# Patient Record
Sex: Female | Born: 1935
Health system: Southern US, Community
[De-identification: ages and names within clinical notes are randomized; demographics above are authoritative.]

## PROBLEM LIST (undated history)

## (undated) DIAGNOSIS — I10 Essential (primary) hypertension: Secondary | ICD-10-CM

## (undated) DIAGNOSIS — E119 Type 2 diabetes mellitus without complications: Secondary | ICD-10-CM

## (undated) DIAGNOSIS — I4891 Unspecified atrial fibrillation: Secondary | ICD-10-CM

## (undated) DIAGNOSIS — Z950 Presence of cardiac pacemaker: Secondary | ICD-10-CM

## (undated) DIAGNOSIS — IMO0002 Reserved for concepts with insufficient information to code with codable children: Secondary | ICD-10-CM

## (undated) DIAGNOSIS — E785 Hyperlipidemia, unspecified: Secondary | ICD-10-CM

## (undated) DIAGNOSIS — I2510 Atherosclerotic heart disease of native coronary artery without angina pectoris: Secondary | ICD-10-CM

## (undated) HISTORY — PX: BACK SURGERY: SHX140

## (undated) HISTORY — DX: Hyperlipidemia, unspecified: E78.5

## (undated) HISTORY — DX: Atherosclerotic heart disease of native coronary artery without angina pectoris: I25.10

## (undated) HISTORY — PX: CARDIAC SURGERY: SHX584

---

## 1960-06-27 HISTORY — PX: MITRAL VALVE REPLACEMENT: SHX147

## 1992-06-27 HISTORY — PX: AORTIC AND MITRAL VALVE REPLACEMENT: SHX5056

## 1998-06-27 HISTORY — PX: PACEMAKER INSERTION: SHX728

## 2009-06-27 HISTORY — PX: KYPHOPLASTY: SHX5884

## 2010-06-27 HISTORY — PX: KYPHOPLASTY: SHX5884

## 2011-06-28 HISTORY — PX: PACEMAKER GENERATOR CHANGE: SHX5998

## 2011-06-28 HISTORY — PX: KYPHOPLASTY: SHX5884

## 2014-07-04 DIAGNOSIS — Z7901 Long term (current) use of anticoagulants: Secondary | ICD-10-CM | POA: Diagnosis not present

## 2014-07-17 DIAGNOSIS — Z7901 Long term (current) use of anticoagulants: Secondary | ICD-10-CM | POA: Diagnosis not present

## 2014-08-01 DIAGNOSIS — Z7901 Long term (current) use of anticoagulants: Secondary | ICD-10-CM | POA: Diagnosis not present

## 2014-08-13 DIAGNOSIS — Z1231 Encounter for screening mammogram for malignant neoplasm of breast: Secondary | ICD-10-CM | POA: Diagnosis not present

## 2014-08-18 DIAGNOSIS — Z7901 Long term (current) use of anticoagulants: Secondary | ICD-10-CM | POA: Diagnosis not present

## 2014-08-22 DIAGNOSIS — H6123 Impacted cerumen, bilateral: Secondary | ICD-10-CM | POA: Diagnosis not present

## 2014-08-22 DIAGNOSIS — H903 Sensorineural hearing loss, bilateral: Secondary | ICD-10-CM | POA: Diagnosis not present

## 2014-08-28 DIAGNOSIS — Z7901 Long term (current) use of anticoagulants: Secondary | ICD-10-CM | POA: Diagnosis not present

## 2014-09-05 DIAGNOSIS — N390 Urinary tract infection, site not specified: Secondary | ICD-10-CM | POA: Diagnosis not present

## 2014-09-05 DIAGNOSIS — N813 Complete uterovaginal prolapse: Secondary | ICD-10-CM | POA: Diagnosis not present

## 2014-09-05 DIAGNOSIS — N3000 Acute cystitis without hematuria: Secondary | ICD-10-CM | POA: Diagnosis not present

## 2014-09-11 DIAGNOSIS — Z7901 Long term (current) use of anticoagulants: Secondary | ICD-10-CM | POA: Diagnosis not present

## 2014-09-14 DIAGNOSIS — Z79899 Other long term (current) drug therapy: Secondary | ICD-10-CM | POA: Diagnosis not present

## 2014-09-14 DIAGNOSIS — E119 Type 2 diabetes mellitus without complications: Secondary | ICD-10-CM | POA: Diagnosis not present

## 2014-09-14 DIAGNOSIS — I10 Essential (primary) hypertension: Secondary | ICD-10-CM | POA: Diagnosis not present

## 2014-09-14 DIAGNOSIS — E780 Pure hypercholesterolemia: Secondary | ICD-10-CM | POA: Diagnosis not present

## 2014-09-14 DIAGNOSIS — N390 Urinary tract infection, site not specified: Secondary | ICD-10-CM | POA: Diagnosis not present

## 2014-09-17 DIAGNOSIS — H25811 Combined forms of age-related cataract, right eye: Secondary | ICD-10-CM | POA: Diagnosis not present

## 2014-09-17 DIAGNOSIS — H2512 Age-related nuclear cataract, left eye: Secondary | ICD-10-CM | POA: Diagnosis not present

## 2014-09-24 DIAGNOSIS — Z7901 Long term (current) use of anticoagulants: Secondary | ICD-10-CM | POA: Diagnosis not present

## 2014-10-09 DIAGNOSIS — Z7901 Long term (current) use of anticoagulants: Secondary | ICD-10-CM | POA: Diagnosis not present

## 2014-10-17 DIAGNOSIS — Z7901 Long term (current) use of anticoagulants: Secondary | ICD-10-CM | POA: Diagnosis not present

## 2014-10-29 DIAGNOSIS — H2513 Age-related nuclear cataract, bilateral: Secondary | ICD-10-CM | POA: Diagnosis not present

## 2014-10-29 DIAGNOSIS — H2511 Age-related nuclear cataract, right eye: Secondary | ICD-10-CM | POA: Diagnosis not present

## 2014-10-29 DIAGNOSIS — H2512 Age-related nuclear cataract, left eye: Secondary | ICD-10-CM | POA: Diagnosis not present

## 2014-11-03 DIAGNOSIS — Z950 Presence of cardiac pacemaker: Secondary | ICD-10-CM | POA: Diagnosis not present

## 2014-11-03 DIAGNOSIS — Z45010 Encounter for checking and testing of cardiac pacemaker pulse generator [battery]: Secondary | ICD-10-CM | POA: Diagnosis not present

## 2014-11-10 DIAGNOSIS — Z7901 Long term (current) use of anticoagulants: Secondary | ICD-10-CM | POA: Diagnosis not present

## 2014-11-12 DIAGNOSIS — H2511 Age-related nuclear cataract, right eye: Secondary | ICD-10-CM | POA: Diagnosis not present

## 2014-12-03 DIAGNOSIS — H2512 Age-related nuclear cataract, left eye: Secondary | ICD-10-CM | POA: Diagnosis not present

## 2014-12-08 DIAGNOSIS — F329 Major depressive disorder, single episode, unspecified: Secondary | ICD-10-CM | POA: Diagnosis not present

## 2014-12-08 DIAGNOSIS — E119 Type 2 diabetes mellitus without complications: Secondary | ICD-10-CM | POA: Diagnosis not present

## 2014-12-08 DIAGNOSIS — I10 Essential (primary) hypertension: Secondary | ICD-10-CM | POA: Diagnosis not present

## 2014-12-08 DIAGNOSIS — E785 Hyperlipidemia, unspecified: Secondary | ICD-10-CM | POA: Diagnosis not present

## 2014-12-08 DIAGNOSIS — I4891 Unspecified atrial fibrillation: Secondary | ICD-10-CM | POA: Diagnosis not present

## 2014-12-08 DIAGNOSIS — R5383 Other fatigue: Secondary | ICD-10-CM | POA: Diagnosis not present

## 2014-12-16 DIAGNOSIS — N813 Complete uterovaginal prolapse: Secondary | ICD-10-CM | POA: Diagnosis not present

## 2014-12-24 DIAGNOSIS — H35372 Puckering of macula, left eye: Secondary | ICD-10-CM | POA: Diagnosis not present

## 2014-12-30 DIAGNOSIS — I4891 Unspecified atrial fibrillation: Secondary | ICD-10-CM | POA: Diagnosis not present

## 2015-01-06 DIAGNOSIS — H3531 Nonexudative age-related macular degeneration: Secondary | ICD-10-CM | POA: Diagnosis not present

## 2015-01-06 DIAGNOSIS — E119 Type 2 diabetes mellitus without complications: Secondary | ICD-10-CM | POA: Diagnosis not present

## 2015-01-06 DIAGNOSIS — H35372 Puckering of macula, left eye: Secondary | ICD-10-CM | POA: Diagnosis not present

## 2015-01-21 DIAGNOSIS — I4891 Unspecified atrial fibrillation: Secondary | ICD-10-CM | POA: Diagnosis not present

## 2015-02-19 DIAGNOSIS — Z45018 Encounter for adjustment and management of other part of cardiac pacemaker: Secondary | ICD-10-CM | POA: Diagnosis not present

## 2015-02-19 DIAGNOSIS — I480 Paroxysmal atrial fibrillation: Secondary | ICD-10-CM | POA: Diagnosis not present

## 2015-02-19 DIAGNOSIS — Z45010 Encounter for checking and testing of cardiac pacemaker pulse generator [battery]: Secondary | ICD-10-CM | POA: Diagnosis not present

## 2015-03-09 DIAGNOSIS — R5383 Other fatigue: Secondary | ICD-10-CM | POA: Diagnosis not present

## 2015-03-09 DIAGNOSIS — I38 Endocarditis, valve unspecified: Secondary | ICD-10-CM | POA: Diagnosis not present

## 2015-03-09 DIAGNOSIS — E119 Type 2 diabetes mellitus without complications: Secondary | ICD-10-CM | POA: Diagnosis not present

## 2015-03-09 DIAGNOSIS — R42 Dizziness and giddiness: Secondary | ICD-10-CM | POA: Diagnosis not present

## 2015-03-09 DIAGNOSIS — Z7901 Long term (current) use of anticoagulants: Secondary | ICD-10-CM | POA: Diagnosis not present

## 2015-03-09 DIAGNOSIS — I10 Essential (primary) hypertension: Secondary | ICD-10-CM | POA: Diagnosis not present

## 2015-04-14 DIAGNOSIS — I4891 Unspecified atrial fibrillation: Secondary | ICD-10-CM | POA: Diagnosis not present

## 2015-04-14 DIAGNOSIS — N813 Complete uterovaginal prolapse: Secondary | ICD-10-CM | POA: Diagnosis not present

## 2015-05-04 DIAGNOSIS — N813 Complete uterovaginal prolapse: Secondary | ICD-10-CM | POA: Diagnosis not present

## 2015-05-08 DIAGNOSIS — Z7901 Long term (current) use of anticoagulants: Secondary | ICD-10-CM | POA: Diagnosis not present

## 2015-05-25 DIAGNOSIS — I4891 Unspecified atrial fibrillation: Secondary | ICD-10-CM | POA: Diagnosis not present

## 2015-05-28 HISTORY — PX: KYPHOPLASTY: SHX5884

## 2015-06-02 DIAGNOSIS — M5134 Other intervertebral disc degeneration, thoracic region: Secondary | ICD-10-CM | POA: Diagnosis not present

## 2015-06-02 DIAGNOSIS — M47816 Spondylosis without myelopathy or radiculopathy, lumbar region: Secondary | ICD-10-CM | POA: Diagnosis not present

## 2015-06-02 DIAGNOSIS — M549 Dorsalgia, unspecified: Secondary | ICD-10-CM | POA: Diagnosis not present

## 2015-06-02 DIAGNOSIS — M40209 Unspecified kyphosis, site unspecified: Secondary | ICD-10-CM | POA: Diagnosis not present

## 2015-06-02 DIAGNOSIS — M545 Low back pain: Secondary | ICD-10-CM | POA: Diagnosis not present

## 2015-06-02 DIAGNOSIS — S32050A Wedge compression fracture of fifth lumbar vertebra, initial encounter for closed fracture: Secondary | ICD-10-CM | POA: Diagnosis not present

## 2015-06-02 DIAGNOSIS — M4854XA Collapsed vertebra, not elsewhere classified, thoracic region, initial encounter for fracture: Secondary | ICD-10-CM | POA: Diagnosis not present

## 2015-06-02 DIAGNOSIS — I5033 Acute on chronic diastolic (congestive) heart failure: Secondary | ICD-10-CM | POA: Diagnosis not present

## 2015-06-03 DIAGNOSIS — I5033 Acute on chronic diastolic (congestive) heart failure: Secondary | ICD-10-CM | POA: Diagnosis present

## 2015-06-03 DIAGNOSIS — Z7984 Long term (current) use of oral hypoglycemic drugs: Secondary | ICD-10-CM | POA: Diagnosis not present

## 2015-06-03 DIAGNOSIS — R791 Abnormal coagulation profile: Secondary | ICD-10-CM | POA: Diagnosis present

## 2015-06-03 DIAGNOSIS — S32050A Wedge compression fracture of fifth lumbar vertebra, initial encounter for closed fracture: Secondary | ICD-10-CM | POA: Diagnosis not present

## 2015-06-03 DIAGNOSIS — Z952 Presence of prosthetic heart valve: Secondary | ICD-10-CM | POA: Diagnosis not present

## 2015-06-03 DIAGNOSIS — Z950 Presence of cardiac pacemaker: Secondary | ICD-10-CM | POA: Diagnosis not present

## 2015-06-03 DIAGNOSIS — E785 Hyperlipidemia, unspecified: Secondary | ICD-10-CM | POA: Diagnosis present

## 2015-06-03 DIAGNOSIS — M810 Age-related osteoporosis without current pathological fracture: Secondary | ICD-10-CM | POA: Diagnosis present

## 2015-06-03 DIAGNOSIS — E119 Type 2 diabetes mellitus without complications: Secondary | ICD-10-CM | POA: Diagnosis present

## 2015-06-03 DIAGNOSIS — Z87310 Personal history of (healed) osteoporosis fracture: Secondary | ICD-10-CM | POA: Diagnosis not present

## 2015-06-03 DIAGNOSIS — I10 Essential (primary) hypertension: Secondary | ICD-10-CM | POA: Diagnosis present

## 2015-06-03 DIAGNOSIS — I272 Other secondary pulmonary hypertension: Secondary | ICD-10-CM | POA: Diagnosis present

## 2015-06-03 DIAGNOSIS — M549 Dorsalgia, unspecified: Secondary | ICD-10-CM | POA: Diagnosis not present

## 2015-06-03 DIAGNOSIS — I517 Cardiomegaly: Secondary | ICD-10-CM | POA: Diagnosis not present

## 2015-06-03 DIAGNOSIS — I482 Chronic atrial fibrillation: Secondary | ICD-10-CM | POA: Diagnosis present

## 2015-06-03 DIAGNOSIS — Z23 Encounter for immunization: Secondary | ICD-10-CM | POA: Diagnosis not present

## 2015-06-03 DIAGNOSIS — Z7901 Long term (current) use of anticoagulants: Secondary | ICD-10-CM | POA: Diagnosis not present

## 2015-06-03 DIAGNOSIS — R0602 Shortness of breath: Secondary | ICD-10-CM | POA: Diagnosis not present

## 2015-06-03 DIAGNOSIS — F419 Anxiety disorder, unspecified: Secondary | ICD-10-CM | POA: Diagnosis present

## 2015-06-03 DIAGNOSIS — M545 Low back pain: Secondary | ICD-10-CM | POA: Diagnosis not present

## 2015-06-09 DIAGNOSIS — E785 Hyperlipidemia, unspecified: Secondary | ICD-10-CM | POA: Diagnosis not present

## 2015-06-09 DIAGNOSIS — I4891 Unspecified atrial fibrillation: Secondary | ICD-10-CM | POA: Diagnosis not present

## 2015-06-09 DIAGNOSIS — G8929 Other chronic pain: Secondary | ICD-10-CM | POA: Diagnosis not present

## 2015-06-09 DIAGNOSIS — Z952 Presence of prosthetic heart valve: Secondary | ICD-10-CM | POA: Diagnosis not present

## 2015-06-09 DIAGNOSIS — I10 Essential (primary) hypertension: Secondary | ICD-10-CM | POA: Diagnosis not present

## 2015-06-09 DIAGNOSIS — E119 Type 2 diabetes mellitus without complications: Secondary | ICD-10-CM | POA: Diagnosis not present

## 2015-06-11 DIAGNOSIS — M4856XA Collapsed vertebra, not elsewhere classified, lumbar region, initial encounter for fracture: Secondary | ICD-10-CM | POA: Diagnosis not present

## 2015-06-11 DIAGNOSIS — T192XXA Foreign body in vulva and vagina, initial encounter: Secondary | ICD-10-CM | POA: Diagnosis not present

## 2015-06-11 DIAGNOSIS — R52 Pain, unspecified: Secondary | ICD-10-CM | POA: Diagnosis not present

## 2015-06-11 DIAGNOSIS — K5900 Constipation, unspecified: Secondary | ICD-10-CM | POA: Diagnosis not present

## 2015-06-11 DIAGNOSIS — R109 Unspecified abdominal pain: Secondary | ICD-10-CM | POA: Diagnosis not present

## 2015-06-12 DIAGNOSIS — M19032 Primary osteoarthritis, left wrist: Secondary | ICD-10-CM | POA: Diagnosis not present

## 2015-06-12 DIAGNOSIS — I4891 Unspecified atrial fibrillation: Secondary | ICD-10-CM | POA: Diagnosis present

## 2015-06-12 DIAGNOSIS — Z7901 Long term (current) use of anticoagulants: Secondary | ICD-10-CM | POA: Diagnosis not present

## 2015-06-12 DIAGNOSIS — K5909 Other constipation: Secondary | ICD-10-CM | POA: Diagnosis not present

## 2015-06-12 DIAGNOSIS — G8929 Other chronic pain: Secondary | ICD-10-CM | POA: Diagnosis present

## 2015-06-12 DIAGNOSIS — S32030A Wedge compression fracture of third lumbar vertebra, initial encounter for closed fracture: Secondary | ICD-10-CM | POA: Diagnosis not present

## 2015-06-12 DIAGNOSIS — I10 Essential (primary) hypertension: Secondary | ICD-10-CM | POA: Diagnosis present

## 2015-06-12 DIAGNOSIS — M19011 Primary osteoarthritis, right shoulder: Secondary | ICD-10-CM | POA: Diagnosis not present

## 2015-06-12 DIAGNOSIS — M8008XA Age-related osteoporosis with current pathological fracture, vertebra(e), initial encounter for fracture: Secondary | ICD-10-CM | POA: Diagnosis not present

## 2015-06-12 DIAGNOSIS — Z950 Presence of cardiac pacemaker: Secondary | ICD-10-CM | POA: Diagnosis not present

## 2015-06-12 DIAGNOSIS — M19012 Primary osteoarthritis, left shoulder: Secondary | ICD-10-CM | POA: Diagnosis not present

## 2015-06-12 DIAGNOSIS — M545 Low back pain: Secondary | ICD-10-CM | POA: Diagnosis present

## 2015-06-12 DIAGNOSIS — R109 Unspecified abdominal pain: Secondary | ICD-10-CM | POA: Diagnosis present

## 2015-06-12 DIAGNOSIS — I2510 Atherosclerotic heart disease of native coronary artery without angina pectoris: Secondary | ICD-10-CM | POA: Diagnosis present

## 2015-06-12 DIAGNOSIS — M4856XA Collapsed vertebra, not elsewhere classified, lumbar region, initial encounter for fracture: Secondary | ICD-10-CM | POA: Diagnosis not present

## 2015-06-12 DIAGNOSIS — K8689 Other specified diseases of pancreas: Secondary | ICD-10-CM | POA: Diagnosis not present

## 2015-06-12 DIAGNOSIS — K828 Other specified diseases of gallbladder: Secondary | ICD-10-CM | POA: Diagnosis not present

## 2015-06-12 DIAGNOSIS — N179 Acute kidney failure, unspecified: Secondary | ICD-10-CM | POA: Diagnosis not present

## 2015-06-12 DIAGNOSIS — Z952 Presence of prosthetic heart valve: Secondary | ICD-10-CM | POA: Diagnosis not present

## 2015-06-12 DIAGNOSIS — T192XXA Foreign body in vulva and vagina, initial encounter: Secondary | ICD-10-CM | POA: Diagnosis not present

## 2015-06-12 DIAGNOSIS — E119 Type 2 diabetes mellitus without complications: Secondary | ICD-10-CM | POA: Diagnosis not present

## 2015-06-12 DIAGNOSIS — S32040G Wedge compression fracture of fourth lumbar vertebra, subsequent encounter for fracture with delayed healing: Secondary | ICD-10-CM | POA: Diagnosis not present

## 2015-06-12 DIAGNOSIS — E785 Hyperlipidemia, unspecified: Secondary | ICD-10-CM | POA: Diagnosis present

## 2015-06-12 DIAGNOSIS — I959 Hypotension, unspecified: Secondary | ICD-10-CM | POA: Diagnosis present

## 2015-06-12 DIAGNOSIS — Z7984 Long term (current) use of oral hypoglycemic drugs: Secondary | ICD-10-CM | POA: Diagnosis not present

## 2015-06-12 DIAGNOSIS — K5900 Constipation, unspecified: Secondary | ICD-10-CM | POA: Diagnosis not present

## 2015-06-24 DIAGNOSIS — Z952 Presence of prosthetic heart valve: Secondary | ICD-10-CM | POA: Diagnosis not present

## 2015-06-24 DIAGNOSIS — I10 Essential (primary) hypertension: Secondary | ICD-10-CM | POA: Diagnosis not present

## 2015-06-24 DIAGNOSIS — J00 Acute nasopharyngitis [common cold]: Secondary | ICD-10-CM | POA: Diagnosis not present

## 2015-06-24 DIAGNOSIS — E119 Type 2 diabetes mellitus without complications: Secondary | ICD-10-CM | POA: Diagnosis not present

## 2015-06-24 DIAGNOSIS — M810 Age-related osteoporosis without current pathological fracture: Secondary | ICD-10-CM | POA: Diagnosis not present

## 2015-06-24 DIAGNOSIS — F329 Major depressive disorder, single episode, unspecified: Secondary | ICD-10-CM | POA: Diagnosis not present

## 2015-06-24 DIAGNOSIS — I4891 Unspecified atrial fibrillation: Secondary | ICD-10-CM | POA: Diagnosis not present

## 2015-07-02 DIAGNOSIS — I4891 Unspecified atrial fibrillation: Secondary | ICD-10-CM | POA: Diagnosis not present

## 2015-07-02 DIAGNOSIS — R634 Abnormal weight loss: Secondary | ICD-10-CM | POA: Diagnosis not present

## 2015-07-02 DIAGNOSIS — T8189XA Other complications of procedures, not elsewhere classified, initial encounter: Secondary | ICD-10-CM | POA: Diagnosis not present

## 2015-07-02 DIAGNOSIS — E1169 Type 2 diabetes mellitus with other specified complication: Secondary | ICD-10-CM | POA: Diagnosis not present

## 2015-07-09 DIAGNOSIS — R54 Age-related physical debility: Secondary | ICD-10-CM | POA: Diagnosis not present

## 2015-07-09 DIAGNOSIS — I10 Essential (primary) hypertension: Secondary | ICD-10-CM | POA: Diagnosis not present

## 2015-07-09 DIAGNOSIS — E785 Hyperlipidemia, unspecified: Secondary | ICD-10-CM | POA: Diagnosis not present

## 2015-07-09 DIAGNOSIS — I509 Heart failure, unspecified: Secondary | ICD-10-CM | POA: Diagnosis not present

## 2015-07-09 DIAGNOSIS — Z952 Presence of prosthetic heart valve: Secondary | ICD-10-CM | POA: Diagnosis not present

## 2015-07-09 DIAGNOSIS — I4891 Unspecified atrial fibrillation: Secondary | ICD-10-CM | POA: Diagnosis not present

## 2015-07-09 DIAGNOSIS — Z7901 Long term (current) use of anticoagulants: Secondary | ICD-10-CM | POA: Diagnosis not present

## 2015-07-13 DIAGNOSIS — R29890 Loss of height: Secondary | ICD-10-CM | POA: Diagnosis not present

## 2015-07-13 DIAGNOSIS — S29012A Strain of muscle and tendon of back wall of thorax, initial encounter: Secondary | ICD-10-CM | POA: Diagnosis not present

## 2015-07-13 DIAGNOSIS — E119 Type 2 diabetes mellitus without complications: Secondary | ICD-10-CM | POA: Diagnosis not present

## 2015-07-13 DIAGNOSIS — M545 Low back pain: Secondary | ICD-10-CM | POA: Diagnosis not present

## 2015-07-13 DIAGNOSIS — N813 Complete uterovaginal prolapse: Secondary | ICD-10-CM | POA: Diagnosis not present

## 2015-07-13 DIAGNOSIS — S29019A Strain of muscle and tendon of unspecified wall of thorax, initial encounter: Secondary | ICD-10-CM | POA: Diagnosis not present

## 2015-07-13 DIAGNOSIS — M4856XA Collapsed vertebra, not elsewhere classified, lumbar region, initial encounter for fracture: Secondary | ICD-10-CM | POA: Diagnosis not present

## 2015-07-13 DIAGNOSIS — E785 Hyperlipidemia, unspecified: Secondary | ICD-10-CM | POA: Diagnosis not present

## 2015-07-13 DIAGNOSIS — Z952 Presence of prosthetic heart valve: Secondary | ICD-10-CM | POA: Diagnosis not present

## 2015-07-13 DIAGNOSIS — Z950 Presence of cardiac pacemaker: Secondary | ICD-10-CM | POA: Diagnosis not present

## 2015-07-13 DIAGNOSIS — I10 Essential (primary) hypertension: Secondary | ICD-10-CM | POA: Diagnosis not present

## 2015-07-13 DIAGNOSIS — M47814 Spondylosis without myelopathy or radiculopathy, thoracic region: Secondary | ICD-10-CM | POA: Diagnosis not present

## 2015-07-15 DIAGNOSIS — I4891 Unspecified atrial fibrillation: Secondary | ICD-10-CM | POA: Diagnosis not present

## 2015-07-31 DIAGNOSIS — Z952 Presence of prosthetic heart valve: Secondary | ICD-10-CM | POA: Diagnosis not present

## 2015-07-31 DIAGNOSIS — E119 Type 2 diabetes mellitus without complications: Secondary | ICD-10-CM | POA: Diagnosis not present

## 2015-07-31 DIAGNOSIS — M810 Age-related osteoporosis without current pathological fracture: Secondary | ICD-10-CM | POA: Diagnosis not present

## 2015-07-31 DIAGNOSIS — M4856XD Collapsed vertebra, not elsewhere classified, lumbar region, subsequent encounter for fracture with routine healing: Secondary | ICD-10-CM | POA: Diagnosis not present

## 2015-07-31 DIAGNOSIS — R319 Hematuria, unspecified: Secondary | ICD-10-CM | POA: Diagnosis not present

## 2015-07-31 DIAGNOSIS — I10 Essential (primary) hypertension: Secondary | ICD-10-CM | POA: Diagnosis not present

## 2015-07-31 DIAGNOSIS — R5383 Other fatigue: Secondary | ICD-10-CM | POA: Diagnosis not present

## 2015-08-18 DIAGNOSIS — Z7901 Long term (current) use of anticoagulants: Secondary | ICD-10-CM | POA: Diagnosis not present

## 2015-08-18 DIAGNOSIS — N760 Acute vaginitis: Secondary | ICD-10-CM | POA: Diagnosis not present

## 2015-08-18 DIAGNOSIS — R159 Full incontinence of feces: Secondary | ICD-10-CM | POA: Diagnosis not present

## 2015-09-14 DIAGNOSIS — Z7901 Long term (current) use of anticoagulants: Secondary | ICD-10-CM | POA: Diagnosis not present

## 2015-10-01 DIAGNOSIS — Z7901 Long term (current) use of anticoagulants: Secondary | ICD-10-CM | POA: Diagnosis not present

## 2015-10-01 DIAGNOSIS — I509 Heart failure, unspecified: Secondary | ICD-10-CM | POA: Diagnosis not present

## 2015-10-01 DIAGNOSIS — I4891 Unspecified atrial fibrillation: Secondary | ICD-10-CM | POA: Diagnosis not present

## 2015-10-01 DIAGNOSIS — Z952 Presence of prosthetic heart valve: Secondary | ICD-10-CM | POA: Diagnosis not present

## 2015-10-01 DIAGNOSIS — E785 Hyperlipidemia, unspecified: Secondary | ICD-10-CM | POA: Diagnosis not present

## 2015-10-01 DIAGNOSIS — I10 Essential (primary) hypertension: Secondary | ICD-10-CM | POA: Diagnosis not present

## 2015-10-15 DIAGNOSIS — I509 Heart failure, unspecified: Secondary | ICD-10-CM | POA: Diagnosis not present

## 2015-10-15 DIAGNOSIS — Z952 Presence of prosthetic heart valve: Secondary | ICD-10-CM | POA: Diagnosis not present

## 2015-10-15 DIAGNOSIS — I4891 Unspecified atrial fibrillation: Secondary | ICD-10-CM | POA: Diagnosis not present

## 2015-10-15 DIAGNOSIS — Z7901 Long term (current) use of anticoagulants: Secondary | ICD-10-CM | POA: Diagnosis not present

## 2015-10-15 DIAGNOSIS — R600 Localized edema: Secondary | ICD-10-CM | POA: Diagnosis not present

## 2015-10-15 DIAGNOSIS — E785 Hyperlipidemia, unspecified: Secondary | ICD-10-CM | POA: Diagnosis not present

## 2015-10-15 DIAGNOSIS — I10 Essential (primary) hypertension: Secondary | ICD-10-CM | POA: Diagnosis not present

## 2015-10-25 DIAGNOSIS — J0100 Acute maxillary sinusitis, unspecified: Secondary | ICD-10-CM | POA: Diagnosis not present

## 2015-10-26 ENCOUNTER — Telehealth: Payer: Self-pay | Admitting: Cardiovascular Disease

## 2015-10-26 NOTE — Telephone Encounter (Signed)
Received records from [place] for appointment on 11/20/15 with Dr Sallyanne Kuster.  Records given to [facility] (medical records) for Dr Croitoru's schedule on 11/20/15. lp

## 2015-11-03 DIAGNOSIS — B373 Candidiasis of vulva and vagina: Secondary | ICD-10-CM | POA: Diagnosis not present

## 2015-11-19 ENCOUNTER — Encounter (HOSPITAL_COMMUNITY): Payer: Self-pay | Admitting: Emergency Medicine

## 2015-11-19 ENCOUNTER — Inpatient Hospital Stay (HOSPITAL_COMMUNITY)
Admission: EM | Admit: 2015-11-19 | Discharge: 2015-11-22 | DRG: 871 | Disposition: A | Discharge status: Home / Self Care | Payer: Medicare Other | Attending: Internal Medicine | Admitting: Internal Medicine

## 2015-11-19 DIAGNOSIS — E872 Acidosis, unspecified: Secondary | ICD-10-CM

## 2015-11-19 DIAGNOSIS — R05 Cough: Secondary | ICD-10-CM | POA: Diagnosis not present

## 2015-11-19 DIAGNOSIS — I482 Chronic atrial fibrillation, unspecified: Secondary | ICD-10-CM

## 2015-11-19 DIAGNOSIS — J22 Unspecified acute lower respiratory infection: Secondary | ICD-10-CM | POA: Diagnosis not present

## 2015-11-19 DIAGNOSIS — Z8249 Family history of ischemic heart disease and other diseases of the circulatory system: Secondary | ICD-10-CM | POA: Diagnosis not present

## 2015-11-19 DIAGNOSIS — D689 Coagulation defect, unspecified: Secondary | ICD-10-CM

## 2015-11-19 DIAGNOSIS — R0902 Hypoxemia: Secondary | ICD-10-CM | POA: Diagnosis present

## 2015-11-19 DIAGNOSIS — J189 Pneumonia, unspecified organism: Secondary | ICD-10-CM

## 2015-11-19 DIAGNOSIS — Z7984 Long term (current) use of oral hypoglycemic drugs: Secondary | ICD-10-CM | POA: Diagnosis not present

## 2015-11-19 DIAGNOSIS — I10 Essential (primary) hypertension: Secondary | ICD-10-CM | POA: Diagnosis present

## 2015-11-19 DIAGNOSIS — R0602 Shortness of breath: Secondary | ICD-10-CM | POA: Diagnosis not present

## 2015-11-19 DIAGNOSIS — E119 Type 2 diabetes mellitus without complications: Secondary | ICD-10-CM | POA: Diagnosis present

## 2015-11-19 DIAGNOSIS — Z7901 Long term (current) use of anticoagulants: Secondary | ICD-10-CM

## 2015-11-19 DIAGNOSIS — A419 Sepsis, unspecified organism: Principal | ICD-10-CM

## 2015-11-19 DIAGNOSIS — Z952 Presence of prosthetic heart valve: Secondary | ICD-10-CM

## 2015-11-19 DIAGNOSIS — I38 Endocarditis, valve unspecified: Secondary | ICD-10-CM | POA: Diagnosis not present

## 2015-11-19 DIAGNOSIS — Z954 Presence of other heart-valve replacement: Secondary | ICD-10-CM | POA: Diagnosis not present

## 2015-11-19 DIAGNOSIS — Z950 Presence of cardiac pacemaker: Secondary | ICD-10-CM

## 2015-11-19 HISTORY — DX: Unspecified atrial fibrillation: I48.91

## 2015-11-19 HISTORY — DX: Reserved for concepts with insufficient information to code with codable children: IMO0002

## 2015-11-19 HISTORY — DX: Presence of cardiac pacemaker: Z95.0

## 2015-11-19 HISTORY — DX: Type 2 diabetes mellitus without complications: E11.9

## 2015-11-19 HISTORY — DX: Essential (primary) hypertension: I10

## 2015-11-19 LAB — I-STAT CHEM 8, ED
BUN: 13 mg/dL (ref 6–20)
CREATININE: 0.9 mg/dL (ref 0.44–1.00)
Calcium, Ion: 1.16 mmol/L (ref 1.13–1.30)
Chloride: 97 mmol/L — ABNORMAL LOW (ref 101–111)
GLUCOSE: 196 mg/dL — AB (ref 65–99)
HEMATOCRIT: 38 % (ref 36.0–46.0)
HEMOGLOBIN: 12.9 g/dL (ref 12.0–15.0)
POTASSIUM: 4 mmol/L (ref 3.5–5.1)
Sodium: 137 mmol/L (ref 135–145)
TCO2: 24 mmol/L (ref 0–100)

## 2015-11-19 LAB — I-STAT CG4 LACTIC ACID, ED: Lactic Acid, Venous: 3.42 mmol/L (ref 0.5–2.0)

## 2015-11-19 MED ORDER — METHYLPREDNISOLONE SODIUM SUCC 125 MG IJ SOLR
125.0000 mg | Freq: Once | INTRAMUSCULAR | Status: AC
  Administered 2015-11-19: 125 mg via INTRAVENOUS
  Filled 2015-11-19: qty 2

## 2015-11-19 MED ORDER — IPRATROPIUM-ALBUTEROL 0.5-2.5 (3) MG/3ML IN SOLN
3.0000 mL | Freq: Once | RESPIRATORY_TRACT | Status: AC
  Administered 2015-11-19: 3 mL via RESPIRATORY_TRACT
  Filled 2015-11-19: qty 3

## 2015-11-19 MED ORDER — ALBUTEROL SULFATE (2.5 MG/3ML) 0.083% IN NEBU
5.0000 mg | INHALATION_SOLUTION | Freq: Once | RESPIRATORY_TRACT | Status: DC
  Filled 2015-11-19: qty 6

## 2015-11-19 MED ORDER — ALBUTEROL SULFATE (2.5 MG/3ML) 0.083% IN NEBU
2.5000 mg | INHALATION_SOLUTION | Freq: Once | RESPIRATORY_TRACT | Status: AC
  Administered 2015-11-19: 2.5 mg via RESPIRATORY_TRACT
  Filled 2015-11-19: qty 3

## 2015-11-19 MED ORDER — LEVOFLOXACIN IN D5W 500 MG/100ML IV SOLN
500.0000 mg | Freq: Once | INTRAVENOUS | Status: AC
  Administered 2015-11-19: 500 mg via INTRAVENOUS
  Filled 2015-11-19: qty 100

## 2015-11-19 NOTE — ED Provider Notes (Signed)
CSN: [id number]     Arrival date & time 11/19/15  2022 History   First MD Initiated Contact with Patient 11/19/15 2049     Chief Complaint  Patient presents with  . Shortness of Breath  . Wheezing     (Consider location/radiation/quality/duration/timing/severity/associated sxs/prior Treatment) Patient is a 80 y.o. female presenting with shortness of breath and wheezing. The history is provided by the patient (Patient complains of wheezing and shortness breath and cough. She went to her doctor today had a chest x-ray that showed pneumonia. She has been hypoxic.).  Shortness of Breath Severity:  Moderate Onset quality:  Sudden Timing:  Constant Progression:  Worsening Chronicity:  New Context: activity   Associated symptoms: wheezing   Associated symptoms: no abdominal pain, no chest pain, no cough, no headaches and no rash   Wheezing Associated symptoms: shortness of breath   Associated symptoms: no chest pain, no cough, no fatigue, no headaches and no rash     Past Medical History  Diagnosis Date  . Pacemaker   . Hypertension   . Diabetes mellitus without complication ([place])   . A-fib [facility])    Past Surgical History  Procedure Laterality Date  . Cardiac surgery     No family history on file. Social History  Substance Use Topics  . Smoking status: Never Smoker   . Smokeless tobacco: None  . Alcohol Use: No   OB History    No data available     Review of Systems  Constitutional: Negative for appetite change and fatigue.  HENT: Negative for congestion, ear discharge and sinus pressure.   Eyes: Negative for discharge.  Respiratory: Positive for shortness of breath and wheezing. Negative for cough.   Cardiovascular: Negative for chest pain.  Gastrointestinal: Negative for abdominal pain and diarrhea.  Genitourinary: Negative for frequency and hematuria.  Musculoskeletal: Negative for back pain.  Skin: Negative for rash.  Neurological: Negative for seizures and  headaches.  Psychiatric/Behavioral: Negative for hallucinations.      Allergies  Codeine  Home Medications   Prior to Admission medications   Medication Sig Start Date End Date Taking? Authorizing Provider  amitriptyline (ELAVIL) 10 MG tablet Take 10 mg by mouth at bedtime as needed for sleep. Reported on 11/19/2015 12/08/09  Yes Historical Provider, MD  amLODipine (NORVASC) 5 MG tablet Take 5 mg by mouth daily.   Yes Historical Provider, MD  metFORMIN (GLUCOPHAGE) 500 MG tablet Take 500 mg by mouth 2 (two) times daily with a meal.   Yes Historical Provider, MD  PARoxetine (PAXIL) 40 MG tablet Take 40 mg by mouth daily.  05/10/10  Yes Historical Provider, MD  pravastatin (PRAVACHOL) 80 MG tablet Take 80 mg by mouth daily. Reported on 11/19/2015   Yes Historical Provider, MD  warfarin (COUMADIN) 2 MG tablet Take 2 mg by mouth once a week. On Saturday.   Yes Historical Provider, MD  warfarin (COUMADIN) 3 MG tablet Take 3 mg by mouth as directed. Take 1 tablet (3 mg) Everyday Except on Saturdays 12/08/09  Yes Historical Provider, MD   BP 156/75 mmHg  Pulse 96  Temp(Src) 98.2 F (36.8 C) (Oral)  Resp 20  SpO2 98% Physical Exam  Constitutional: She is oriented to person, place, and time. She appears well-developed.  HENT:  Head: Normocephalic.  Eyes: Conjunctivae and EOM are normal. No scleral icterus.  Neck: Neck supple. No thyromegaly present.  Cardiovascular: Normal rate and regular rhythm.  Exam reveals no gallop and no friction rub.  No murmur heard. Pulmonary/Chest: No stridor. She has wheezes. She has no rales. She exhibits no tenderness.  Abdominal: She exhibits no distension. There is no tenderness. There is no rebound.  Musculoskeletal: Normal range of motion. She exhibits no edema.  Lymphadenopathy:    She has no cervical adenopathy.  Neurological: She is oriented to person, place, and time. She exhibits normal muscle tone. Coordination normal.  Skin: No rash noted. No  erythema.  Psychiatric: She has a normal mood and affect. Her behavior is normal.    ED Course  Procedures (including critical care time) Labs Review Labs Reviewed  I-STAT CHEM 8, ED - Abnormal; Notable for the following:    Chloride 97 (*)    Glucose, Bld 196 (*)    All other components within normal limits  I-STAT CG4 LACTIC ACID, ED - Abnormal; Notable for the following:    Lactic Acid, Venous 3.42 (*)    All other components within normal limits    Imaging Review No results found. I have personally reviewed and evaluated these images and lab results as part of my medical decision-making.   EKG Interpretation   Date/Time:  Thursday Nov 19 2015 20:34:17 EDT Ventricular Rate:  90 PR Interval:    QRS Duration: 148 QT Interval:  389 QTC Calculation: 476 R Axis:   -14 Text Interpretation:  Atrial fibrillation IVCD, consider atypical RBBB LVH  with secondary repolarization abnormality Baseline wander in lead(s) II  III aVF V3 Confirmed by Olney Monier  MD, Norissa Bartee ([id number]) on 11/19/2015 10:22:55  PM      MDM   Final diagnoses:  Chronic pneumonia ([place])    Patient with pneumonia hypoxia. Patient had chest x-ray at the office today that shows right basilar airspace disease. She had a white count that was 8 and a hemoglobin that was 14 and a PT that was 5.8. Patient will be admitted for pneumonia    Milton Ferguson, MD 11/19/15 2303

## 2015-11-19 NOTE — ED Notes (Addendum)
Pt was seen by [facility] physicians earlier today and sent to ED for evaluation of pneumonia. Pt has had "crackley cough", wheezing, and SOB x 1 week. Pt c/o productive cough with yellow sputum. Pt denies chest pain, N/VD. A&Ox4. Pt's O2 saturation dropping into the 80s. Pt had chest xray done at [facility].

## 2015-11-19 NOTE — H&P (Signed)
History and Physical    Courtney Evans [id number] DOB: Jan 09, 1936 DOA: 11/19/2015  PCP: No primary care provider on file. Patient to establish care with Dr. Theadore Nan Cardiology: Patient to establish care with Dr. Dani Gobble  Patient coming from: Urgent Care  Chief Complaint: Cough, hypoxia, shortness of breath  HPI: Courtney Evans is a 80 y.o. woman with a history of valvular heart disease S/P mechanical aortic and mitral valve replacements, chronic atrial fibrillation, chronic anticoagulation with warfarin, HTN, and DM who was referred to the ED for further evaluation and management of right basilar pneumonia associated with hypoxia and O2 requirement.  The patient is accompanied by her daughter.  She has relocated to this area from [place], [place].    She has had cough productive of yellow sputum for one week.  No report of fever.  No hemoptysis.  She has had shortness of breath the past two days.  She is not on home oxygen.  No chest pain, pressure, or tightness.  No pleurisy.  No syncope.  No nausea or vomiting.  No LUTS.  No diarrhea.  She was diagnosed with sinusitis in the past 30 days and received a prescription for amoxicillin, which she did not complete.  She took some of her leftover amoxicillin last night and this morning.  ED Course: Patient has evidence of early sepsis (HR greater than 90, hypoxia, lactic acidosis, abnormal chest xray), so she is being referred for admission.  She has received IV levaquin and solumedrol (wheezing documented upon arrival).  She also has evidence of coagulopathy, INR 5.8.  ED did not repeat testing done at urgent care today.  Those records were reviewed at bedside.  Review of Systems: As per HPI otherwise 10 point review of systems negative.   Past Medical History  Diagnosis Date  . Pacemaker   . Hypertension   . Diabetes mellitus without complication ([place])   . A-fib ([place])   . Compression fracture   Valvular heart disease  Past Surgical History    Procedure Laterality Date  . Cardiac surgery    . Aortic valve replacement    . Mitral valve replacement    . Kyphoplasty       reports that she has never smoked. She does not have any smokeless tobacco history on file. She reports that she does not drink alcohol or use illicit drugs.  She is a widow.  She has two adult daughters, who share power of attorney.  Allergies  Allergen Reactions  . Codeine Nausea Only    Family History  Problem Relation Age of Onset  . Heart attack Mother   . Stroke Father     Prior to Admission medications   Medication Sig Start Date End Date Taking? Authorizing Provider  amitriptyline (ELAVIL) 10 MG tablet Take 10 mg by mouth at bedtime as needed for sleep. Reported on 11/19/2015 12/08/09  Yes Historical Provider, MD  amLODipine (NORVASC) 5 MG tablet Take 5 mg by mouth daily.   Yes Historical Provider, MD  metFORMIN (GLUCOPHAGE) 500 MG tablet Take 500 mg by mouth 2 (two) times daily with a meal.   Yes Historical Provider, MD  PARoxetine (PAXIL) 40 MG tablet Take 40 mg by mouth daily.  05/10/10  Yes Historical Provider, MD  pravastatin (PRAVACHOL) 80 MG tablet Take 80 mg by mouth daily. Reported on 11/19/2015   Yes Historical Provider, MD  warfarin (COUMADIN) 2 MG tablet Take 2 mg by mouth once a week. On Saturday.   Yes Historical  Provider, MD  warfarin (COUMADIN) 3 MG tablet Take 3 mg by mouth as directed. Take 1 tablet (3 mg) Everyday Except on Saturdays 12/08/09  Yes Historical Provider, MD    Physical Exam: Filed Vitals:   11/19/15 2035 11/19/15 2107 11/19/15 2128 11/19/15 2348  BP: 156/75   140/71  Pulse: 94 100 96 96  Temp: 98.2 F (36.8 C)     TempSrc: Oral     Resp: 20 25 20 19   SpO2: 89% 87% 98% 92%    Constitutional: NAD, calm, comfortable Filed Vitals:   11/19/15 2035 11/19/15 2107 11/19/15 2128 11/19/15 2348  BP: 156/75   140/71  Pulse: 94 100 96 96  Temp: 98.2 F (36.8 C)     TempSrc: Oral     Resp: 20 25 20 19   SpO2: 89%  87% 98% 92%   Eyes: PERRL, lids and conjunctivae normal ENMT: Mucous membranes are moist. Posterior pharynx clear of any exudate or lesions.Normal dentition.  Neck: normal, supple Respiratory: Bilateral ronchi.  No wheezing on my exam.  Normal respiratory effort. No accessory muscle use.  Cardiovascular: Irregular, mildly tachycardic.  + mechanical click.  No extremity edema. 2+ pedal pulses.   Abdomen: no tenderness, no masses palpated. No hepatosplenomegaly. Bowel sounds positive.  Musculoskeletal: no clubbing / cyanosis. No joint deformity upper and lower extremities. Good ROM, no contractures. Normal muscle tone.  Skin: Bruise to left leg but no fluctuance or induration.  Neurologic: CN 2-12 grossly intact. Sensation intact, Strength 5/5 in all 4.  Psychiatric: Normal judgment and insight. Alert and oriented x 3. Normal mood.    Labs on Admission: I have personally reviewed following labs and imaging studies  CBC:  Recent Labs Lab 11/19/15 2200  HGB 12.9  HCT 99991111   Basic Metabolic Panel:  Recent Labs Lab 11/19/15 2200  NA 137  K 4.0  CL 97*  GLUCOSE 196*  BUN 13  CREATININE 0.90   Sepsis Labs:  Lactic acid 3.42  Radiological Exams on Admission: No results found.  Imaging done prior to arrival.  Repeat chest xray ordered for AM.  EKG: Independently reviewed. Atrial fibrillation.  J point elevation in V1.  ST depression in V6.  Assessment/Plan Principal Problem:   Sepsis due to pneumonia [facility]) Active Problems:   CAP (community acquired pneumonia)   Coagulopathy ([place])   Chronic atrial fibrillation (HCC)   Lactic acidosis   Mechanical heart valve present  Sepsis due to pneumonia --IV levaquin --Blood and sputum cultures --Urine legionella and streptococcal antigens --one liter NS bolus x 1 --Repeat lactic acid --Mucinex, IS, flutter valve --Wean oxygen as tolerated --PA/lateral chest xray in the morning  Coagulopathy with history of mechanical heart  valves --Hold warfarin tonight.  Consult pharmacy to manage.  No signs of active bleeding at this time.  Chronic atrial fibrillation --Monitor on telemetry  HTN --Continue home dose of amlodipine  DM --Hold metformin, SSI coverage AC/HS  DVT prophylaxis: Chronic anticoagulation with warfarin Code Status: FULL Family Communication: Daughter at bedside Disposition Plan: Home at discharge Consults called: NONE Admission status: Inpatient telemetry   Eber Jones MD [facility]  If 7PM-7AM, please contact night-coverage [email or web address] Password [facility]  11/19/2015, 11:54 PM

## 2015-11-20 ENCOUNTER — Inpatient Hospital Stay (HOSPITAL_COMMUNITY): Payer: Medicare Other

## 2015-11-20 ENCOUNTER — Ambulatory Visit: Payer: Medicare Other | Admitting: Cardiovascular Disease

## 2015-11-20 DIAGNOSIS — E872 Acidosis: Secondary | ICD-10-CM

## 2015-11-20 DIAGNOSIS — A419 Sepsis, unspecified organism: Principal | ICD-10-CM

## 2015-11-20 DIAGNOSIS — J189 Pneumonia, unspecified organism: Secondary | ICD-10-CM

## 2015-11-20 DIAGNOSIS — I482 Chronic atrial fibrillation: Secondary | ICD-10-CM

## 2015-11-20 DIAGNOSIS — Z954 Presence of other heart-valve replacement: Secondary | ICD-10-CM

## 2015-11-20 LAB — LACTIC ACID, PLASMA
LACTIC ACID, VENOUS: 4.4 mmol/L — AB (ref 0.5–2.0)
LACTIC ACID, VENOUS: 2.7 mmol/L — AB (ref 0.5–2.0)
Lactic Acid, Venous: 2.3 mmol/L (ref 0.5–2.0)

## 2015-11-20 LAB — GLUCOSE, CAPILLARY
GLUCOSE-CAPILLARY: 90 mg/dL (ref 65–99)
Glucose-Capillary: 199 mg/dL — ABNORMAL HIGH (ref 65–99)
Glucose-Capillary: 312 mg/dL — ABNORMAL HIGH (ref 65–99)
Glucose-Capillary: 201 mg/dL — ABNORMAL HIGH (ref 65–99)

## 2015-11-20 LAB — HEPATIC FUNCTION PANEL
ALBUMIN: 4.5 g/dL (ref 3.5–5.0)
ALT: 17 U/L (ref 14–54)
AST: 35 U/L (ref 15–41)
Alkaline Phosphatase: 84 U/L (ref 38–126)
Bilirubin, Direct: 0.2 mg/dL (ref 0.1–0.5)
Indirect Bilirubin: 1 mg/dL — ABNORMAL HIGH (ref 0.3–0.9)
Total Bilirubin: 1.2 mg/dL (ref 0.3–1.2)
Total Protein: 7.5 g/dL (ref 6.5–8.1)

## 2015-11-20 LAB — CBC
HEMATOCRIT: 37.9 % (ref 36.0–46.0)
Hemoglobin: 12.6 g/dL (ref 12.0–15.0)
MCH: 31.1 pg (ref 26.0–34.0)
MCHC: 33.2 g/dL (ref 30.0–36.0)
MCV: 93.6 fL (ref 78.0–100.0)
Platelets: 181 10*3/uL (ref 150–400)
RBC: 4.05 MIL/uL (ref 3.87–5.11)
RDW: 14.3 % (ref 11.5–15.5)
WBC: 6.5 10*3/uL (ref 4.0–10.5)

## 2015-11-20 LAB — PROTIME-INR
INR: 3.88 — AB (ref 0.00–1.49)
PROTHROMBIN TIME: 37.1 s — AB (ref 11.6–15.2)

## 2015-11-20 LAB — I-STAT CG4 LACTIC ACID, ED: LACTIC ACID, VENOUS: 2.64 mmol/L — AB (ref 0.5–2.0)

## 2015-11-20 LAB — BASIC METABOLIC PANEL
Anion gap: 9 (ref 5–15)
BUN: 13 mg/dL (ref 6–20)
CALCIUM: 9.2 mg/dL (ref 8.9–10.3)
CO2: 25 mmol/L (ref 22–32)
CREATININE: 0.85 mg/dL (ref 0.44–1.00)
Chloride: 100 mmol/L — ABNORMAL LOW (ref 101–111)
GFR calc non Af Amer: >60 mL/min (ref >60)
Glucose, Bld: 180 mg/dL — ABNORMAL HIGH (ref 65–99)
Potassium: 4.1 mmol/L (ref 3.5–5.1)
SODIUM: 134 mmol/L — AB (ref 135–145)

## 2015-11-20 LAB — HIV ANTIBODY (ROUTINE TESTING W REFLEX): HIV Screen 4th Generation wRfx: NONREACTIVE

## 2015-11-20 LAB — STREP PNEUMONIAE URINARY ANTIGEN: STREP PNEUMO URINARY ANTIGEN: NEGATIVE

## 2015-11-20 LAB — PROCALCITONIN

## 2015-11-20 MED ORDER — AMITRIPTYLINE HCL 10 MG PO TABS
10.0000 mg | ORAL_TABLET | Freq: Every evening | ORAL | Status: DC | PRN
  Administered 2015-11-20: 10 mg via ORAL
  Filled 2015-11-20 (×2): qty 1

## 2015-11-20 MED ORDER — SODIUM CHLORIDE 0.9 % IV BOLUS (SEPSIS)
500.0000 mL | Freq: Once | INTRAVENOUS | Status: AC
  Administered 2015-11-20: 500 mL via INTRAVENOUS

## 2015-11-20 MED ORDER — WARFARIN - PHARMACIST DOSING INPATIENT: Freq: Every day | Status: DC

## 2015-11-20 MED ORDER — PAROXETINE HCL 20 MG PO TABS
40.0000 mg | ORAL_TABLET | Freq: Every day | ORAL | Status: DC
  Administered 2015-11-20 – 2015-11-22 (×3): 40 mg via ORAL
  Filled 2015-11-20 (×3): qty 2

## 2015-11-20 MED ORDER — DEXTROSE 5 % IV SOLN
1.0000 g | Freq: Every day | INTRAVENOUS | Status: DC
  Administered 2015-11-20 – 2015-11-21 (×2): 1 g via INTRAVENOUS
  Filled 2015-11-20 (×3): qty 10

## 2015-11-20 MED ORDER — GUAIFENESIN ER 600 MG PO TB12
600.0000 mg | ORAL_TABLET | Freq: Two times a day (BID) | ORAL | Status: DC
  Administered 2015-11-20 – 2015-11-22 (×6): 600 mg via ORAL
  Filled 2015-11-20 (×6): qty 1

## 2015-11-20 MED ORDER — LEVOFLOXACIN IN D5W 750 MG/150ML IV SOLN: 750.0000 mg | INTRAVENOUS | Status: DC

## 2015-11-20 MED ORDER — SODIUM CHLORIDE 0.9 % IV BOLUS (SEPSIS)
1000.0000 mL | Freq: Once | INTRAVENOUS | Status: AC
  Administered 2015-11-20: 1000 mL via INTRAVENOUS

## 2015-11-20 MED ORDER — INSULIN ASPART 100 UNIT/ML ~~LOC~~ SOLN
0.0000 [IU] | Freq: Three times a day (TID) | SUBCUTANEOUS | Status: DC
  Administered 2015-11-20: 11 [IU] via SUBCUTANEOUS
  Administered 2015-11-20: 3 [IU] via SUBCUTANEOUS
  Administered 2015-11-21 (×2): 2 [IU] via SUBCUTANEOUS
  Administered 2015-11-22: 3 [IU] via SUBCUTANEOUS

## 2015-11-20 MED ORDER — PRAVASTATIN SODIUM 40 MG PO TABS
80.0000 mg | ORAL_TABLET | Freq: Every day | ORAL | Status: DC
  Administered 2015-11-20 – 2015-11-22 (×3): 80 mg via ORAL
  Filled 2015-11-20 (×4): qty 2

## 2015-11-20 MED ORDER — DEXTROSE 5 % IV SOLN
500.0000 mg | INTRAVENOUS | Status: DC
  Administered 2015-11-20 – 2015-11-21 (×2): 500 mg via INTRAVENOUS
  Filled 2015-11-20 (×2): qty 500

## 2015-11-20 MED ORDER — SODIUM CHLORIDE 0.9 % IV BOLUS (SEPSIS): 1000.0000 mL | Freq: Once | INTRAVENOUS | Status: DC

## 2015-11-20 MED ORDER — AMLODIPINE BESYLATE 5 MG PO TABS
5.0000 mg | ORAL_TABLET | Freq: Every day | ORAL | Status: DC
  Administered 2015-11-20 – 2015-11-22 (×3): 5 mg via ORAL
  Filled 2015-11-20 (×3): qty 1

## 2015-11-20 MED ORDER — ALBUTEROL SULFATE (2.5 MG/3ML) 0.083% IN NEBU: 2.5000 mg | INHALATION_SOLUTION | RESPIRATORY_TRACT | Status: DC | PRN

## 2015-11-20 NOTE — Progress Notes (Signed)
ANTICOAGULATION CONSULT NOTE - Initial Consult  Pharmacy Consult for warfarin Indication: atrial fibrillation, S/P mechanical aortic and mitral valve replacements  Allergies  Allergen Reactions  . Codeine Nausea Only    Patient Measurements: Height: 5\' 4"  (162.6 cm) Weight: 133 lb 9.6 oz (60.601 kg) IBW/kg (Calculated) : 54.7 Heparin Dosing Weight:   Vital Signs: Temp: 97.4 F (36.3 C) (05/26 0540) Temp Source: Oral (05/26 0540) BP: 144/85 mmHg (05/26 0540) Pulse Rate: 94 (05/26 0540)  Labs:  Recent Labs  11/19/15 2200 11/20/15 0111  HGB 12.9 12.6  HCT 38.0 37.9  PLT  --  181  LABPROT  --  37.1*  INR  --  3.88*  CREATININE 0.90 0.85    Estimated Creatinine Clearance: 47.9 mL/min (by C-G formula based on Cr of 0.85).   Medical History: Past Medical History  Diagnosis Date  . Pacemaker   . Hypertension   . Diabetes mellitus without complication ([place])   . A-fib ([place])   . Compression fracture     Medications:  Prescriptions prior to admission  Medication Sig Dispense Refill Last Dose  . amitriptyline (ELAVIL) 10 MG tablet Take 10 mg by mouth at bedtime as needed for sleep. Reported on 11/19/2015   11/18/2015 at Unknown time  . amLODipine (NORVASC) 5 MG tablet Take 5 mg by mouth daily.   11/18/2015 at Unknown time  . metFORMIN (GLUCOPHAGE) 500 MG tablet Take 500 mg by mouth 2 (two) times daily with a meal.   11/18/2015 at Unknown time  . PARoxetine (PAXIL) 40 MG tablet Take 40 mg by mouth daily.    11/18/2015 at Unknown time  . pravastatin (PRAVACHOL) 80 MG tablet Take 80 mg by mouth daily. Reported on 11/19/2015   11/18/2015 at Unknown time  . warfarin (COUMADIN) 2 MG tablet Take 2 mg by mouth once a week. On Saturday.   11/14/2015 at unknown time  . warfarin (COUMADIN) 3 MG tablet Take 3 mg by mouth as directed. Take 1 tablet (3 mg) Everyday Except on Saturdays   11/18/2015 at Unknown time   Scheduled:  . amLODipine  5 mg Oral Daily  . guaiFENesin  600 mg Oral BID   . insulin aspart  0-15 Units Subcutaneous TID WC  . levofloxacin (LEVAQUIN) IV  750 mg Intravenous Q24H  . PARoxetine  40 mg Oral Daily  . pravastatin  80 mg Oral Daily    Assessment: Patient with afib and mechanical heart valve.  INR > 3.5 on admit.  Last dose warfarin noted 5/24  Goal of Therapy:  INR 2.5-3.5  Plan:  No warfarin 5/26 Daily INR  Nani Skillern Crowford 11/20/2015,6:49 AM

## 2015-11-20 NOTE — Progress Notes (Signed)
CRITICAL VALUE ALERT  Critical value received:  Lactic acid  Date of notification:  11/20/15  Time of notification: 11:15am Critical value read back:yes  Nurse who received alert: grace Twyla Dais rn  MD notified (1st page):  Dr. Aileen Fass  Time of first page:  11:16am  MD notified (2nd page):  Time of second page:  Responding MD:  Dr. Aileen Fass  Time MD responded:  11:16am

## 2015-11-20 NOTE — Progress Notes (Signed)
[facility] PROGRESS NOTE    Progress Note  Courtney Evans  [id number] DOB: 1935-12-06 DOA: 11/19/2015 PCP: No primary care provider on file.     Brief Narrative:   Courtney Evans is an 80 y.o. female with An sepsis  Assessment/Plan:   Sepsis due to pneumonia (HCC)/CAP (community acquired pneumonia) Vital signs remain stable, she has only received a liter of IV fluid lactic acid is trending down we'll repeat lactic acid. She was started empirically on antibiotics we'll DC IV Levaquin started on Rocephin and azithromycin.  Coagulopathy (HCC) history of mechanical mitral valve Chronic atrial fibrillation ([place]): Goal INR is 2.5-3.5. Continue Coumadin per pharmacy.   DVT prophylaxis: coumadin Family Communication:none Disposition Plan/Barrier to D/C: home in 2-3 days Code Status:     Code Status Orders        Start     Ordered   11/20/15 0104  Full code   Continuous     11/20/15 0103    Code Status History    Date Active Date Inactive Code Status Order ID Comments User Context   This patient has a current code status but no historical code status.        IV Access:    Peripheral IV   Procedures and diagnostic studies:   No results found.   Medical Consultants:    None.  Anti-Infectives:   Rocephin and azithromycin started on 5 26,017.  Subjective:    Courtney Evans patient relates that she feels much better than yesterday.  Objective:    Filed Vitals:   11/19/15 2348 11/20/15 0044 11/20/15 0330 11/20/15 0540  BP: 140/71 153/99 123/54 144/85  Pulse: 96 100 93 94  Temp:  99.5 F (37.5 C) 98.7 F (37.1 C) 97.4 F (36.3 C)  TempSrc:  Oral Oral Oral  Resp: 19 20 20 18   Height:  5\' 4"  (1.626 m)    Weight:  60.601 kg (133 lb 9.6 oz)    SpO2: 92% 93% 95% 97%    Intake/Output Summary (Last 24 hours) at 11/20/15 0818 Last data filed at 11/20/15 0616  Gross per 24 hour  Intake   1000 ml  Output    300 ml  Net    700 ml   Filed  Weights   11/20/15 0044  Weight: 60.601 kg (133 lb 9.6 oz)    Exam: General exam: In no acute distress. Respiratory system: Good air movement and clear to auscultation. Cardiovascular system: S1 & S2 heard, RRR. No JVD. Gastrointestinal system: Abdomen is nondistended, soft and nontender.  Central nervous system: Alert and oriented. No focal neurological deficits. Extremities: No pedal edema. Skin: No rashes, lesions or ulcers Psychiatry: Judgement and insight appear normal. Mood & affect appropriate.    Data Reviewed:    Labs: Basic Metabolic Panel:  Recent Labs Lab 11/19/15 2200 11/20/15 0111  NA 137 134*  K 4.0 4.1  CL 97* 100*  CO2  --  25  GLUCOSE 196* 180*  BUN 13 13  CREATININE 0.90 0.85  CALCIUM  --  9.2   GFR Estimated Creatinine Clearance: 47.9 mL/min (by C-G formula based on Cr of 0.85). Liver Function Tests:  Recent Labs Lab 11/20/15 0111  AST 35  ALT 17  ALKPHOS 84  BILITOT 1.2  PROT 7.5  ALBUMIN 4.5   No results for input(s): LIPASE, AMYLASE in the last 168 hours. No results for input(s): AMMONIA in the last 168 hours. Coagulation profile  Recent Labs Lab 11/20/15 0111  INR 3.88*  CBC:  Recent Labs Lab 11/19/15 2200 11/20/15 0111  WBC  --  6.5  HGB 12.9 12.6  HCT 38.0 37.9  MCV  --  93.6  PLT  --  181   Cardiac Enzymes: No results for input(s): CKTOTAL, CKMB, CKMBINDEX, TROPONINI in the last 168 hours. BNP (last 3 results) No results for input(s): PROBNP in the last 8760 hours. CBG:  Recent Labs Lab 11/20/15 0738  GLUCAP 199*   D-Dimer: No results for input(s): DDIMER in the last 72 hours. Hgb A1c: No results for input(s): HGBA1C in the last 72 hours. Lipid Profile: No results for input(s): CHOL, HDL, LDLCALC, TRIG, CHOLHDL, LDLDIRECT in the last 72 hours. Thyroid function studies: No results for input(s): TSH, T4TOTAL, T3FREE, THYROIDAB in the last 72 hours.  Invalid input(s): FREET3 Anemia work up: No  results for input(s): VITAMINB12, FOLATE, FERRITIN, TIBC, IRON, RETICCTPCT in the last 72 hours. Sepsis Labs:  Recent Labs Lab 11/19/15 2200 11/20/15 0038 11/20/15 0111  PROCALCITON  --   --  <0.10  WBC  --   --  6.5  LATICACIDVEN 3.42* 2.64*  --    Microbiology No results found for this or any previous visit (from the past 240 hour(s)).   Medications:   . amLODipine  5 mg Oral Daily  . azithromycin  500 mg Intravenous Q24H  . cefTRIAXone (ROCEPHIN)  IV  1 g Intravenous Q24H  . guaiFENesin  600 mg Oral BID  . insulin aspart  0-15 Units Subcutaneous TID WC  . PARoxetine  40 mg Oral Daily  . pravastatin  80 mg Oral Daily  . Warfarin - Pharmacist Dosing Inpatient   Does not apply q1800   Continuous Infusions:   Time spent: 25 min   LOS: 1 day   Courtney Evans  [facility] Pager [phone]  *Please refer to [email or web address], password [email or web address] to get updated schedule on who will round on this patient, as hospitalists switch teams weekly. If 7PM-7AM, please contact night-coverage at [email or web address], password [email or web address] for any overnight needs.  11/20/2015, 8:18 AM

## 2015-11-20 NOTE — Progress Notes (Signed)
CRITICAL VALUE ALERT  Critical value received: lactic acid Date of notification: 11/20/15  Time of notification:  [id number] Critical value read back: yes  Nurse who received alert:  Aldean Jewett RN  MD notified (1st page): Dr. Aileen Fass  Time of first page:  1716  MD notified (2nd page):  Time of second page:  Responding MD:    Time MD responded:

## 2015-11-21 DIAGNOSIS — D689 Coagulation defect, unspecified: Secondary | ICD-10-CM

## 2015-11-21 LAB — EXPECTORATED SPUTUM ASSESSMENT W REFEX TO RESP CULTURE

## 2015-11-21 LAB — PROTIME-INR
INR: 2.64 — ABNORMAL HIGH (ref 0.00–1.49)
PROTHROMBIN TIME: 27.8 s — AB (ref 11.6–15.2)

## 2015-11-21 LAB — GLUCOSE, CAPILLARY
GLUCOSE-CAPILLARY: 147 mg/dL — AB (ref 65–99)
GLUCOSE-CAPILLARY: 118 mg/dL — AB (ref 65–99)
Glucose-Capillary: 139 mg/dL — ABNORMAL HIGH (ref 65–99)
Glucose-Capillary: 94 mg/dL (ref 65–99)

## 2015-11-21 LAB — EXPECTORATED SPUTUM ASSESSMENT W GRAM STAIN, RFLX TO RESP C

## 2015-11-21 MED ORDER — WARFARIN SODIUM 3 MG PO TABS
3.0000 mg | ORAL_TABLET | Freq: Once | ORAL | Status: AC
Start: 2015-11-21 — End: 2015-11-21
  Administered 2015-11-21: 3 mg via ORAL
  Filled 2015-11-21: qty 1

## 2015-11-21 NOTE — Progress Notes (Signed)
ANTICOAGULATION CONSULT NOTE - Initial Consult  Pharmacy Consult for warfarin Indication: atrial fibrillation, S/P mechanical aortic and mitral valve replacements  Allergies  Allergen Reactions  . Codeine Nausea Only    Patient Measurements: Height: 5\' 4"  (162.6 cm) Weight: 133 lb 9.6 oz (60.601 kg) IBW/kg (Calculated) : 54.7  Vital Signs: Temp: 98.2 F (36.8 C) (05/27 0458) Temp Source: Oral (05/27 0458) BP: 149/76 mmHg (05/27 0458) Pulse Rate: 64 (05/27 0458)  Labs:  Recent Labs  11/19/15 2200 11/20/15 0111 11/21/15 0516  HGB 12.9 12.6  --   HCT 38.0 37.9  --   PLT  --  181  --   LABPROT  --  37.1* 27.8*  INR  --  3.88* 2.64*  CREATININE 0.90 0.85  --     Estimated Creatinine Clearance: 47.9 mL/min (by C-G formula based on Cr of 0.85).   Medical History: Past Medical History  Diagnosis Date  . Pacemaker   . Hypertension   . Diabetes mellitus without complication ([place])   . A-fib ([place])   . Compression fracture     Medications:  Prescriptions prior to admission  Medication Sig Dispense Refill Last Dose  . amitriptyline (ELAVIL) 10 MG tablet Take 10 mg by mouth at bedtime as needed for sleep. Reported on 11/19/2015   11/18/2015 at Unknown time  . amLODipine (NORVASC) 5 MG tablet Take 5 mg by mouth daily.   11/18/2015 at Unknown time  . metFORMIN (GLUCOPHAGE) 500 MG tablet Take 500 mg by mouth 2 (two) times daily with a meal.   11/18/2015 at Unknown time  . PARoxetine (PAXIL) 40 MG tablet Take 40 mg by mouth daily.    11/18/2015 at Unknown time  . pravastatin (PRAVACHOL) 80 MG tablet Take 80 mg by mouth daily. Reported on 11/19/2015   11/18/2015 at Unknown time  . warfarin (COUMADIN) 2 MG tablet Take 2 mg by mouth once a week. On Saturday.   11/14/2015 at unknown time  . warfarin (COUMADIN) 3 MG tablet Take 3 mg by mouth as directed. Take 1 tablet (3 mg) Everyday Except on Saturdays   11/18/2015 at Unknown time   Scheduled:  . amLODipine  5 mg Oral Daily  .  azithromycin  500 mg Intravenous Q24H  . cefTRIAXone (ROCEPHIN)  IV  1 g Intravenous Daily  . guaiFENesin  600 mg Oral BID  . insulin aspart  0-15 Units Subcutaneous TID WC  . PARoxetine  40 mg Oral Daily  . pravastatin  80 mg Oral Daily  . Warfarin - Pharmacist Dosing Inpatient   Does not apply q1800    Assessment: 80 y.o. woman with a history of valvular heart disease S/P mechanical aortic and mitral valve replacements, chronic atrial fibrillation, on chronic warfarin. Admitted 5/25 with pneumonia.  Home dosage of warfarin reported as 3mg  daily except 2mg  on Saturdays.  Today, 11/21/2015   INR therapeutic (2.64) but decreased overnight - dose was held as INR > 3.5  No bleeding documented, no CBC today  Thin liquid diet ordered  Drug interactions: received one dose of levaquin 5/26, now on rocephin/zithromax which may result in increased warfarin sensitivity  Goal of Therapy:  INR 2.5-3.5  Plan:   Warfarin 3mg  PO x 1 tonight  Daily PT/INR  Peggyann Juba, PharmD, BCPS Pager: [phone] 11/21/2015,9:39 AM

## 2015-11-21 NOTE — Progress Notes (Signed)
CRITICAL VALUE ALERT  Critical value received:  Lactic Acid 2.3  Date of notification:  11/20/15  Time of notification:  21:20  Critical value read back:Yes.    Nurse who received alert:  Ruben Im, RN  MD notified (1st page):  [place]  Time of first page:  21:25  MD notified (2nd page):  Time of second page:  Responding MD:  [place]  Time MD responded:  21:30 No new orders. Will continue to monitor patient.

## 2015-11-21 NOTE — Progress Notes (Signed)
[facility] PROGRESS NOTE    Progress Note  Courtney Evans  [id number] DOB: [birth year] DOA: 11/19/2015 PCP: No primary care provider on file.     Brief Narrative:   Courtney Evans is an 80 y.o. female with An sepsis  Assessment/Plan:   Sepsis due to pneumonia (HCC)/CAP (community acquired pneumonia) Vital signs remain stable, she has only received a liter of IV fluid lactic acid is trending down. Past afebrile breathing is improved Deescalated antibiotic regimen to oral Levaquin.  Coagulopathy (HCC) history of mechanical mitral valve Chronic atrial fibrillation ([place]): Goal INR is 2.5-3.5. Continue Coumadin per pharmacy.   DVT prophylaxis: coumadin Family Communication:none Disposition Plan/Barrier to D/C: home in am Code Status:     Code Status Orders        Start     Ordered   11/20/15 0104  Full code   Continuous     11/20/15 0103    Code Status History    Date Active Date Inactive Code Status Order ID Comments User Context   This patient has a current code status but no historical code status.        IV Access:    Peripheral IV   Procedures and diagnostic studies:   Dg Chest 2 View  11/20/2015  CLINICAL DATA:  Cough, shortness of Breath EXAM: CHEST  2 VIEW COMPARISON:  None FINDINGS: Borderline cardiomegaly. Status post median sternotomy. Single lead cardiac pacemaker in place. Diffuse osteopenia is noted. Mild compression deformities mid and lower thoracic spine of indeterminate age. Clinical correlation is necessary. Prior vertebroplasty lumbar spine. No pulmonary edema. There is tiny right pleural effusion with right base atelectasis or infiltrate. IMPRESSION: Borderline cardiomegaly. Status post median sternotomy. Tiny right pleural effusion with right basilar atelectasis or infiltrate. No pulmonary edema. Thoracic spine osteopenia. Multiple thoracic spine compression deformities of indeterminate age. Clinical correlation is necessary.  Electronically Signed   By: Lahoma Crocker M.D.   On: 11/20/2015 09:50     Medical Consultants:    None.  Anti-Infectives:   Rocephin and azithromycin started on 5 26,017.  Subjective:    Courtney Evans shortness of breath resolved.  Objective:    Filed Vitals:   11/20/15 0947 11/20/15 1401 11/20/15 2050 11/21/15 0458  BP: 140/62 137/74 149/91 149/76  Pulse: 86 77 91 64  Temp: 97.9 F (36.6 C) 99 F (37.2 C) 98 F (36.7 C) 98.2 F (36.8 C)  TempSrc: Oral Oral Oral Oral  Resp: 16 20 20 18   Height:      Weight:      SpO2: 98% 94% 95% 97%    Intake/Output Summary (Last 24 hours) at 11/21/15 1003 Last data filed at 11/21/15 0955  Gross per 24 hour  Intake    490 ml  Output      0 ml  Net    490 ml   Filed Weights   11/20/15 0044  Weight: 60.601 kg (133 lb 9.6 oz)    Exam: General exam: In no acute distress. Respiratory system: Good air movement and clear to auscultation. Cardiovascular system: S1 & S2 heard, RRR. No JVD. Gastrointestinal system: Abdomen is nondistended, soft and nontender.  Central nervous system: Alert and oriented. No focal neurological deficits. Extremities: No pedal edema. Skin: No rashes, lesions or ulcers Psychiatry: Judgement and insight appear normal. Mood & affect appropriate.    Data Reviewed:    Labs: Basic Metabolic Panel:  Recent Labs Lab 11/19/15 2200 11/20/15 0111  NA 137 134*  K 4.0 4.1  CL 97* 100*  CO2  --  25  GLUCOSE 196* 180*  BUN 13 13  CREATININE 0.90 0.85  CALCIUM  --  9.2   GFR Estimated Creatinine Clearance: 47.9 mL/min (by C-G formula based on Cr of 0.85). Liver Function Tests:  Recent Labs Lab 11/20/15 0111  AST 35  ALT 17  ALKPHOS 84  BILITOT 1.2  PROT 7.5  ALBUMIN 4.5   No results for input(s): LIPASE, AMYLASE in the last 168 hours. No results for input(s): AMMONIA in the last 168 hours. Coagulation profile  Recent Labs Lab 11/20/15 0111 11/21/15 0516  INR 3.88* 2.64*     CBC:  Recent Labs Lab 11/19/15 2200 11/20/15 0111  WBC  --  6.5  HGB 12.9 12.6  HCT 38.0 37.9  MCV  --  93.6  PLT  --  181   Cardiac Enzymes: No results for input(s): CKTOTAL, CKMB, CKMBINDEX, TROPONINI in the last 168 hours. BNP (last 3 results) No results for input(s): PROBNP in the last 8760 hours. CBG:  Recent Labs Lab 11/20/15 0738 11/20/15 1212 11/20/15 1718 11/20/15 2042 11/21/15 0746  GLUCAP 199* 312* 90 201* 139*   D-Dimer: No results for input(s): DDIMER in the last 72 hours. Hgb A1c: No results for input(s): HGBA1C in the last 72 hours. Lipid Profile: No results for input(s): CHOL, HDL, LDLCALC, TRIG, CHOLHDL, LDLDIRECT in the last 72 hours. Thyroid function studies: No results for input(s): TSH, T4TOTAL, T3FREE, THYROIDAB in the last 72 hours.  Invalid input(s): FREET3 Anemia work up: No results for input(s): VITAMINB12, FOLATE, FERRITIN, TIBC, IRON, RETICCTPCT in the last 72 hours. Sepsis Labs:  Recent Labs Lab 11/20/15 0038 11/20/15 0111 11/20/15 1000 11/20/15 1642 11/20/15 2013  PROCALCITON  --  <0.10  --   --   --   WBC  --  6.5  --   --   --   LATICACIDVEN 2.64*  --  4.4* 2.7* 2.3*   Microbiology No results found for this or any previous visit (from the past 240 hour(s)).   Medications:   . amLODipine  5 mg Oral Daily  . azithromycin  500 mg Intravenous Q24H  . cefTRIAXone (ROCEPHIN)  IV  1 g Intravenous Daily  . guaiFENesin  600 mg Oral BID  . insulin aspart  0-15 Units Subcutaneous TID WC  . PARoxetine  40 mg Oral Daily  . pravastatin  80 mg Oral Daily  . warfarin  3 mg Oral ONCE-1800  . Warfarin - Pharmacist Dosing Inpatient   Does not apply q1800   Continuous Infusions:   Time spent: 15 min   LOS: 2 days   [name]  [facility] Pager [phone]  *Please refer to [email or web address], password [email or web address] to get updated schedule on who will round on this patient, as hospitalists switch teams weekly. If 7PM-7AM,  please contact night-coverage at [email or web address], password [email or web address] for any overnight needs.  11/21/2015, 10:03 AM

## 2015-11-22 LAB — PROTIME-INR
INR: 1.82 — ABNORMAL HIGH (ref 0.00–1.49)
Prothrombin Time: 21 seconds — ABNORMAL HIGH (ref 11.6–15.2)

## 2015-11-22 LAB — GLUCOSE, CAPILLARY
GLUCOSE-CAPILLARY: 177 mg/dL — AB (ref 65–99)
Glucose-Capillary: 91 mg/dL (ref 65–99)

## 2015-11-22 LAB — LEGIONELLA PNEUMOPHILA SEROGP 1 UR AG: L. pneumophila Serogp 1 Ur Ag: NEGATIVE

## 2015-11-22 MED ORDER — ENOXAPARIN SODIUM 60 MG/0.6ML ~~LOC~~ SOLN: 1.5000 mg/kg | SUBCUTANEOUS | Status: DC

## 2015-11-22 MED ORDER — ENOXAPARIN SODIUM 60 MG/0.6ML ~~LOC~~ SOLN
60.0000 mg | Freq: Two times a day (BID) | SUBCUTANEOUS | Status: DC
  Administered 2015-11-22: 60 mg via SUBCUTANEOUS
  Filled 2015-11-22: qty 0.6

## 2015-11-22 MED ORDER — ENOXAPARIN SODIUM 60 MG/0.6ML ~~LOC~~ SOLN: 1.5000 mg/kg | Freq: Two times a day (BID) | SUBCUTANEOUS | Status: DC

## 2015-11-22 MED ORDER — LEVOFLOXACIN 500 MG PO TABS: 500.0000 mg | ORAL_TABLET | Freq: Every day | ORAL | Status: DC

## 2015-11-22 MED ORDER — WARFARIN SODIUM 5 MG PO TABS
5.0000 mg | ORAL_TABLET | Freq: Once | ORAL | Status: AC
  Administered 2015-11-22: 5 mg via ORAL
  Filled 2015-11-22: qty 1

## 2015-11-22 NOTE — Progress Notes (Addendum)
ANTICOAGULATION CONSULT NOTE - Initial Consult  Pharmacy Consult for warfarin Indication: atrial fibrillation, S/P mechanical aortic and mitral valve replacements  Allergies  Allergen Reactions  . Codeine Nausea Only    Patient Measurements: Height: 5\' 4"  (162.6 cm) Weight: 133 lb 9.6 oz (60.601 kg) IBW/kg (Calculated) : 54.7  Vital Signs: Temp: 98.3 F (36.8 C) (05/28 0508) Temp Source: Oral (05/28 0508) BP: 156/68 mmHg (05/28 0508) Pulse Rate: 72 (05/28 0508)  Labs:  Recent Labs  11/19/15 2200 11/20/15 0111 11/21/15 0516 11/22/15 0515  HGB 12.9 12.6  --   --   HCT 38.0 37.9  --   --   PLT  --  181  --   --   LABPROT  --  37.1* 27.8* 21.0*  INR  --  3.88* 2.64* 1.82*  CREATININE 0.90 0.85  --   --     Estimated Creatinine Clearance: 47.9 mL/min (by C-G formula based on Cr of 0.85).   Medical History: Past Medical History  Diagnosis Date  . Pacemaker   . Hypertension   . Diabetes mellitus without complication ([place])   . A-fib ([place])   . Compression fracture     Medications:  Prescriptions prior to admission  Medication Sig Dispense Refill Last Dose  . amitriptyline (ELAVIL) 10 MG tablet Take 10 mg by mouth at bedtime as needed for sleep. Reported on 11/19/2015   11/18/2015 at Unknown time  . amLODipine (NORVASC) 5 MG tablet Take 5 mg by mouth daily.   11/18/2015 at Unknown time  . metFORMIN (GLUCOPHAGE) 500 MG tablet Take 500 mg by mouth 2 (two) times daily with a meal.   11/18/2015 at Unknown time  . PARoxetine (PAXIL) 40 MG tablet Take 40 mg by mouth daily.    11/18/2015 at Unknown time  . pravastatin (PRAVACHOL) 80 MG tablet Take 80 mg by mouth daily. Reported on 11/19/2015   11/18/2015 at Unknown time  . warfarin (COUMADIN) 2 MG tablet Take 2 mg by mouth once a week. On Saturday.   11/14/2015 at unknown time  . warfarin (COUMADIN) 3 MG tablet Take 3 mg by mouth as directed. Take 1 tablet (3 mg) Everyday Except on Saturdays   11/18/2015 at Unknown time    Scheduled:  . amLODipine  5 mg Oral Daily  . azithromycin  500 mg Intravenous Q24H  . cefTRIAXone (ROCEPHIN)  IV  1 g Intravenous Daily  . guaiFENesin  600 mg Oral BID  . insulin aspart  0-15 Units Subcutaneous TID WC  . PARoxetine  40 mg Oral Daily  . pravastatin  80 mg Oral Daily  . Warfarin - Pharmacist Dosing Inpatient   Does not apply q1800    Assessment: 80 y.o. woman with a history of valvular heart disease S/P mechanical aortic and mitral valve replacements, chronic atrial fibrillation, on chronic warfarin. Admitted 5/25 with pneumonia.  Home dosage of warfarin reported as 3mg  daily except 2mg  on Saturdays.  Today, 11/22/2015   INR subtherapeutic (1.82) - dose was held 5/26 as INR > 3.5, resumed 3mg  5/27  No bleeding documented, no CBC today  Thin liquid diet ordered  Drug interactions: received one dose of levaquin 5/26, now on rocephin/zithromax which may result in increased warfarin sensitivity  Goal of Therapy:  INR 2.5-3.5  Plan:   Warfarin 5mg  PO x 1 today - give early at 10AM  Daily PT/INR  Suggest Lovenox bridge until INR therapeutic  Peggyann Juba, PharmD, BCPS Pager: [phone] 11/22/2015,8:21 AM  ___________________  Adden:  To start lovenox for sub-therapeutic INR - Lovenox 60 mg SQ q12h - cbc q72 hrs  Dia Sitter, PharmD, BCPS 11/22/2015 8:52 AM

## 2015-11-22 NOTE — Progress Notes (Signed)
PT Cancellation Note  Patient Details Name: Courtney Evans MRN: [id number] DOB: 1935-12-23   Cancelled Treatment:    Reason Eval/Treat Not Completed: PT screened, no needs identified, will sign off; spoke with RN who states per report pt is mobilizing without difficulty and is to D/C today   [facility] 11/22/2015, 10:43 AM

## 2015-11-22 NOTE — Discharge Summary (Signed)
Physician Discharge Summary  Courtney Evans [id number] DOB: February 01, 1936 DOA: 11/19/2015  PCP: No primary care provider on file.  Admit date: 11/19/2015 Discharge date: 11/22/2015  Time spent: 35 minutes  Recommendations for Outpatient Follow-up:  1. Follow up with coumadin clinic this week.    Discharge Diagnoses:  Principal Problem:   Sepsis due to pneumonia [facility] Active Problems:   CAP (community acquired pneumonia)   Coagulopathy ([place])   Chronic atrial fibrillation (HCC)   Lactic acidosis   Mechanical heart valve present   Discharge Condition: stable  Diet recommendation: regular  Filed Weights   11/20/15 0044  Weight: 60.601 kg (133 lb 9.6 oz)    History of present illness:  80 year old female with past medical history of aortic and mitral valve replacement, atrial fibrillation on Coumadin that is referred to the ED for right basilar pneumonia and hypoxia.  Hospital Course:  Sepsis due to community-acquired pneumonia: Her blood pressure was borderline in the emergency room with elevated lactic acid she was fluid resuscitated and she responded she was also started on empiric antibiotics on admission, when she became afebrile she was D escalated to Levaquin orally which she will continue as an outpatient.   History of mitral valve and aortic valve replacement/chronic atrial fibrillation: INR is 2.5 3.5, her INR became subtherapeutic in the hospital she was discharged on Lovenox and Coumadin, she will follow-up with the Coumadin clinic on 5.30 for INR checkup.  Procedures:  CXR  Consultations:  none  Discharge Exam: Filed Vitals:   11/21/15 2112 11/22/15 0508  BP: 156/75 156/68  Pulse: 78 72  Temp: 98 F (36.7 C) 98.3 F (36.8 C)  Resp: 20 22    General: A&O x3 Cardiovascular: RRR Respiratory: good air movement CTA B/L  Discharge Instructions   Discharge Instructions    Diet - low sodium heart healthy    Complete by:  As directed      Increase  activity slowly    Complete by:  As directed           Current Discharge Medication List    START taking these medications   Details  enoxaparin (LOVENOX) 60 MG/0.6ML injection Inject 0.9 mLs (90 mg total) into the skin daily. Qty: 3 Syringe, Refills: 0    levofloxacin (LEVAQUIN) 500 MG tablet Take 1 tablet (500 mg total) by mouth daily. Qty: 2 tablet, Refills: 0      CONTINUE these medications which have NOT CHANGED   Details  amitriptyline (ELAVIL) 10 MG tablet Take 10 mg by mouth at bedtime as needed for sleep. Reported on 11/19/2015    amLODipine (NORVASC) 5 MG tablet Take 5 mg by mouth daily.    metFORMIN (GLUCOPHAGE) 500 MG tablet Take 500 mg by mouth 2 (two) times daily with a meal.    PARoxetine (PAXIL) 40 MG tablet Take 40 mg by mouth daily.     pravastatin (PRAVACHOL) 80 MG tablet Take 80 mg by mouth daily. Reported on 11/19/2015    !! warfarin (COUMADIN) 2 MG tablet Take 2 mg by mouth once a week. On Saturday.    !! warfarin (COUMADIN) 3 MG tablet Take 3 mg by mouth as directed. Take 1 tablet (3 mg) Everyday Except on Saturdays     !! - Potential duplicate medications found. Please discuss with provider.     Allergies  Allergen Reactions  . Codeine Nausea Only      The results of significant diagnostics from this hospitalization (including imaging, microbiology, ancillary and laboratory)  are listed below for reference.    Significant Diagnostic Studies: Dg Chest 2 View  11/20/2015  CLINICAL DATA:  Cough, shortness of Breath EXAM: CHEST  2 VIEW COMPARISON:  None FINDINGS: Borderline cardiomegaly. Status post median sternotomy. Single lead cardiac pacemaker in place. Diffuse osteopenia is noted. Mild compression deformities mid and lower thoracic spine of indeterminate age. Clinical correlation is necessary. Prior vertebroplasty lumbar spine. No pulmonary edema. There is tiny right pleural effusion with right base atelectasis or infiltrate. IMPRESSION: Borderline  cardiomegaly. Status post median sternotomy. Tiny right pleural effusion with right basilar atelectasis or infiltrate. No pulmonary edema. Thoracic spine osteopenia. Multiple thoracic spine compression deformities of indeterminate age. Clinical correlation is necessary. Electronically Signed   By: Lahoma Crocker M.D.   On: 11/20/2015 09:50    Microbiology: Recent Results (from the past 240 hour(s))  Culture, blood (routine x 2) Call MD if unable to obtain prior to antibiotics being given     Status: None (Preliminary result)   Collection Time: 11/20/15  1:11 AM  Result Value Ref Range Status   Specimen Description BLOOD RIGHT ANTECUBITAL  Final   Special Requests BOTTLES DRAWN AEROBIC AND ANAEROBIC 5ML  Final   Culture   Final    NO GROWTH 1 DAY Performed at [facility]    Report Status PENDING  Incomplete  Culture, blood (routine x 2) Call MD if unable to obtain prior to antibiotics being given     Status: None (Preliminary result)   Collection Time: 11/20/15  1:11 AM  Result Value Ref Range Status   Specimen Description BLOOD LEFT HAND  Final   Special Requests IN PEDIATRIC BOTTLE 2ML  Final   Culture   Final    NO GROWTH 1 DAY Performed at [facility]    Report Status PENDING  Incomplete  Culture, sputum-assessment     Status: None   Collection Time: 11/21/15  2:30 PM  Result Value Ref Range Status   Specimen Description SPUTUM  Final   Special Requests NONE  Final   Sputum evaluation   Final    THIS SPECIMEN IS ACCEPTABLE. RESPIRATORY CULTURE REPORT TO FOLLOW.   Report Status 11/21/2015 FINAL  Final  Culture, respiratory (NON-Expectorated)     Status: None (Preliminary result)   Collection Time: 11/21/15  2:30 PM  Result Value Ref Range Status   Specimen Description SPUTUM  Final   Special Requests NONE  Final   Gram Stain   Final    RARE WBC PRESENT, PREDOMINANTLY MONONUCLEAR FEW SQUAMOUS EPITHELIAL CELLS PRESENT MODERATE GRAM POSITIVE COCCI IN  CLUSTERS Performed at [facility]    Culture PENDING  Incomplete   Report Status PENDING  Incomplete     Labs: Basic Metabolic Panel:  Recent Labs Lab 11/19/15 2200 11/20/15 0111  NA 137 134*  K 4.0 4.1  CL 97* 100*  CO2  --  25  GLUCOSE 196* 180*  BUN 13 13  CREATININE 0.90 0.85  CALCIUM  --  9.2   Liver Function Tests:  Recent Labs Lab 11/20/15 0111  AST 35  ALT 17  ALKPHOS 84  BILITOT 1.2  PROT 7.5  ALBUMIN 4.5   No results for input(s): LIPASE, AMYLASE in the last 168 hours. No results for input(s): AMMONIA in the last 168 hours. CBC:  Recent Labs Lab 11/19/15 2200 11/20/15 0111  WBC  --  6.5  HGB 12.9 12.6  HCT 38.0 37.9  MCV  --  93.6  PLT  --  181   Cardiac Enzymes: No results for input(s): CKTOTAL, CKMB, CKMBINDEX, TROPONINI in the last 168 hours. BNP: BNP (last 3 results) No results for input(s): BNP in the last 8760 hours.  ProBNP (last 3 results) No results for input(s): PROBNP in the last 8760 hours.  CBG:  Recent Labs Lab 11/21/15 0746 11/21/15 1151 11/21/15 1654 11/21/15 2107 11/22/15 0804  GLUCAP 139* 147* 94 118* 91       Signed:  [name] MD.  [facility] 11/22/2015, 9:18 AM

## 2015-11-24 LAB — CULTURE, RESPIRATORY

## 2015-11-24 LAB — CULTURE, RESPIRATORY W GRAM STAIN: Culture: NORMAL

## 2015-11-25 LAB — CULTURE, BLOOD (ROUTINE X 2)
CULTURE: NO GROWTH
CULTURE: NO GROWTH

## 2015-11-26 DIAGNOSIS — I4891 Unspecified atrial fibrillation: Secondary | ICD-10-CM | POA: Diagnosis not present

## 2015-12-06 DIAGNOSIS — J309 Allergic rhinitis, unspecified: Secondary | ICD-10-CM | POA: Diagnosis not present

## 2015-12-06 DIAGNOSIS — R05 Cough: Secondary | ICD-10-CM | POA: Diagnosis not present

## 2015-12-18 DIAGNOSIS — Z7984 Long term (current) use of oral hypoglycemic drugs: Secondary | ICD-10-CM | POA: Diagnosis not present

## 2015-12-18 DIAGNOSIS — J189 Pneumonia, unspecified organism: Secondary | ICD-10-CM | POA: Diagnosis not present

## 2015-12-18 DIAGNOSIS — E119 Type 2 diabetes mellitus without complications: Secondary | ICD-10-CM | POA: Diagnosis not present

## 2015-12-18 DIAGNOSIS — E559 Vitamin D deficiency, unspecified: Secondary | ICD-10-CM | POA: Diagnosis not present

## 2015-12-18 DIAGNOSIS — E782 Mixed hyperlipidemia: Secondary | ICD-10-CM | POA: Diagnosis not present

## 2015-12-18 DIAGNOSIS — Z7901 Long term (current) use of anticoagulants: Secondary | ICD-10-CM | POA: Diagnosis not present

## 2015-12-18 DIAGNOSIS — F3342 Major depressive disorder, recurrent, in full remission: Secondary | ICD-10-CM | POA: Diagnosis not present

## 2015-12-18 DIAGNOSIS — Z952 Presence of prosthetic heart valve: Secondary | ICD-10-CM | POA: Diagnosis not present

## 2015-12-18 DIAGNOSIS — G479 Sleep disorder, unspecified: Secondary | ICD-10-CM | POA: Diagnosis not present

## 2015-12-18 DIAGNOSIS — I480 Paroxysmal atrial fibrillation: Secondary | ICD-10-CM | POA: Diagnosis not present

## 2015-12-18 DIAGNOSIS — M810 Age-related osteoporosis without current pathological fracture: Secondary | ICD-10-CM | POA: Diagnosis not present

## 2015-12-18 DIAGNOSIS — I10 Essential (primary) hypertension: Secondary | ICD-10-CM | POA: Diagnosis not present

## 2016-01-01 ENCOUNTER — Ambulatory Visit: Payer: Medicare Other | Admitting: Cardiovascular Disease

## 2016-01-15 DIAGNOSIS — Z7901 Long term (current) use of anticoagulants: Secondary | ICD-10-CM | POA: Diagnosis not present

## 2016-01-15 DIAGNOSIS — I480 Paroxysmal atrial fibrillation: Secondary | ICD-10-CM | POA: Diagnosis not present

## 2016-01-16 DIAGNOSIS — E119 Type 2 diabetes mellitus without complications: Secondary | ICD-10-CM | POA: Diagnosis not present

## 2016-01-16 DIAGNOSIS — H40033 Anatomical narrow angle, bilateral: Secondary | ICD-10-CM | POA: Diagnosis not present

## 2016-01-18 DIAGNOSIS — Z7901 Long term (current) use of anticoagulants: Secondary | ICD-10-CM | POA: Diagnosis not present

## 2016-01-18 DIAGNOSIS — I480 Paroxysmal atrial fibrillation: Secondary | ICD-10-CM | POA: Diagnosis not present

## 2016-01-30 DIAGNOSIS — H04123 Dry eye syndrome of bilateral lacrimal glands: Secondary | ICD-10-CM | POA: Diagnosis not present

## 2016-02-01 DIAGNOSIS — Z7901 Long term (current) use of anticoagulants: Secondary | ICD-10-CM | POA: Diagnosis not present

## 2016-02-01 DIAGNOSIS — I480 Paroxysmal atrial fibrillation: Secondary | ICD-10-CM | POA: Diagnosis not present

## 2016-02-19 ENCOUNTER — Encounter: Payer: Self-pay | Admitting: Cardiovascular Disease

## 2016-02-19 ENCOUNTER — Ambulatory Visit (INDEPENDENT_AMBULATORY_CARE_PROVIDER_SITE_OTHER): Payer: Medicare Other | Admitting: Cardiovascular Disease

## 2016-02-19 VITALS — BP 137/72 | HR 80 | Ht 64.0 in | Wt 131.6 lb

## 2016-02-19 DIAGNOSIS — Z950 Presence of cardiac pacemaker: Secondary | ICD-10-CM

## 2016-02-19 DIAGNOSIS — I482 Chronic atrial fibrillation, unspecified: Secondary | ICD-10-CM

## 2016-02-19 DIAGNOSIS — Z954 Presence of other heart-valve replacement: Secondary | ICD-10-CM | POA: Diagnosis not present

## 2016-02-19 DIAGNOSIS — I5032 Chronic diastolic (congestive) heart failure: Secondary | ICD-10-CM

## 2016-02-19 DIAGNOSIS — I10 Essential (primary) hypertension: Secondary | ICD-10-CM

## 2016-02-19 DIAGNOSIS — I099 Rheumatic heart disease, unspecified: Secondary | ICD-10-CM

## 2016-02-19 DIAGNOSIS — I447 Left bundle-branch block, unspecified: Secondary | ICD-10-CM

## 2016-02-19 DIAGNOSIS — I272 Other secondary pulmonary hypertension: Secondary | ICD-10-CM

## 2016-02-19 DIAGNOSIS — Z7901 Long term (current) use of anticoagulants: Secondary | ICD-10-CM

## 2016-02-19 DIAGNOSIS — I2721 Secondary pulmonary arterial hypertension: Secondary | ICD-10-CM

## 2016-02-19 DIAGNOSIS — Z952 Presence of prosthetic heart valve: Secondary | ICD-10-CM

## 2016-02-19 DIAGNOSIS — E785 Hyperlipidemia, unspecified: Secondary | ICD-10-CM

## 2016-02-19 DIAGNOSIS — I480 Paroxysmal atrial fibrillation: Secondary | ICD-10-CM | POA: Diagnosis not present

## 2016-02-19 NOTE — Progress Notes (Signed)
Cardiology Office Note    Date:  02/19/2016   ID:  Courtney Evans, DOB [birth year], MRN [id number]  PCP:  Cari Caraway, MD  Cardiologist:   Sanda Klein, MD  Consultation requested by PCP for: Atrial fibrillation, valvular heart disease, pacemaker Chief Complaint  Patient presents with  . New Evaluation    History of Present Illness:  Courtney Evans is a 80 y.o. female who has relocated to [facility] from the [place] area. She is accompanied by her daughter Lattie Haw, a Copywriter, advertising.  She has a long-standing history of heart problems. She had rheumatic heart disease and was initially diagnosed in 1962 when she underwent some type of surgical procedure (presumably a closeD commissurotomy during that historical period). In 1994 she underwent aortic and mitral valve replacement at [facility] receiving 2 mechanical valves. Most recent echo in December 2016 showed normal function of both prosthetic valves In 1998 received a single-chamber permanent pacemaker generator change out in 2013  [facility] [place]).  She bears a diagnosis of diastolic heart failure, but does not require routine loop diuretic therapy. She had an episode of acute exacerbation of heart failure in December 2016 at the time of a vertebral fracture. Previous echocardiograms have documented moderate pulmonary artery hypertension in the 50-55 mmHg range. She has not had stroke or other embolic events and denies any complications with warfarin anticoagulation. Anticoagulation is currently monitored by her primary care provider, Dr. Vernon Prey. She has had problems with osteoporosis vertebral compression fractions and has had multiple kyphoplasty's. She has hyperlipidemia and takes a statin, but as far as I know has never had coronary problems. She has mild hypertension, treated with amlodipine.  She was hospitalized at [facility] in May with pneumonia.  She is feeling well. She denies problems with leg edema. She  has chronic shortness of breath, NYHA functional class II. She can take care of personal hygiene tasks without dyspnea, but becomes short of breath she has to sweep or use the vacuum cleaner. She has not had palpitations or syncope. She denies any bleeding problems or neurological complaints. She does not have chest pain.  Pacemaker interrogation shows normal device function. Current estimated generator longevity is 4.5 years. There is 68.5% ventricular sensed and 31.5% ventricular paced rhythm, with the device programmed VVIR lower rate limit of 60 bpm. There is no evidence of problems with rapid ventricular rates, but 2 episodes of nonsustained VT were recorded last year (3 seconds on September 30, 5 seconds December 12) lead parameters are excellent and auto capture is on.  Presenting rhythm today was atrial fibrillation with 100% ventricular pacing at 80 bpm. Previous ECGs that are available from December show that the underlying QRS is broad with a pattern of left bundle branch block and left axis deviation. They the QTc is 551 ms.    Past Medical History:  Diagnosis Date  . A-fib ([place])   . Compression fracture   . Coronary artery disease   . Diabetes mellitus without complication ([place])   . Hyperlipidemia   . Hypertension   . Pacemaker     Past Surgical History:  Procedure Laterality Date  . AORTIC AND MITRAL VALVE REPLACEMENT  1994   w/ mechanical valves at [facility]  . CARDIAC SURGERY    . KYPHOPLASTY  2011   1 - at [facility]  . KYPHOPLASTY  2012   2 - at [facility]  . KYPHOPLASTY  2013   1 - at [facility]  .  KYPHOPLASTY  05/2015   1 - at [place]  . PACEMAKER GENERATOR CHANGE  2013  . [place]    Current Medications: Outpatient Medications Prior to Visit  Medication Sig Dispense Refill  . amitriptyline (ELAVIL) 10 MG  tablet Take 10 mg by mouth at bedtime as needed for sleep. Reported on 11/19/2015    . amLODipine (NORVASC) 5 MG tablet Take 5 mg by mouth daily.    . metFORMIN (GLUCOPHAGE) 500 MG tablet Take 500 mg by mouth 2 (two) times daily with a meal.    . PARoxetine (PAXIL) 40 MG tablet Take 40 mg by mouth daily.     Marland Kitchen warfarin (COUMADIN) 2 MG tablet Take 2 mg by mouth once a week. On Saturday.    . warfarin (COUMADIN) 3 MG tablet Take 3 mg by mouth as directed. Take 1 tablet (3 mg) Everyday Except on Saturdays    . enoxaparin (LOVENOX) 60 MG/0.6ML injection Inject 0.9 mLs (90 mg total) into the skin daily. (Patient not taking: Reported on 02/19/2016) 3 Syringe 0  . levofloxacin (LEVAQUIN) 500 MG tablet Take 1 tablet (500 mg total) by mouth daily. (Patient not taking: Reported on 02/19/2016) 2 tablet 0  . pravastatin (PRAVACHOL) 80 MG tablet Take 80 mg by mouth daily. Reported on 11/19/2015     No facility-administered medications prior to visit.      Allergies:   Codeine   Social History   Social History  . Marital status: Married    Spouse name: N/A  . Number of children: N/A  . Years of education: N/A   Social History Main Topics  . Smoking status: Never Smoker  . Smokeless tobacco: None  . Alcohol use No  . Drug use: No  . Sexual activity: Not Asked   Other Topics Concern  . None   Social History Narrative   Epworth Sleepiness Scale = 3 (as of 02/19/2016)     Family History:  The patient's family history includes Heart attack in her mother; Stroke in her father.   ROS:   Please see the history of present illness.    ROS All other systems reviewed and are negative.   PHYSICAL EXAM:   VS:  BP 137/72   Pulse 80   Ht 5\' 4"  (1.626 m)   Wt 131 lb 9.6 oz (59.7 kg)   SpO2 97%   BMI 22.59 kg/m    GEN: Well nourished, well developed, in no acute distress  HEENT: normal  Neck: no JVD, carotid bruits, or masses Cardiac: Crisp mechanical valve clicks , RRR; no murmurs, rubs, or  gallops,no edema  Respiratory:  clear to auscultation bilaterally, normal work of breathing GI: soft, nontender, nondistended, + BS MS: no deformity or atrophy  Skin: warm and dry, no rash Neuro:  Alert and Oriented x 3, Strength and sensation are intact Psych: euthymic mood, full affect  Wt Readings from Last 3 Encounters:  02/19/16 131 lb 9.6 oz (59.7 kg)  11/20/15 133 lb 9.6 oz (60.6 kg)      Studies/Labs Reviewed:   EKG:  EKG is ordered today.  The ekg ordered today demonstrates background atrial fibrillation/possible left atrial flutter, 100% ventricular paced rhythm, QTC 551 ms  Recent Labs: 11/20/2015: ALT 17; BUN 13; Creatinine, Ser 0.85; Hemoglobin 12.6; Platelets 181; Potassium 4.1; Sodium 134   Lipid Panel No results found  for: CHOL, TRIG, HDL, CHOLHDL, VLDL, LDLCALC, LDLDIRECT  Additional studies/ records that were reviewed today include:  Hospital records from May 2017, records from [place]:    1. Chronic atrial fibrillation ([place])   2. Chronic diastolic heart failure ([place])   3. Hx of mechanical aortic valve replacement   4. Hx of mitral valve replacement with mechanical valve   5. PAH (pulmonary artery hypertension) ([place])   6. Rheumatic heart disease   7. Pacemaker   8. Long term current use of anticoagulant   9. Hyperlipidemia   10. LBBB (left bundle branch block)   11. Essential hypertension      PLAN:  In order of problems listed above:  1. AFib: Rate control is adequate without any medications. On appropriate anticoagulation with warfarin without bleeding or embolic complications. 2. CHF: She appears clinically euvolemic today, NYHA functional class II. Chronic dyspnea may be related to some degree of permanent fixed pulmonary artery hypertension, rather than heart failure 3. AVR: Reportedly with normal function by echo December 2016, recheck echo at a one-year interval 4. MVR: as above 5. PAH: Suspect that she may have fixed  pulmonary artery hypertension from long-standing rheumatic mitral valve disease (? Mitral stenosis). Recheck by echo. 6. Aware of need for endocarditis prophylaxis. No need for streptococcal prophylaxis. 7. PPM: Normally functioning single-chamber device, she is not pacemaker dependent. Will arrange enrollment in our device clinic for download every 3 months. Introduced a sleep rate of 50 beats per minutes to limit pacing at lower rate. 8. Warfarin: Monitored by Dr. Leonides Schanz 9. HLP: On statin, get labs from PCP 10. LBBB: Data not available, but pacemaker may have been implanted for intermittent high-grade AV block 11. HTN: The pressure little high when she first checked in, normal by the time our interview was complete. Continue amlodipine.    Medication Adjustments/Labs and Tests Ordered: Current medicines are reviewed at length with the patient today.  Concerns regarding medicines are outlined above.  Medication changes, Labs and Tests ordered today are listed in the Patient Instructions below. Patient Instructions  Dr Sallyanne Kuster recommends that you continue on your current medications as directed. Please refer to the Current Medication list given to you today.  Remote monitoring is used to monitor your Pacemaker of ICD from home. This monitoring reduces the number of office visits required to check your device to one time per year. It allows [place] to keep an eye on the functioning of your device to ensure it is working properly. You are scheduled for a device check from home on Monday, November 27th, 2017. You may send your transmission at any time that day. If you have a wireless device, the transmission will be sent automatically. After your physician reviews your transmission, you will receive a postcard with your next transmission date.  Dr Sallyanne Kuster recommends that you schedule a follow-up appointment in 6 months with a pacemaker check. You will receive a reminder letter in the mail two months in  advance. If you don't receive a letter, please call our office to schedule the follow-up appointment.  If you need a refill on your cardiac medications before your next appointment, please call your pharmacy.    Signed, Sanda Klein, MD  02/19/2016 9:43 AM    [facility] [address], [place], [place]  [address] Phone: [phone]; Fax: [phone]

## 2016-02-19 NOTE — Patient Instructions (Signed)
Dr Sallyanne Kuster recommends that you continue on your current medications as directed. Please refer to the Current Medication list given to you today.  Remote monitoring is used to monitor your Pacemaker of ICD from home. This monitoring reduces the number of office visits required to check your device to one time per year. It allows [place] to keep an eye on the functioning of your device to ensure it is working properly. You are scheduled for a device check from home on Monday, November 27th, 2017. You may send your transmission at any time that day. If you have a wireless device, the transmission will be sent automatically. After your physician reviews your transmission, you will receive a postcard with your next transmission date.  Dr Sallyanne Kuster recommends that you schedule a follow-up appointment in 6 months with a pacemaker check. You will receive a reminder letter in the mail two months in advance. If you don't receive a letter, please call our office to schedule the follow-up appointment.  If you need a refill on your cardiac medications before your next appointment, please call your pharmacy.

## 2016-02-23 LAB — CUP PACEART INCLINIC DEVICE CHECK
Battery Remaining Longevity: 54 mo
Battery Voltage: 2.77 V
Brady Statistic RV Percent Paced: 31.5 %
Date Time Interrogation Session: 20170829120652
Implantable Lead Implant Date: 19980924
Implantable Lead Model: 5068
Lead Channel Pacing Threshold Amplitude: 0.875 V
Lead Channel Pacing Threshold Pulse Width: 0.4 ms
MDC IDC LEAD LOCATION: 753860
MDC IDC MSMT LEADCHNL RV IMPEDANCE VALUE: 1310 Ohm
MDC IDC MSMT LEADCHNL RV SENSING INTR AMPL: 16 mV
MDC IDC SET LEADCHNL RV PACING AMPLITUDE: 2 V
MDC IDC SET LEADCHNL RV PACING PULSEWIDTH: 0.4 ms

## 2016-02-26 ENCOUNTER — Encounter: Payer: Self-pay | Admitting: Cardiovascular Disease

## 2016-03-04 DIAGNOSIS — I480 Paroxysmal atrial fibrillation: Secondary | ICD-10-CM | POA: Diagnosis not present

## 2016-03-04 DIAGNOSIS — Z7901 Long term (current) use of anticoagulants: Secondary | ICD-10-CM | POA: Diagnosis not present

## 2016-03-09 ENCOUNTER — Telehealth: Payer: Self-pay | Admitting: *Deleted

## 2016-03-09 NOTE — Telephone Encounter (Signed)
LMOVM requesting call back regarding home monitor.  Gave device clinic phone number.  Will order Carelink monitor and [place] for patient when she returns call and confirms mailing address.

## 2016-03-18 DIAGNOSIS — N814 Uterovaginal prolapse, unspecified: Secondary | ICD-10-CM | POA: Diagnosis not present

## 2016-03-18 DIAGNOSIS — I480 Paroxysmal atrial fibrillation: Secondary | ICD-10-CM | POA: Diagnosis not present

## 2016-03-18 DIAGNOSIS — Z960 Presence of urogenital implants: Secondary | ICD-10-CM | POA: Diagnosis not present

## 2016-03-18 DIAGNOSIS — Z7901 Long term (current) use of anticoagulants: Secondary | ICD-10-CM | POA: Diagnosis not present

## 2016-03-18 DIAGNOSIS — N8110 Cystocele, unspecified: Secondary | ICD-10-CM | POA: Diagnosis not present

## 2016-03-25 NOTE — Telephone Encounter (Signed)
Spoke with patient.  Confirmed mailing address that patient would like new Carelink monitor ordered to.  2490 monitor and WireX ordered.  Will mail instructions on Monday.  Patient is aware that she can call our office for assistance setting up her home monitor when she receives both pieces in the mail.  She is appreciative of call and denies additional questions or concerns at this time.

## 2016-03-28 NOTE — Telephone Encounter (Signed)
Instructions mailed to patients home address.

## 2016-03-30 DIAGNOSIS — B373 Candidiasis of vulva and vagina: Secondary | ICD-10-CM | POA: Diagnosis not present

## 2016-04-02 DIAGNOSIS — R300 Dysuria: Secondary | ICD-10-CM | POA: Diagnosis not present

## 2016-04-15 DIAGNOSIS — Z7901 Long term (current) use of anticoagulants: Secondary | ICD-10-CM | POA: Diagnosis not present

## 2016-04-15 DIAGNOSIS — I480 Paroxysmal atrial fibrillation: Secondary | ICD-10-CM | POA: Diagnosis not present

## 2016-05-13 DIAGNOSIS — Z23 Encounter for immunization: Secondary | ICD-10-CM | POA: Diagnosis not present

## 2016-05-13 DIAGNOSIS — I480 Paroxysmal atrial fibrillation: Secondary | ICD-10-CM | POA: Diagnosis not present

## 2016-05-13 DIAGNOSIS — Z7901 Long term (current) use of anticoagulants: Secondary | ICD-10-CM | POA: Diagnosis not present

## 2016-05-23 ENCOUNTER — Telehealth: Payer: Self-pay | Admitting: Cardiology

## 2016-05-23 ENCOUNTER — Encounter: Payer: Medicare Other | Admitting: *Deleted

## 2016-05-23 NOTE — Telephone Encounter (Signed)
LMOVM reminding pt to send remote transmission.   

## 2016-05-26 DIAGNOSIS — E782 Mixed hyperlipidemia: Secondary | ICD-10-CM | POA: Diagnosis not present

## 2016-05-26 DIAGNOSIS — Z7901 Long term (current) use of anticoagulants: Secondary | ICD-10-CM | POA: Diagnosis not present

## 2016-05-26 DIAGNOSIS — E559 Vitamin D deficiency, unspecified: Secondary | ICD-10-CM | POA: Diagnosis not present

## 2016-05-26 DIAGNOSIS — R079 Chest pain, unspecified: Secondary | ICD-10-CM | POA: Diagnosis not present

## 2016-05-26 DIAGNOSIS — I480 Paroxysmal atrial fibrillation: Secondary | ICD-10-CM | POA: Diagnosis not present

## 2016-05-27 ENCOUNTER — Encounter: Payer: Self-pay | Admitting: Cardiology

## 2016-05-30 ENCOUNTER — Ambulatory Visit (INDEPENDENT_AMBULATORY_CARE_PROVIDER_SITE_OTHER): Payer: Medicare Other | Admitting: *Deleted

## 2016-05-30 DIAGNOSIS — I482 Chronic atrial fibrillation, unspecified: Secondary | ICD-10-CM

## 2016-05-30 LAB — CUP PACEART REMOTE DEVICE CHECK
Brady Statistic RV Percent Paced: 31 %
Lead Channel Impedance Value: 1208 Ohm
Lead Channel Pacing Threshold Amplitude: 1 V
Lead Channel Setting Pacing Amplitude: 2 V
Lead Channel Setting Pacing Pulse Width: 0.4 ms
MDC IDC LEAD IMPLANT DT: 19980924
MDC IDC LEAD LOCATION: 753860
MDC IDC MSMT BATTERY IMPEDANCE: 1472 Ohm
MDC IDC MSMT BATTERY REMAINING LONGEVITY: 50 mo
MDC IDC MSMT BATTERY VOLTAGE: 2.77 V
MDC IDC MSMT LEADCHNL RA IMPEDANCE VALUE: 0 Ohm
MDC IDC MSMT LEADCHNL RV PACING THRESHOLD PULSEWIDTH: 0.4 ms
MDC IDC PG IMPLANT DT: 20130429
MDC IDC SESS DTM: 20171202233418
MDC IDC SET LEADCHNL RV SENSING SENSITIVITY: 5.6 mV

## 2016-05-31 NOTE — Progress Notes (Signed)
Remote pacemaker transmission.   

## 2016-06-03 DIAGNOSIS — H353131 Nonexudative age-related macular degeneration, bilateral, early dry stage: Secondary | ICD-10-CM | POA: Diagnosis not present

## 2016-06-08 ENCOUNTER — Encounter: Payer: Self-pay | Admitting: Cardiology

## 2016-06-10 DIAGNOSIS — E559 Vitamin D deficiency, unspecified: Secondary | ICD-10-CM | POA: Diagnosis not present

## 2016-06-10 DIAGNOSIS — I480 Paroxysmal atrial fibrillation: Secondary | ICD-10-CM | POA: Diagnosis not present

## 2016-06-10 DIAGNOSIS — Z7984 Long term (current) use of oral hypoglycemic drugs: Secondary | ICD-10-CM | POA: Diagnosis not present

## 2016-06-10 DIAGNOSIS — I10 Essential (primary) hypertension: Secondary | ICD-10-CM | POA: Diagnosis not present

## 2016-06-10 DIAGNOSIS — Z7901 Long term (current) use of anticoagulants: Secondary | ICD-10-CM | POA: Diagnosis not present

## 2016-06-10 DIAGNOSIS — F3342 Major depressive disorder, recurrent, in full remission: Secondary | ICD-10-CM | POA: Diagnosis not present

## 2016-06-10 DIAGNOSIS — E119 Type 2 diabetes mellitus without complications: Secondary | ICD-10-CM | POA: Diagnosis not present

## 2016-06-10 DIAGNOSIS — E782 Mixed hyperlipidemia: Secondary | ICD-10-CM | POA: Diagnosis not present

## 2016-06-10 DIAGNOSIS — Z952 Presence of prosthetic heart valve: Secondary | ICD-10-CM | POA: Diagnosis not present

## 2016-06-10 DIAGNOSIS — M810 Age-related osteoporosis without current pathological fracture: Secondary | ICD-10-CM | POA: Diagnosis not present

## 2016-06-10 DIAGNOSIS — G479 Sleep disorder, unspecified: Secondary | ICD-10-CM | POA: Diagnosis not present

## 2016-07-08 DIAGNOSIS — L821 Other seborrheic keratosis: Secondary | ICD-10-CM | POA: Diagnosis not present

## 2016-07-08 DIAGNOSIS — Z7901 Long term (current) use of anticoagulants: Secondary | ICD-10-CM | POA: Diagnosis not present

## 2016-07-08 DIAGNOSIS — I480 Paroxysmal atrial fibrillation: Secondary | ICD-10-CM | POA: Diagnosis not present

## 2016-07-08 DIAGNOSIS — L570 Actinic keratosis: Secondary | ICD-10-CM | POA: Diagnosis not present

## 2016-07-08 DIAGNOSIS — D0439 Carcinoma in situ of skin of other parts of face: Secondary | ICD-10-CM | POA: Diagnosis not present

## 2016-07-08 DIAGNOSIS — D040 Carcinoma in situ of skin of lip: Secondary | ICD-10-CM | POA: Diagnosis not present

## 2016-07-08 DIAGNOSIS — D485 Neoplasm of uncertain behavior of skin: Secondary | ICD-10-CM | POA: Diagnosis not present

## 2016-07-15 DIAGNOSIS — N814 Uterovaginal prolapse, unspecified: Secondary | ICD-10-CM | POA: Diagnosis not present

## 2016-07-15 DIAGNOSIS — Z960 Presence of urogenital implants: Secondary | ICD-10-CM | POA: Diagnosis not present

## 2016-07-15 DIAGNOSIS — N8110 Cystocele, unspecified: Secondary | ICD-10-CM | POA: Diagnosis not present

## 2016-07-15 DIAGNOSIS — Z01419 Encounter for gynecological examination (general) (routine) without abnormal findings: Secondary | ICD-10-CM | POA: Diagnosis not present

## 2016-07-15 DIAGNOSIS — Z124 Encounter for screening for malignant neoplasm of cervix: Secondary | ICD-10-CM | POA: Diagnosis not present

## 2016-07-15 DIAGNOSIS — Z6822 Body mass index (BMI) 22.0-22.9, adult: Secondary | ICD-10-CM | POA: Diagnosis not present

## 2016-07-15 DIAGNOSIS — Z1231 Encounter for screening mammogram for malignant neoplasm of breast: Secondary | ICD-10-CM | POA: Diagnosis not present

## 2016-07-18 DIAGNOSIS — Z124 Encounter for screening for malignant neoplasm of cervix: Secondary | ICD-10-CM | POA: Diagnosis not present

## 2016-07-27 DIAGNOSIS — N814 Uterovaginal prolapse, unspecified: Secondary | ICD-10-CM | POA: Diagnosis not present

## 2016-07-27 DIAGNOSIS — N8110 Cystocele, unspecified: Secondary | ICD-10-CM | POA: Diagnosis not present

## 2016-07-27 DIAGNOSIS — Z960 Presence of urogenital implants: Secondary | ICD-10-CM | POA: Diagnosis not present

## 2016-07-28 ENCOUNTER — Other Ambulatory Visit: Payer: Self-pay | Admitting: Obstetrics and Gynecology

## 2016-07-28 DIAGNOSIS — R928 Other abnormal and inconclusive findings on diagnostic imaging of breast: Secondary | ICD-10-CM

## 2016-08-04 DIAGNOSIS — I480 Paroxysmal atrial fibrillation: Secondary | ICD-10-CM | POA: Diagnosis not present

## 2016-08-04 DIAGNOSIS — C4402 Squamous cell carcinoma of skin of lip: Secondary | ICD-10-CM | POA: Diagnosis not present

## 2016-08-04 DIAGNOSIS — Z85828 Personal history of other malignant neoplasm of skin: Secondary | ICD-10-CM | POA: Diagnosis not present

## 2016-08-04 DIAGNOSIS — Z7901 Long term (current) use of anticoagulants: Secondary | ICD-10-CM | POA: Diagnosis not present

## 2016-08-05 ENCOUNTER — Other Ambulatory Visit: Payer: Self-pay | Admitting: Obstetrics and Gynecology

## 2016-08-05 ENCOUNTER — Ambulatory Visit
Admission: RE | Admit: 2016-08-05 | Discharge: 2016-08-05 | Disposition: A | Discharge status: Home / Self Care | Payer: Medicare Other | Source: Ambulatory Visit | Attending: Obstetrics and Gynecology | Admitting: Obstetrics and Gynecology

## 2016-08-05 DIAGNOSIS — R928 Other abnormal and inconclusive findings on diagnostic imaging of breast: Secondary | ICD-10-CM

## 2016-08-05 DIAGNOSIS — N6320 Unspecified lump in the left breast, unspecified quadrant: Secondary | ICD-10-CM

## 2016-08-05 DIAGNOSIS — N6322 Unspecified lump in the left breast, upper inner quadrant: Secondary | ICD-10-CM | POA: Diagnosis not present

## 2016-08-18 ENCOUNTER — Other Ambulatory Visit: Payer: Self-pay | Admitting: Obstetrics and Gynecology

## 2016-08-18 DIAGNOSIS — R928 Other abnormal and inconclusive findings on diagnostic imaging of breast: Secondary | ICD-10-CM

## 2016-08-18 DIAGNOSIS — N6320 Unspecified lump in the left breast, unspecified quadrant: Secondary | ICD-10-CM

## 2016-08-19 ENCOUNTER — Ambulatory Visit
Admission: RE | Admit: 2016-08-19 | Discharge: 2016-08-19 | Disposition: A | Discharge status: Home / Self Care | Payer: Medicare Other | Source: Ambulatory Visit | Attending: Obstetrics and Gynecology | Admitting: Obstetrics and Gynecology

## 2016-08-19 DIAGNOSIS — N6320 Unspecified lump in the left breast, unspecified quadrant: Secondary | ICD-10-CM

## 2016-08-19 DIAGNOSIS — N6322 Unspecified lump in the left breast, upper inner quadrant: Secondary | ICD-10-CM | POA: Diagnosis not present

## 2016-08-19 DIAGNOSIS — R928 Other abnormal and inconclusive findings on diagnostic imaging of breast: Secondary | ICD-10-CM

## 2016-08-24 DIAGNOSIS — T148XXA Other injury of unspecified body region, initial encounter: Secondary | ICD-10-CM | POA: Diagnosis not present

## 2016-08-24 DIAGNOSIS — Z7901 Long term (current) use of anticoagulants: Secondary | ICD-10-CM | POA: Diagnosis not present

## 2016-08-24 DIAGNOSIS — D508 Other iron deficiency anemias: Secondary | ICD-10-CM | POA: Diagnosis not present

## 2016-08-26 ENCOUNTER — Emergency Department (HOSPITAL_COMMUNITY): Payer: Medicare Other

## 2016-08-26 ENCOUNTER — Encounter (HOSPITAL_COMMUNITY): Payer: Self-pay | Admitting: Emergency Medicine

## 2016-08-26 ENCOUNTER — Inpatient Hospital Stay (HOSPITAL_COMMUNITY)
Admission: EM | Admit: 2016-08-26 | Discharge: 2016-08-30 | DRG: 920 | Disposition: A | Discharge status: Home / Self Care | Payer: Medicare Other | Attending: Family Medicine | Admitting: Family Medicine

## 2016-08-26 DIAGNOSIS — F319 Bipolar disorder, unspecified: Secondary | ICD-10-CM | POA: Diagnosis present

## 2016-08-26 DIAGNOSIS — Z7984 Long term (current) use of oral hypoglycemic drugs: Secondary | ICD-10-CM

## 2016-08-26 DIAGNOSIS — I110 Hypertensive heart disease with heart failure: Secondary | ICD-10-CM | POA: Diagnosis present

## 2016-08-26 DIAGNOSIS — T148XXA Other injury of unspecified body region, initial encounter: Secondary | ICD-10-CM | POA: Diagnosis not present

## 2016-08-26 DIAGNOSIS — D509 Iron deficiency anemia, unspecified: Secondary | ICD-10-CM

## 2016-08-26 DIAGNOSIS — I2510 Atherosclerotic heart disease of native coronary artery without angina pectoris: Secondary | ICD-10-CM | POA: Diagnosis present

## 2016-08-26 DIAGNOSIS — Z885 Allergy status to narcotic agent status: Secondary | ICD-10-CM

## 2016-08-26 DIAGNOSIS — J90 Pleural effusion, not elsewhere classified: Secondary | ICD-10-CM | POA: Diagnosis not present

## 2016-08-26 DIAGNOSIS — Y838 Other surgical procedures as the cause of abnormal reaction of the patient, or of later complication, without mention of misadventure at the time of the procedure: Secondary | ICD-10-CM | POA: Diagnosis present

## 2016-08-26 DIAGNOSIS — R791 Abnormal coagulation profile: Secondary | ICD-10-CM | POA: Diagnosis present

## 2016-08-26 DIAGNOSIS — I5032 Chronic diastolic (congestive) heart failure: Secondary | ICD-10-CM | POA: Diagnosis present

## 2016-08-26 DIAGNOSIS — E119 Type 2 diabetes mellitus without complications: Secondary | ICD-10-CM | POA: Diagnosis present

## 2016-08-26 DIAGNOSIS — N6489 Other specified disorders of breast: Secondary | ICD-10-CM | POA: Diagnosis not present

## 2016-08-26 DIAGNOSIS — N99840 Postprocedural hematoma of a genitourinary system organ or structure following a genitourinary system procedure: Secondary | ICD-10-CM | POA: Diagnosis not present

## 2016-08-26 DIAGNOSIS — Z7901 Long term (current) use of anticoagulants: Secondary | ICD-10-CM

## 2016-08-26 DIAGNOSIS — Z952 Presence of prosthetic heart valve: Secondary | ICD-10-CM

## 2016-08-26 DIAGNOSIS — D62 Acute posthemorrhagic anemia: Secondary | ICD-10-CM | POA: Diagnosis not present

## 2016-08-26 DIAGNOSIS — Z79899 Other long term (current) drug therapy: Secondary | ICD-10-CM

## 2016-08-26 DIAGNOSIS — Z8249 Family history of ischemic heart disease and other diseases of the circulatory system: Secondary | ICD-10-CM

## 2016-08-26 DIAGNOSIS — E785 Hyperlipidemia, unspecified: Secondary | ICD-10-CM | POA: Diagnosis present

## 2016-08-26 DIAGNOSIS — L7632 Postprocedural hematoma of skin and subcutaneous tissue following other procedure: Secondary | ICD-10-CM | POA: Diagnosis not present

## 2016-08-26 DIAGNOSIS — M8008XA Age-related osteoporosis with current pathological fracture, vertebra(e), initial encounter for fracture: Secondary | ICD-10-CM | POA: Diagnosis present

## 2016-08-26 DIAGNOSIS — I482 Chronic atrial fibrillation: Secondary | ICD-10-CM | POA: Diagnosis present

## 2016-08-26 DIAGNOSIS — D649 Anemia, unspecified: Secondary | ICD-10-CM | POA: Diagnosis present

## 2016-08-26 DIAGNOSIS — Z950 Presence of cardiac pacemaker: Secondary | ICD-10-CM

## 2016-08-26 LAB — CBC WITH DIFFERENTIAL/PLATELET
BASOS ABS: 0 10*3/uL (ref 0.0–0.1)
BASOS PCT: 0 %
Eosinophils Absolute: 0 10*3/uL (ref 0.0–0.7)
Eosinophils Relative: 0 %
HEMATOCRIT: 19.7 % — AB (ref 36.0–46.0)
Hemoglobin: 6.9 g/dL — CL (ref 12.0–15.0)
LYMPHS ABS: 1 10*3/uL (ref 0.7–4.0)
Lymphocytes Relative: 8 %
MCH: 32.4 pg (ref 26.0–34.0)
MCHC: 35 g/dL (ref 30.0–36.0)
MCV: 92.5 fL (ref 78.0–100.0)
MONOS PCT: 10 %
Monocytes Absolute: 1.2 10*3/uL — ABNORMAL HIGH (ref 0.1–1.0)
NEUTROS ABS: 9.9 10*3/uL — AB (ref 1.7–7.7)
Neutrophils Relative %: 82 %
Platelets: 252 10*3/uL (ref 150–400)
RBC: 2.13 MIL/uL — ABNORMAL LOW (ref 3.87–5.11)
RDW: 14.2 % (ref 11.5–15.5)
WBC: 12.1 10*3/uL — ABNORMAL HIGH (ref 4.0–10.5)

## 2016-08-26 LAB — BASIC METABOLIC PANEL
Anion gap: 8 (ref 5–15)
BUN: 19 mg/dL (ref 6–20)
CALCIUM: 8.8 mg/dL — AB (ref 8.9–10.3)
CO2: 27 mmol/L (ref 22–32)
CREATININE: 0.95 mg/dL (ref 0.44–1.00)
Chloride: 94 mmol/L — ABNORMAL LOW (ref 101–111)
GFR calc non Af Amer: 56 mL/min — ABNORMAL LOW (ref >60)
GLUCOSE: 169 mg/dL — AB (ref 65–99)
Potassium: 4 mmol/L (ref 3.5–5.1)
Sodium: 129 mmol/L — ABNORMAL LOW (ref 135–145)

## 2016-08-26 LAB — PREPARE RBC (CROSSMATCH)

## 2016-08-26 LAB — PROTIME-INR
INR: 2.43
PROTHROMBIN TIME: 26.8 s — AB (ref 11.4–15.2)

## 2016-08-26 LAB — ABO/RH: ABO/RH(D): A POS

## 2016-08-26 MED ORDER — ONDANSETRON HCL 4 MG/2ML IJ SOLN
4.0000 mg | Freq: Once | INTRAMUSCULAR | Status: AC
  Administered 2016-08-26: 4 mg via INTRAVENOUS
  Filled 2016-08-26: qty 2

## 2016-08-26 MED ORDER — OXYCODONE HCL 5 MG PO TABS: 5.0000 mg | ORAL_TABLET | ORAL | Status: DC | PRN

## 2016-08-26 MED ORDER — AMLODIPINE BESYLATE 5 MG PO TABS
5.0000 mg | ORAL_TABLET | Freq: Every day | ORAL | Status: DC
  Administered 2016-08-27 – 2016-08-30 (×4): 5 mg via ORAL
  Filled 2016-08-26 (×4): qty 1

## 2016-08-26 MED ORDER — ACETAMINOPHEN 325 MG PO TABS
650.0000 mg | ORAL_TABLET | Freq: Once | ORAL | Status: AC
  Administered 2016-08-26: 650 mg via ORAL
  Filled 2016-08-26: qty 2

## 2016-08-26 MED ORDER — IOPAMIDOL (ISOVUE-300) INJECTION 61%
INTRAVENOUS | Status: AC
  Administered 2016-08-26: 75 mL
  Filled 2016-08-26: qty 75

## 2016-08-26 MED ORDER — SODIUM CHLORIDE 0.9 % IV SOLN
10.0000 mL/h | Freq: Once | INTRAVENOUS | Status: AC
  Administered 2016-08-26: 10 mL/h via INTRAVENOUS

## 2016-08-26 MED ORDER — SODIUM CHLORIDE 0.9% FLUSH
3.0000 mL | Freq: Two times a day (BID) | INTRAVENOUS | Status: DC
  Administered 2016-08-27 – 2016-08-30 (×6): 3 mL via INTRAVENOUS

## 2016-08-26 MED ORDER — ACETAMINOPHEN 325 MG PO TABS
650.0000 mg | ORAL_TABLET | ORAL | Status: DC | PRN
  Administered 2016-08-26: 650 mg via ORAL
  Filled 2016-08-26: qty 2

## 2016-08-26 MED ORDER — AMITRIPTYLINE HCL 10 MG PO TABS
10.0000 mg | ORAL_TABLET | Freq: Every evening | ORAL | Status: DC | PRN
  Filled 2016-08-26: qty 1

## 2016-08-26 MED ORDER — ACETAMINOPHEN 500 MG PO TABS
500.0000 mg | ORAL_TABLET | Freq: Four times a day (QID) | ORAL | Status: DC | PRN
  Administered 2016-08-29: 1000 mg via ORAL
  Filled 2016-08-26: qty 2

## 2016-08-26 MED ORDER — SORBITOL 70 % SOLN
30.0000 mL | Freq: Every day | Status: DC | PRN
  Administered 2016-08-27: 30 mL via ORAL
  Filled 2016-08-26 (×2): qty 30

## 2016-08-26 MED ORDER — PAROXETINE HCL 20 MG PO TABS
40.0000 mg | ORAL_TABLET | Freq: Every day | ORAL | Status: DC
  Administered 2016-08-27 – 2016-08-30 (×4): 40 mg via ORAL
  Filled 2016-08-26 (×4): qty 2

## 2016-08-26 MED ORDER — MAGNESIUM CITRATE PO SOLN: 1.0000 | Freq: Once | ORAL | Status: DC | PRN

## 2016-08-26 NOTE — H&P (Signed)
Courtney Evans [id number] DOB: [birth year] DOA: 08/26/2016  Referring physician: Venora Maples PCP: Cari Caraway, MD  Specialists:  None yet  Chief Complaint: sympt anemia  HPI:  81 year old female Prior history of rheumatic Valvular heart disease status post mechanical aortic/mitral valve replacement in [place] 94 Chronic atrial fibrillation [place] score >4 on Coumadin, Status post single chamber Medtronic single-chamber pacemaker 2013 Hypertension Diabetes mellitus Compensated diastolic heart failure Osteoporosis with vertebral compression fractures multiple keratoses Hyperlipidemia  Patient had a breast biopsy around 08/22/2016 progressed to have some swelling and induration and bleeding from the site of the biopsy. Patient went to see her primary physician found to have a hemoglobin of 9 INR of 3 and was told to hold Coumadin but came to the ED for recheck and was found to have a hemoglobin of 6  A CT scan was done as above showing subacute blood products with breast enlarged 12.8 7 cm and no active leading  Sodium 129,  BUn/Creat 19/0.9   Review of Systems: The patient denies fever or chills blurred vision double vision Started on Ceftin 2/27 and has had nausea as well as constant H  Past Medical History:  Diagnosis Date  . A-fib ([place])   . Compression fracture   . Coronary artery disease   . Diabetes mellitus without complication ([place])   . Hyperlipidemia   . Hypertension   . Pacemaker    Past Surgical History:  Procedure Laterality Date  . AORTIC AND MITRAL VALVE REPLACEMENT  1994   w/ mechanical valves at [facility]  . CARDIAC SURGERY    . KYPHOPLASTY  2011   1 - at [facility]  . KYPHOPLASTY  2012   2 - at [facility]  . KYPHOPLASTY  2013   1 - at [facility]  . KYPHOPLASTY  05/2015   1 - at [place]  . PACEMAKER GENERATOR CHANGE  2013  . [place]   Social History:  reports that she has never smoked. She does not have any smokeless tobacco history on file. She reports that she does not drink alcohol or use drugs. 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 Codeine Nausea Only    Family History  Problem Relation Age of Onset  . Heart attack Mother   . Stroke Father      Prior to Admission medications   Medication Sig Start Date End Date Taking? Authorizing Provider  acetaminophen (TYLENOL) 500 MG tablet Take 500-1,000 mg by mouth every 6 (six) hours as needed for mild pain, moderate pain, fever or headache.   Yes Historical Provider, MD  amitriptyline (ELAVIL) 10 MG tablet Take 10 mg by mouth at bedtime as needed for sleep. Reported on 11/19/2015 12/08/09  Yes Historical Provider, MD  amLODipine (NORVASC) 5 MG tablet Take 5 mg by mouth daily with breakfast.    Yes Historical Provider, MD  atorvastatin (LIPITOR) 20 MG tablet Take 20 mg by mouth daily with breakfast.    Yes Historical Provider, MD  cefUROXime (CEFTIN) 500 MG tablet Take 500 mg by mouth 2 (two) times daily. Started 02/28 for 10 days 08/24/16  Yes Historical Provider, MD  cholecalciferol (VITAMIN D) 1000 units tablet Take 2,000 Units by mouth daily with breakfast.  Yes Historical Provider, MD  metFORMIN (GLUCOPHAGE) 500 MG tablet Take 500 mg by mouth 2 (two) times daily with a meal.   Yes Historical Provider, MD  Multiple Vitamins-Minerals (ICAPS) CAPS Take 1 capsule by mouth daily with breakfast.   Yes Historical Provider, MD  nitroGLYCERIN (NITROSTAT) 0.4 MG SL tablet Place 0.4 mg under the tongue every 5 (five) minutes as needed for chest pain.   Yes Historical Provider, MD  PARoxetine (PAXIL) 40 MG tablet Take 40 mg by mouth daily with breakfast.  05/10/10  Yes Historical Provider, MD  warfarin (COUMADIN) 3 MG tablet Take 1.5-3 mg by mouth  every evening. Takes 3 mg everyday except 1.5mg  on Monday's 12/08/09  Yes Historical Provider, MD   Physical Exam: Vitals:   08/26/16 1429 08/26/16 1436  BP: 164/71 164/71  Pulse: 88 92  Resp: (!) 27 18  Temp: 98.8 F (37.1 C) 98.8 F (37.1 C)     pleasant oriented no pallor no icterus partial denture upper teeth no JVD left breast is usually distended engorged but nontender no fluctuance noted S1-S2 no murmur rub or gallop abdomen soft nontender no rebound no guarding rectal deferred no lower extremity edema significant ecchymosis over the frontal chest Power 5/5 moving all 4 limbs equally symmetric Labs on Admission:  Basic Metabolic Panel:  Recent Labs Lab 08/26/16 1247  NA 129*  K 4.0  CL 94*  CO2 27  GLUCOSE 169*  BUN 19  CREATININE 0.95  CALCIUM 8.8*   Liver Function Tests: No results for input(s): AST, ALT, ALKPHOS, BILITOT, PROT, ALBUMIN in the last 168 hours. No results for input(s): LIPASE, AMYLASE in the last 168 hours. No results for input(s): AMMONIA in the last 168 hours. CBC:  Recent Labs Lab 08/26/16 1247  WBC 12.1*  NEUTROABS 9.9*  HGB 6.9*  HCT 19.7*  MCV 92.5  PLT 252   Cardiac Enzymes: No results for input(s): CKTOTAL, CKMB, CKMBINDEX, TROPONINI in the last 168 hours.  BNP (last 3 results) No results for input(s): BNP in the last 8760 hours.  ProBNP (last 3 results) No results for input(s): PROBNP in the last 8760 hours.  CBG: No results for input(s): GLUCAP in the last 168 hours.  Radiological Exams on Admission: Ct Chest W Contrast  Result Date: 08/26/2016 CLINICAL DATA:  Worsening swelling and hematoma left breast, recent left breast biopsy, decreased hemoglobin, question active bleeding on Coumadin EXAM: CT CHEST WITH CONTRAST TECHNIQUE: Multidetector CT imaging of the chest was performed during intravenous contrast administration. CONTRAST:  1 ISOVUE-300 IOPAMIDOL (ISOVUE-300) INJECTION 61% COMPARISON:  Chest x-ray 11/20/2015  FINDINGS: Cardiovascular: Cardiomegaly is noted. Mitral valve prosthesis. Single lead cardiac pacemaker in place with tip in right ventricle. No pericardial effusion. Atherosclerotic calcifications of thoracic aorta. The patient is status post aortic valve replacement. Mediastinum/Nodes: No mediastinal hematoma or adenopathy. No hilar adenopathy. Central airways are patent. Lungs/Pleura: There is bilateral small pleural effusion. Bilateral lower lobe posterior atelectasis. No pulmonary edema. No segmental infiltrates. The patient is status post median sternotomy. Mild calcified pleural thickening in left upper lobe posteriorly see axial image 31. Upper Abdomen: The visualized upper abdomen shows no adrenal gland mass. Visualized pancreas, spleen liver and upper kidneys are unremarkable. Musculoskeletal: The patient is status post recent left breast biopsy. There is a large hematoma with subacute blood products in left breast Measures 12.8 x 7.4 cm. On coronal image 22 the hematoma measures 11 cm cranial caudally by 10 cm transverse diameter. There is no evidence of active  contrast extravasation to suggest active acute bleeding. There is mild superior displacement/ mass effect of cardiac pacemaker in left anterior chest wall. No acute rib fractures are noted. Sagittal images of the spine shows diffuse osteopenia. Multilevel compression deformities thoracic spine. Multilevel prior vertebroplasty mid and lower thoracic spine. IMPRESSION: 1. The patient is status post recent left breast biopsy. There is a large hematoma with subacute blood products in left breast Measures 12.8 x 7.4 cm. On coronal image 22 the hematoma measures 11 cm cranial caudally by 10 cm transverse diameter. There is no evidence of active contrast extravasation to suggest active acute bleeding. There is mild superior displacement/ mass effect on cardiac pacemaker in left anterior chest wall. 2. Bilateral small pleural effusion. Bilateral lower lobe  posterior atelectasis. No pulmonary edema. No segmental infiltrates. 3. Cardiomegaly. Status post aortic and mitral valve replacement. Status post median sternotomy. 4. Multilevel compression deformities and prior vertebroplasty mid and lower thoracic spine. These results were called by telephone at the time of interpretation on 08/26/2016 at 2:29 pm to Dr. Jola Schmidt , who verbally acknowledged these results. Electronically Signed   By: Lahoma Crocker M.D.   On: 08/26/2016 14:30    EKG: Independently reviewed. None  Assessment/Plan Active Problems:   * No active hospital problems. *  Anemia secondary to recent breast biopsy in setting of systemic anticoagulation-chest using 2 units PRBC, follow CBC trend, holding Coumadin today and will resume problem really 08/27/2016. Might need bridging with heparin. We'll discussed with pharmacist a.m.CT of the chest negative for extravasation as well as active lead into breast- Possible infection of breast hematoma-Ceftin/Cefizox and was causing constipation and nausea-hold the same. Start ceftriaxone 1 g every 20. Monitor.-As is allergic to codeine will place on scheduled Tylenol for pain-field with CTs nonsteroidal in the setting given risk of bleed Rheumatic valvular heart disease with mechanical valve 1994-difficult situation. On discussion with family about risk of stroke and patient not anticoagulated but risk of bleed if continued use of Coumadin.  Hypertension continue amlodipine 5 mg Bipolar continue Elavil 10 daily at bedtime for sleep, Paxil 40 every morning Diabetes mellitus type 2-holding metformin 500 twice a day-place on regular diet for now. Seems well controlled baseline Holding statin, vitamin D for now   Time spent: 66  Nita Sells [facility] Pager [phone]  If 7PM-7AM, please contact night-coverage [email or web address] Password [email or web address] 08/26/2016, 2:42 PM

## 2016-08-26 NOTE — ED Triage Notes (Addendum)
Pt had biopsy to left breast last Thursday; present hemoglobin dropped from 9 to 6; concern for internal bleeding.

## 2016-08-26 NOTE — ED Provider Notes (Signed)
[place] DEPT Provider Note   CSN: [id number] Arrival date & time: 08/26/16  1136     History   Chief Complaint Chief Complaint  Patient presents with  . Abnormal Lab    HPI Courtney Evans is a 81 y.o. female.  HPI Patient presents to the emergency department 8 days out from a left breast biopsy which was found to be benign.  She is on Coumadin for history of mechanical valve.  She presents from her primary care doctor's office after hemoglobin was found to drop from 9-6 in the past 2 days.  She reports increasing swelling and bruising of her left breast and anterior right chest.  Her INR was found to be 4.52 days ago and her Coumadin has been held since then.  No oral vitamin K her other medications given to lower her INR.  No fevers or chills.  No redness.  She was started on antibiotics 2 days ago for possible cellulitis of the left breast.  Today the patient reports feeling weak and somewhat lightheaded.  No syncope.  Hemoglobin found to be 6.9 at the office today and transferred to the emergency department for evaluation and blood transfusion and admission to the hospital.  The breast [facility]   Past Medical History:  Diagnosis Date  . A-fib ([place])   . Compression fracture   . Coronary artery disease   . Diabetes mellitus without complication ([place])   . Hyperlipidemia   . Hypertension   . Pacemaker     Patient Active Problem List   Diagnosis Date Noted  . Rheumatic heart disease 02/19/2016  . LBBB (left bundle branch block) 02/19/2016  . Pneumonia 11/19/2015  . Sepsis due to pneumonia ([place]) 11/19/2015  . CAP (community acquired pneumonia) 11/19/2015  . Coagulopathy ([place]) 11/19/2015  . Chronic atrial fibrillation ([place]) 11/19/2015  . Lactic acidosis 11/19/2015  . Mechanical heart valve present 11/19/2015    Past Surgical History:  Procedure Laterality Date  . AORTIC AND MITRAL VALVE REPLACEMENT  1994   w/ mechanical  valves at [facility]  . CARDIAC SURGERY    . KYPHOPLASTY  2011   1 - at [facility]  . KYPHOPLASTY  2012   2 - at [facility]  . KYPHOPLASTY  2013   1 - at [facility]  . KYPHOPLASTY  05/2015   1 - at [place]  . PACEMAKER GENERATOR CHANGE  2013  . [place]    [place] History    No data available       Home Medications    Prior to Admission medications   Medication Sig Start Date End Date Taking? Authorizing Provider  acetaminophen (TYLENOL) 500 MG tablet Take 500-1,000 mg by mouth every 6 (six) hours as needed for mild pain, moderate pain, fever or headache.   Yes Historical Provider, MD  amitriptyline (ELAVIL) 10 MG tablet Take 10 mg by mouth at bedtime as needed for sleep. Reported on 11/19/2015 12/08/09  Yes Historical Provider, MD  amLODipine (NORVASC) 5 MG tablet Take 5 mg by mouth daily with breakfast.    Yes Historical Provider, MD  atorvastatin (LIPITOR) 20 MG tablet Take 20 mg by mouth daily with breakfast.    Yes Historical Provider, MD  cefUROXime (CEFTIN) 500 MG tablet Take 500 mg by mouth 2 (two) times daily.  Started 02/28 for 10 days 08/24/16  Yes Historical Provider, MD  cholecalciferol (VITAMIN D) 1000 units tablet Take 2,000 Units by mouth daily with breakfast.   Yes Historical Provider, MD  metFORMIN (GLUCOPHAGE) 500 MG tablet Take 500 mg by mouth 2 (two) times daily with a meal.   Yes Historical Provider, MD  Multiple Vitamins-Minerals (ICAPS) CAPS Take 1 capsule by mouth daily with breakfast.   Yes Historical Provider, MD  nitroGLYCERIN (NITROSTAT) 0.4 MG SL tablet Place 0.4 mg under the tongue every 5 (five) minutes as needed for chest pain.   Yes Historical Provider, MD  PARoxetine (PAXIL) 40 MG tablet Take 40 mg by mouth daily with breakfast.  05/10/10  Yes Historical Provider, MD  warfarin (COUMADIN) 3 MG tablet Take  1.5-3 mg by mouth every evening. Takes 3 mg everyday except 1.5mg  on Monday's 12/08/09  Yes Historical Provider, MD    Family History Family History  Problem Relation Age of Onset  . Heart attack Mother   . Stroke Father     Social History Social History  Substance Use Topics  . Smoking status: Never Smoker  . Smokeless tobacco: Not on file  . Alcohol use No     Allergies   Codeine   Review of Systems Review of Systems  All other systems reviewed and are negative.    Physical Exam Updated Vital Signs BP 164/71 (BP Location: Right Arm)   Pulse 92   Temp 98.8 F (37.1 C) (Axillary)   Resp 18   Wt 134 lb (60.8 kg)   SpO2 92%   BMI 23.00 kg/m   Physical Exam  Constitutional: She is oriented to person, place, and time. She appears well-developed and well-nourished.  HENT:  Head: Normocephalic.  Eyes: EOM are normal.  Neck: Normal range of motion.  Cardiovascular: Normal rate.   Pulmonary/Chest: Effort normal and breath sounds normal.  Abdominal: She exhibits no distension.  Musculoskeletal: Normal range of motion.  Large obvious swelling of the left breast consistent with left breast hematoma.  Significant ecchymosis involving all of her anterior chest mainly located on left side but also present on the right.  No active bleeding from the biopsy site.  Neurological: She is alert and oriented to person, place, and time.  Psychiatric: She has a normal mood and affect.  Nursing note and vitals reviewed.    ED Treatments / Results  Labs (all labs ordered are listed, but only abnormal results are displayed) Labs Reviewed  CBC WITH DIFFERENTIAL/PLATELET - Abnormal; Notable for the following:       Result Value   WBC 12.1 (*)    RBC 2.13 (*)    Hemoglobin 6.9 (*)    HCT 19.7 (*)    Neutro Abs 9.9 (*)    Monocytes Absolute 1.2 (*)    All other components within normal limits  BASIC METABOLIC PANEL - Abnormal; Notable for the following:    Sodium 129 (*)     Chloride 94 (*)    Glucose, Bld 169 (*)    Calcium 8.8 (*)    GFR calc non Af Amer 56 (*)    All other components within normal limits  PROTIME-INR  PREPARE RBC (CROSSMATCH)  TYPE AND SCREEN  ABO/RH   Hemoglobin  Date Value Ref Range Status  08/26/2016 6.9 (LL) 12.0 - 15.0 g/dL Final    Comment:    CRITICAL RESULT CALLED TO, READ BACK BY AND VERIFIED WITH: M.ROSSERN RN AT 1310 ON 08/26/16 BY S.VANHOORNE  11/20/2015 12.6 12.0 - 15.0 g/dL Final  11/19/2015 12.9 12.0 - 15.0 g/dL Final     EKG  EKG Interpretation None       Radiology Ct Chest W Contrast  Result Date: 08/26/2016 CLINICAL DATA:  Worsening swelling and hematoma left breast, recent left breast biopsy, decreased hemoglobin, question active bleeding on Coumadin EXAM: CT CHEST WITH CONTRAST TECHNIQUE: Multidetector CT imaging of the chest was performed during intravenous contrast administration. CONTRAST:  1 ISOVUE-300 IOPAMIDOL (ISOVUE-300) INJECTION 61% COMPARISON:  Chest x-ray 11/20/2015 FINDINGS: Cardiovascular: Cardiomegaly is noted. Mitral valve prosthesis. Single lead cardiac pacemaker in place with tip in right ventricle. No pericardial effusion. Atherosclerotic calcifications of thoracic aorta. The patient is status post aortic valve replacement. Mediastinum/Nodes: No mediastinal hematoma or adenopathy. No hilar adenopathy. Central airways are patent. Lungs/Pleura: There is bilateral small pleural effusion. Bilateral lower lobe posterior atelectasis. No pulmonary edema. No segmental infiltrates. The patient is status post median sternotomy. Mild calcified pleural thickening in left upper lobe posteriorly see axial image 31. Upper Abdomen: The visualized upper abdomen shows no adrenal gland mass. Visualized pancreas, spleen liver and upper kidneys are unremarkable. Musculoskeletal: The patient is status post recent left breast biopsy. There is a large hematoma with subacute blood products in left breast Measures 12.8 x 7.4  cm. On coronal image 22 the hematoma measures 11 cm cranial caudally by 10 cm transverse diameter. There is no evidence of active contrast extravasation to suggest active acute bleeding. There is mild superior displacement/ mass effect of cardiac pacemaker in left anterior chest wall. No acute rib fractures are noted. Sagittal images of the spine shows diffuse osteopenia. Multilevel compression deformities thoracic spine. Multilevel prior vertebroplasty mid and lower thoracic spine. IMPRESSION: 1. The patient is status post recent left breast biopsy. There is a large hematoma with subacute blood products in left breast Measures 12.8 x 7.4 cm. On coronal image 22 the hematoma measures 11 cm cranial caudally by 10 cm transverse diameter. There is no evidence of active contrast extravasation to suggest active acute bleeding. There is mild superior displacement/ mass effect on cardiac pacemaker in left anterior chest wall. 2. Bilateral small pleural effusion. Bilateral lower lobe posterior atelectasis. No pulmonary edema. No segmental infiltrates. 3. Cardiomegaly. Status post aortic and mitral valve replacement. Status post median sternotomy. 4. Multilevel compression deformities and prior vertebroplasty mid and lower thoracic spine. These results were called by telephone at the time of interpretation on 08/26/2016 at 2:29 pm to Dr. Jola Schmidt , who verbally acknowledged these results. Electronically Signed   By: Lahoma Crocker M.D.   On: 08/26/2016 14:30    Procedures Procedures (including critical care time)    ++++++++++++++++++++++++++++++++++++++++++++++++++++++++++  CRITICAL CARE Performed by: Hoy Morn Total critical care time: 31 minutes Critical care time was exclusive of separately billable procedures and treating other patients. Critical care was necessary to treat or prevent imminent or life-threatening deterioration. Critical care was time spent personally by me on the following activities:  development of treatment plan with patient and/or surrogate as well as nursing, discussions with consultants, evaluation of patient's response to treatment, examination of patient, obtaining history from patient or surrogate, ordering and performing treatments and interventions, ordering and review of laboratory studies, ordering and review of radiographic studies, pulse oximetry and re-evaluation of patient's condition.  ++++++++++++++++++++++++++++++++++++++++++++++++++++++    Medications Ordered in ED Medications  0.9 %  sodium chloride infusion (not administered)  ondansetron (ZOFRAN) injection 4 mg (4 mg Intravenous Given 08/26/16 1424)  acetaminophen (  TYLENOL) tablet 650 mg (650 mg Oral Given 08/26/16 1424)  iopamidol (ISOVUE-300) 61 % injection (75 mLs  Contrast Given 08/26/16 1359)     Initial Impression / Assessment and Plan / ED Course  I have reviewed the triage vital signs and the nursing notes.  Pertinent labs & imaging results that were available during my care of the patient were reviewed by me and considered in my medical decision making (see chart for details).     CT scan demonstrates no obvious active bleeding/active extravasation at this time on her left breast.  I suspect that the left breast bleeding will resolve as her INR continues to fall and the breast hematoma tamponades itself.  Hemodynamically stable.  Blood transfusion now.  Final Clinical Impressions(s) / ED Diagnoses   Final diagnoses:  Postoperative anemia due to acute blood loss  Hematoma of breast    New Prescriptions New Prescriptions   No medications on file     Jola Schmidt, MD 08/26/16 1732

## 2016-08-26 NOTE — ED Notes (Signed)
Bed: WA21 Expected date:  Expected time:  Means of arrival:  Comments: POV Abnormal Lab

## 2016-08-26 NOTE — ED Notes (Signed)
PT NOT IN ROOM

## 2016-08-27 LAB — BPAM RBC
Blood Product Expiration Date: 201803282359
Blood Product Expiration Date: 201803282359
ISSUE DATE / TIME: 201803021410
ISSUE DATE / TIME: 201803021816
UNIT TYPE AND RH: 6200
Unit Type and Rh: 6200

## 2016-08-27 LAB — TYPE AND SCREEN
ABO/RH(D): A POS
ANTIBODY SCREEN: NEGATIVE
UNIT DIVISION: 0
Unit division: 0

## 2016-08-27 LAB — CBC
HCT: 24.7 % — ABNORMAL LOW (ref 36.0–46.0)
Hemoglobin: 8.7 g/dL — ABNORMAL LOW (ref 12.0–15.0)
MCH: 31.2 pg (ref 26.0–34.0)
MCHC: 35.2 g/dL (ref 30.0–36.0)
MCV: 88.5 fL (ref 78.0–100.0)
PLATELETS: 187 10*3/uL (ref 150–400)
RBC: 2.79 MIL/uL — AB (ref 3.87–5.11)
RDW: 15.4 % (ref 11.5–15.5)
WBC: 8.3 10*3/uL (ref 4.0–10.5)

## 2016-08-27 LAB — BASIC METABOLIC PANEL
Anion gap: 6 (ref 5–15)
BUN: 16 mg/dL (ref 6–20)
CO2: 27 mmol/L (ref 22–32)
CREATININE: 0.86 mg/dL (ref 0.44–1.00)
Calcium: 8.2 mg/dL — ABNORMAL LOW (ref 8.9–10.3)
Chloride: 99 mmol/L — ABNORMAL LOW (ref 101–111)
GFR calc Af Amer: >60 mL/min (ref >60)
GLUCOSE: 108 mg/dL — AB (ref 65–99)
Potassium: 3.8 mmol/L (ref 3.5–5.1)
SODIUM: 132 mmol/L — AB (ref 135–145)

## 2016-08-27 LAB — PROTIME-INR
INR: 2.16
PROTHROMBIN TIME: 24.4 s — AB (ref 11.4–15.2)

## 2016-08-27 MED ORDER — WARFARIN - PHARMACIST DOSING INPATIENT: Freq: Every day | Status: DC

## 2016-08-27 MED ORDER — WARFARIN SODIUM 3 MG PO TABS
3.0000 mg | ORAL_TABLET | Freq: Once | ORAL | Status: AC
  Administered 2016-08-27: 3 mg via ORAL
  Filled 2016-08-27: qty 1

## 2016-08-27 NOTE — Progress Notes (Signed)
[place] for Warfarin Indication: MVR, AVR, Afib  Allergies  Allergen Reactions  . Codeine Nausea Only    Patient Measurements: Height: 5\' 4"  (162.6 cm) Weight: 136 lb 3.9 oz (61.8 kg) IBW/kg (Calculated) : 54.7  Vital Signs: Temp: 98 F (36.7 C) (03/03 0404) Temp Source: Oral (03/03 0404) BP: 138/65 (03/02 2130) Pulse Rate: 79 (03/03 0404)  Labs:  Recent Labs  08/26/16 1247 08/26/16 1412 08/27/16 0128  HGB 6.9*  --  8.7*  HCT 19.7*  --  24.7*  PLT 252  --  187  LABPROT  --  26.8* 24.4*  INR  --  2.43 2.16  CREATININE 0.95  --  0.86    Estimated Creatinine Clearance: 46.6 mL/min (by C-G formula based on SCr of 0.86 mg/dL).   Medical History: Past Medical History:  Diagnosis Date  . A-fib ([place])   . Compression fracture   . Coronary artery disease   . Diabetes mellitus without complication ([place])   . Hyperlipidemia   . Hypertension   . Pacemaker     Medications:  Prescriptions Prior to Admission  Medication Sig Dispense Refill Last Dose  . acetaminophen (TYLENOL) 500 MG tablet Take 500-1,000 mg by mouth every 6 (six) hours as needed for mild pain, moderate pain, fever or headache.   08/25/2016 at Unknown time  . amitriptyline (ELAVIL) 10 MG tablet Take 10 mg by mouth at bedtime as needed for sleep. Reported on 11/19/2015   2 months  . amLODipine (NORVASC) 5 MG tablet Take 5 mg by mouth daily with breakfast.    08/26/2016 at Unknown time  . atorvastatin (LIPITOR) 20 MG tablet Take 20 mg by mouth daily with breakfast.    08/26/2016 at Unknown time  . cefUROXime (CEFTIN) 500 MG tablet Take 500 mg by mouth 2 (two) times daily. Started 02/28 for 10 days   08/26/2016 at am  . cholecalciferol (VITAMIN D) 1000 units tablet Take 2,000 Units by mouth daily with breakfast.   08/26/2016 at Unknown time  . metFORMIN (GLUCOPHAGE) 500 MG tablet Take 500 mg by mouth 2 (two) times daily with a meal.   08/26/2016 at am  . Multiple Vitamins-Minerals  (ICAPS) CAPS Take 1 capsule by mouth daily with breakfast.   08/26/2016 at Unknown time  . nitroGLYCERIN (NITROSTAT) 0.4 MG SL tablet Place 0.4 mg under the tongue every 5 (five) minutes as needed for chest pain.   unknown  . PARoxetine (PAXIL) 40 MG tablet Take 40 mg by mouth daily with breakfast.    08/26/2016 at Unknown time  . warfarin (COUMADIN) 3 MG tablet Take 1.5-3 mg by mouth every evening. Takes 3 mg everyday except 1.5mg  on Monday's   08/24/2016   Scheduled:  . amLODipine  5 mg Oral Q breakfast  . PARoxetine  40 mg Oral Q breakfast  . sodium chloride flush  3 mL Intravenous Q12H   Infusions:   Assessment: 57 yoF with Afib and Hx rheumatic heart disease with MVR and AVR on warfarin, HTN, DM, dCHF, admitted for symptomatic anemia. Patient recently had breast Bx in Feb and noted with bleeding from Bx site. Now with Hgb 6 on admission; pharmacy to resume warfarin for above indications.   Baseline INR SUBtherapeutic  Prior anticoagulation: warfarin 3 mg daily except 1.5 mg on Monday, LD 2/28  Significant events:  Today, 08/27/2016:  CBC: Hgb improved after 3 units PRBC; Plt wnl  INR SUBtherapeutic  Major drug interactions: none  Per CT no  active bleeding or extravasation into chest, but large hematoma present  Eating 25% of meals  Goal of Therapy: INR 2.5-3 - normally 2.5-3.5 but MD would like conservative range with bleeding  Plan:  Warfarin 3 mg PO today  Discussed need for bridging with MD and wants to hold off on IV heparin for now with bleeding risk. If INR < 2.5 tomorrow, will start heparin gtt.  Daily INR  Will check baseline aPTT in case heparin started tomorrow  CBC at least q72 hr while on warfarin  Monitor for signs of bleeding or thrombosis   Reuel Boom, PharmD Pager: [phone] 08/27/2016, 8:37 AM

## 2016-08-27 NOTE — Progress Notes (Signed)
SATURATION QUALIFICATIONS: (This note is used to comply with regulatory documentation for home oxygen)  Patient Saturations on Room Air at Rest = 86%   Please briefly explain why patient needs home oxygen: 

## 2016-08-27 NOTE — Progress Notes (Signed)
PROGRESS NOTE    Courtney Evans  [id number] DOB: [birth year] DOA: 08/26/2016 PCP: Cari Caraway, MD  Outpatient Specialists:     Brief Narrative:  81 year old female Prior history of rheumatic Valvular heart disease status post mechanical aortic/mitral valve replacement in 94 Chronic atrial fibrillation [place] score >4 on Coumadin, Status post single chamber Medtronic single-chamber pacemaker 2013 Hypertension Diabetes mellitus Compensated diastolic heart failure Osteoporosis with vertebral compression fractures multiple keratoses Hyperlipidemia  Patient had a breast biopsy around 08/22/2016 progressed to have some swelling and induration and bleeding from the site of the biopsy. Patient went to see her primary physician found to have a hemoglobin of 9 INR of 3 and was told to hold Coumadin but came to the ED for recheck and was found to have a hemoglobin of 6    Assessment & Plan:   Active Problems:   Anemia   Anemia secondary to recent breast biopsy in setting of systemic anticoagulation-chest using 2 units PRBC--  Resume Coumadin 08/27/2016. --if below 2.5 3/4, Hep bridge 3/4.  CT negative for extravasation as well as active lead into breast--Hb stable 8 range repeat in am while resuming coumadin Chest pain overnight 3/2 Possible infection of breast hematoma-Ceftin/Cefizox and was causing constipation and nausea-hold the same. Start ceftriaxone 1 g every 20. Monitor-As is allergic to codeine will place on scheduled Tylenol for pain- nonsteroidal in the setting given risk of bleed Rheumatic valvular heart disease with mechanical valve 1994-difficult situation. On discussion with family about risk of stroke and patient not anticoagulated but risk of bleed if continued use of Coumadin.  Hypertension continue amlodipine 5 mg-trends reasonable Bipolar continue Elavil 10 daily at bedtime for sleep, Paxil 40 every morning Diabetes mellitus type 2-holding metformin 500 twice a day-place  on regular diet for now. Seems well controlled baseline 100-170 Holding statin, vitamin D for now    DVT prophylaxis: coumadin resumed 3/3-see above Code Status: Full Family Communication: d/w daughter 3/3 Hilda Blades [phone] Disposition Plan: 24-48 hr-likely home   Consultants:   none  Procedures:   none  Antimicrobials:   none    Subjective:  Awake alert in nad tol diet no distress some pain outer lower breast quadrant No other issue  Objective: Vitals:   08/26/16 1836 08/26/16 1843 08/26/16 2130 08/27/16 0404  BP:  (!) 141/70 138/65   Pulse:  90 73 79  Resp:  18 16 18   Temp:  97.6 F (36.4 C) 98.5 F (36.9 C) 98 F (36.7 C)  TempSrc:  Axillary Oral Oral  SpO2: 94% 96% 96% 95%  Weight:      Height:        Intake/Output Summary (Last 24 hours) at 08/27/16 0756 Last data filed at 08/26/16 2139  Gross per 24 hour  Intake             1514 ml  Output                0 ml  Net             1514 ml   Filed Weights   08/26/16 1144 08/26/16 1759  Weight: 60.8 kg (134 lb) 61.8 kg (136 lb 3.9 oz)    Examination:  General exam: Appears calm and comfortable  Respiratory system: Clear to auscultation. Respiratory effort normal---L breast engorged Cardiovascular system: S1 & S2 heard, RRR. No JVD, murmurs, rubs, gallops or clicks. No pedal edema. Gastrointestinal system: Abdomen is nondistended, soft and nontender.  Central nervous system: Alert and oriented. No  focal neurological deficits. Extremities: Symmetric 5 x 5 power. Skin: No rashes, lesions or ulcers Psychiatry: Judgement and insight appear normal although slighlty confused    Data Reviewed: I have personally reviewed following labs and imaging studies  CBC:  Recent Labs Lab 08/26/16 1247 08/27/16 0128  WBC 12.1* 8.3  NEUTROABS 9.9*  --   HGB 6.9* 8.7*  HCT 19.7* 24.7*  MCV 92.5 88.5  PLT 252 [id number]   Basic Metabolic Panel:  Recent Labs Lab 08/26/16 1247 08/27/16 0128  NA 129* 132*    K 4.0 3.8  CL 94* 99*  CO2 27 27  GLUCOSE 169* 108*  BUN 19 16  CREATININE 0.95 0.86  CALCIUM 8.8* 8.2*   GFR: Estimated Creatinine Clearance: 46.6 mL/min (by C-G formula based on SCr of 0.86 mg/dL). Liver Function Tests: No results for input(s): AST, ALT, ALKPHOS, BILITOT, PROT, ALBUMIN in the last 168 hours. No results for input(s): LIPASE, AMYLASE in the last 168 hours. No results for input(s): AMMONIA in the last 168 hours. Coagulation Profile:  Recent Labs Lab 08/26/16 1412 08/27/16 0128  INR 2.43 2.16   Cardiac Enzymes: No results for input(s): CKTOTAL, CKMB, CKMBINDEX, TROPONINI in the last 168 hours. BNP (last 3 results) No results for input(s): PROBNP in the last 8760 hours. HbA1C: No results for input(s): HGBA1C in the last 72 hours. CBG: No results for input(s): GLUCAP in the last 168 hours. Lipid Profile: No results for input(s): CHOL, HDL, LDLCALC, TRIG, CHOLHDL, LDLDIRECT in the last 72 hours. Thyroid Function Tests: No results for input(s): TSH, T4TOTAL, FREET4, T3FREE, THYROIDAB in the last 72 hours. Anemia Panel: No results for input(s): VITAMINB12, FOLATE, FERRITIN, TIBC, IRON, RETICCTPCT in the last 72 hours. Urine analysis: No results found for: COLORURINE, APPEARANCEUR, LABSPEC, PHURINE, GLUCOSEU, HGBUR, BILIRUBINUR, KETONESUR, PROTEINUR, UROBILINOGEN, NITRITE, LEUKOCYTESUR Sepsis Labs: @LABRCNTIP (procalcitonin:4,lacticidven:4)  )No results found for this or any previous visit (from the past 240 hour(s)).       Radiology Studies: Ct Chest W Contrast  Result Date: 08/26/2016 CLINICAL DATA:  Worsening swelling and hematoma left breast, recent left breast biopsy, decreased hemoglobin, question active bleeding on Coumadin EXAM: CT CHEST WITH CONTRAST TECHNIQUE: Multidetector CT imaging of the chest was performed during intravenous contrast administration. CONTRAST:  1 ISOVUE-300 IOPAMIDOL (ISOVUE-300) INJECTION 61% COMPARISON:  Chest x-ray  11/20/2015 FINDINGS: Cardiovascular: Cardiomegaly is noted. Mitral valve prosthesis. Single lead cardiac pacemaker in place with tip in right ventricle. No pericardial effusion. Atherosclerotic calcifications of thoracic aorta. The patient is status post aortic valve replacement. Mediastinum/Nodes: No mediastinal hematoma or adenopathy. No hilar adenopathy. Central airways are patent. Lungs/Pleura: There is bilateral small pleural effusion. Bilateral lower lobe posterior atelectasis. No pulmonary edema. No segmental infiltrates. The patient is status post median sternotomy. Mild calcified pleural thickening in left upper lobe posteriorly see axial image 31. Upper Abdomen: The visualized upper abdomen shows no adrenal gland mass. Visualized pancreas, spleen liver and upper kidneys are unremarkable. Musculoskeletal: The patient is status post recent left breast biopsy. There is a large hematoma with subacute blood products in left breast Measures 12.8 x 7.4 cm. On coronal image 22 the hematoma measures 11 cm cranial caudally by 10 cm transverse diameter. There is no evidence of active contrast extravasation to suggest active acute bleeding. There is mild superior displacement/ mass effect of cardiac pacemaker in left anterior chest wall. No acute rib fractures are noted. Sagittal images of the spine shows diffuse osteopenia. Multilevel compression deformities thoracic spine. Multilevel prior vertebroplasty mid and  lower thoracic spine. IMPRESSION: 1. The patient is status post recent left breast biopsy. There is a large hematoma with subacute blood products in left breast Measures 12.8 x 7.4 cm. On coronal image 22 the hematoma measures 11 cm cranial caudally by 10 cm transverse diameter. There is no evidence of active contrast extravasation to suggest active acute bleeding. There is mild superior displacement/ mass effect on cardiac pacemaker in left anterior chest wall. 2. Bilateral small pleural effusion. Bilateral  lower lobe posterior atelectasis. No pulmonary edema. No segmental infiltrates. 3. Cardiomegaly. Status post aortic and mitral valve replacement. Status post median sternotomy. 4. Multilevel compression deformities and prior vertebroplasty mid and lower thoracic spine. These results were called by telephone at the time of interpretation on 08/26/2016 at 2:29 pm to Dr. Jola Schmidt , who verbally acknowledged these results. Electronically Signed   By: Lahoma Crocker M.D.   On: 08/26/2016 14:30     Scheduled Meds: . amLODipine  5 mg Oral Q breakfast  . PARoxetine  40 mg Oral Q breakfast  . sodium chloride flush  3 mL Intravenous Q12H   Continuous Infusions:   LOS: 1 day    Time spent: [place], MD [facility] ([phone]   If 7PM-7AM, please contact night-coverage [email or web address] Password [facility] 08/27/2016, 7:56 AM

## 2016-08-28 LAB — CBC
HEMATOCRIT: 25.8 % — AB (ref 36.0–46.0)
HEMOGLOBIN: 8.8 g/dL — AB (ref 12.0–15.0)
MCH: 31 pg (ref 26.0–34.0)
MCHC: 34.1 g/dL (ref 30.0–36.0)
MCV: 90.8 fL (ref 78.0–100.0)
Platelets: 224 10*3/uL (ref 150–400)
RBC: 2.84 MIL/uL — AB (ref 3.87–5.11)
RDW: 15.2 % (ref 11.5–15.5)
WBC: 7.3 10*3/uL (ref 4.0–10.5)

## 2016-08-28 LAB — BASIC METABOLIC PANEL
Anion gap: 7 (ref 5–15)
BUN: 12 mg/dL (ref 6–20)
CHLORIDE: 100 mmol/L — AB (ref 101–111)
CO2: 26 mmol/L (ref 22–32)
Calcium: 8.5 mg/dL — ABNORMAL LOW (ref 8.9–10.3)
Creatinine, Ser: 0.79 mg/dL (ref 0.44–1.00)
GFR calc Af Amer: >60 mL/min (ref >60)
GFR calc non Af Amer: >60 mL/min (ref >60)
GLUCOSE: 121 mg/dL — AB (ref 65–99)
POTASSIUM: 3.8 mmol/L (ref 3.5–5.1)
Sodium: 133 mmol/L — ABNORMAL LOW (ref 135–145)

## 2016-08-28 LAB — HEPARIN LEVEL (UNFRACTIONATED): HEPARIN UNFRACTIONATED: 0.24 [IU]/mL — AB (ref 0.30–0.70)

## 2016-08-28 LAB — PROTIME-INR
INR: 1.96
Prothrombin Time: 22.6 seconds — ABNORMAL HIGH (ref 11.4–15.2)

## 2016-08-28 LAB — APTT: APTT: 53 s — AB (ref 24–36)

## 2016-08-28 MED ORDER — WARFARIN SODIUM 5 MG PO TABS
5.0000 mg | ORAL_TABLET | Freq: Once | ORAL | Status: AC
  Administered 2016-08-28: 5 mg via ORAL
  Filled 2016-08-28: qty 1

## 2016-08-28 MED ORDER — HEPARIN (PORCINE) IN NACL 100-0.45 UNIT/ML-% IJ SOLN
900.0000 [IU]/h | INTRAMUSCULAR | Status: DC
  Administered 2016-08-28: 900 [IU]/h via INTRAVENOUS
  Filled 2016-08-28: qty 250

## 2016-08-28 NOTE — Progress Notes (Signed)
PROGRESS NOTE    Courtney Evans  [id number] DOB: [birth year] DOA: 08/26/2016 PCP: Cari Caraway, MD  Outpatient Specialists:     Brief Narrative:   81 year old female Prior history of rheumatic Valvular heart disease status post mechanical aortic/mitral valve replacement in 94 Chronic atrial fibrillation [place] score >4 on Coumadin, Status post single chamber Medtronic single-chamber pacemaker 2013 Hypertension Diabetes mellitus Compensated diastolic heart failure Osteoporosis with vertebral compression fractures multiple keratoses Hyperlipidemia  Patient had a breast biopsy around 08/22/2016 progressed to have some swelling and induration and bleeding from the site of the biopsy. Patient went to see her primary physician found to have a hemoglobin of 9 INR of 3 and was told to hold Coumadin but came to the ED for recheck and was found to have a hemoglobin of 6    Assessment & Plan:   Active Problems:   Anemia   1 Anemia secondary to recent breast biopsy in setting of systemic anticoagulation-chest using 2 units PRBC--  Resume Coumadin 08/27/2016. --if below 2.5 3/4, Hep bridge 3/4.  CT negative for extravasation as well as active lead into breast--Hb stable 8 range repeat in am while resuming coumadin/hep bridging 2 Chest pain overnight 3/2 3 Possible infection of breast hematoma-Ceftin/Cefizox --> constipation and nausea-hold the same. Start ceftriaxone 1 g every 20. Monitor-As is allergic to codeine will place on scheduled Tylenol for pain- nonsteroidal in the setting given risk of bleed 4 Rheumatic valvular heart disease with mechanical valve 1994-difficult situation-discussed risk stroke and patient not anticoagulated but risk of bleed if continued use of Coumadin.  5 Hypertension continue amlodipine 5 mg-trends reasonable 6 Bipolar continue Elavil 10 daily at bedtime for sleep, Paxil 40 every morning 7 Diabetes mellitus type 2-holding metformin 500 twice a day-place on  regular diet for now. Seems well controlled baseline 100-170 8 Holding statin, vitamin D for now    DVT prophylaxis: coumadin resumed 3/3-see above Code Status: Full Family Communication: d/w daughters 3/4 --aware of possible d/c tomorrow pm Disposition Plan: 24-likely home if all stable   Consultants:   none  Procedures:   none  Antimicrobials:   none    Subjective:  Well pain and swelling L breast decreased no othe rissue Not needing oxygen right now  Objective: Vitals:   08/27/16 0821 08/27/16 1448 08/27/16 2135 08/28/16 0500  BP:  (!) 152/61 (!) 146/65 (!) 166/69  Pulse:  85 79 83  Resp:  18 18 19   Temp:  97.4 F (36.3 C) 98.8 F (37.1 C) 98 F (36.7 C)  TempSrc:  Oral Oral Oral  SpO2: 95% 94% 93% 95%  Weight:      Height:        Intake/Output Summary (Last 24 hours) at 08/28/16 1153 Last data filed at 08/28/16 0600  Gross per 24 hour  Intake              720 ml  Output                0 ml  Net              720 ml   Filed Weights   08/26/16 1144 08/26/16 1759  Weight: 60.8 kg (134 lb) 61.8 kg (136 lb 3.9 oz)    Examination:  General exam: Appears calm and comfortable  Respiratory system: Clear to auscultation. Respiratory effort normal---L breast engorged but decreased in size currently Cardiovascular system: S1 & S2 heard, RRR. No JVD, murmurs, rubs, gallops or clicks. No pedal edema. Gastrointestinal  system: Abdomen is nondistended, soft and nontender.  Central nervous system: Alert and oriented. No focal neurological deficits. Extremities: Symmetric 5 x 5 power. Skin: No rashes, lesions or ulcers Psychiatry: Judgement and insight appear normal although slighlty confused    Data Reviewed: I have personally reviewed following labs and imaging studies  CBC:  Recent Labs Lab 08/26/16 1247 08/27/16 0128 08/28/16 0542  WBC 12.1* 8.3 7.3  NEUTROABS 9.9*  --   --   HGB 6.9* 8.7* 8.8*  HCT 19.7* 24.7* 25.8*  MCV 92.5 88.5 90.8  PLT  252 187 [id number]   Basic Metabolic Panel:  Recent Labs Lab 08/26/16 1247 08/27/16 0128 08/28/16 0542  NA 129* 132* 133*  K 4.0 3.8 3.8  CL 94* 99* 100*  CO2 27 27 26   GLUCOSE 169* 108* 121*  BUN 19 16 12   CREATININE 0.95 0.86 0.79  CALCIUM 8.8* 8.2* 8.5*   GFR: Estimated Creatinine Clearance: 50 mL/min (by C-G formula based on SCr of 0.79 mg/dL). Liver Function Tests: No results for input(s): AST, ALT, ALKPHOS, BILITOT, PROT, ALBUMIN in the last 168 hours. No results for input(s): LIPASE, AMYLASE in the last 168 hours. No results for input(s): AMMONIA in the last 168 hours. Coagulation Profile:  Recent Labs Lab 08/26/16 1412 08/27/16 0128 08/28/16 0542  INR 2.43 2.16 1.96   Cardiac Enzymes: No results for input(s): CKTOTAL, CKMB, CKMBINDEX, TROPONINI in the last 168 hours. BNP (last 3 results) No results for input(s): PROBNP in the last 8760 hours. HbA1C: No results for input(s): HGBA1C in the last 72 hours. CBG: No results for input(s): GLUCAP in the last 168 hours. Lipid Profile: No results for input(s): CHOL, HDL, LDLCALC, TRIG, CHOLHDL, LDLDIRECT in the last 72 hours. Thyroid Function Tests: No results for input(s): TSH, T4TOTAL, FREET4, T3FREE, THYROIDAB in the last 72 hours. Anemia Panel: No results for input(s): VITAMINB12, FOLATE, FERRITIN, TIBC, IRON, RETICCTPCT in the last 72 hours. Urine analysis: No results found for: COLORURINE, APPEARANCEUR, LABSPEC, PHURINE, GLUCOSEU, HGBUR, BILIRUBINUR, KETONESUR, PROTEINUR, UROBILINOGEN, NITRITE, LEUKOCYTESUR Sepsis Labs: @LABRCNTIP (procalcitonin:4,lacticidven:4)  )No results found for this or any previous visit (from the past 240 hour(s)).       Radiology Studies: Ct Chest W Contrast  Result Date: 08/26/2016 CLINICAL DATA:  Worsening swelling and hematoma left breast, recent left breast biopsy, decreased hemoglobin, question active bleeding on Coumadin EXAM: CT CHEST WITH CONTRAST TECHNIQUE: Multidetector CT  imaging of the chest was performed during intravenous contrast administration. CONTRAST:  1 ISOVUE-300 IOPAMIDOL (ISOVUE-300) INJECTION 61% COMPARISON:  Chest x-ray 11/20/2015 FINDINGS: Cardiovascular: Cardiomegaly is noted. Mitral valve prosthesis. Single lead cardiac pacemaker in place with tip in right ventricle. No pericardial effusion. Atherosclerotic calcifications of thoracic aorta. The patient is status post aortic valve replacement. Mediastinum/Nodes: No mediastinal hematoma or adenopathy. No hilar adenopathy. Central airways are patent. Lungs/Pleura: There is bilateral small pleural effusion. Bilateral lower lobe posterior atelectasis. No pulmonary edema. No segmental infiltrates. The patient is status post median sternotomy. Mild calcified pleural thickening in left upper lobe posteriorly see axial image 31. Upper Abdomen: The visualized upper abdomen shows no adrenal gland mass. Visualized pancreas, spleen liver and upper kidneys are unremarkable. Musculoskeletal: The patient is status post recent left breast biopsy. There is a large hematoma with subacute blood products in left breast Measures 12.8 x 7.4 cm. On coronal image 22 the hematoma measures 11 cm cranial caudally by 10 cm transverse diameter. There is no evidence of active contrast extravasation to suggest active acute bleeding. There is  mild superior displacement/ mass effect of cardiac pacemaker in left anterior chest wall. No acute rib fractures are noted. Sagittal images of the spine shows diffuse osteopenia. Multilevel compression deformities thoracic spine. Multilevel prior vertebroplasty mid and lower thoracic spine. IMPRESSION: 1. The patient is status post recent left breast biopsy. There is a large hematoma with subacute blood products in left breast Measures 12.8 x 7.4 cm. On coronal image 22 the hematoma measures 11 cm cranial caudally by 10 cm transverse diameter. There is no evidence of active contrast extravasation to suggest  active acute bleeding. There is mild superior displacement/ mass effect on cardiac pacemaker in left anterior chest wall. 2. Bilateral small pleural effusion. Bilateral lower lobe posterior atelectasis. No pulmonary edema. No segmental infiltrates. 3. Cardiomegaly. Status post aortic and mitral valve replacement. Status post median sternotomy. 4. Multilevel compression deformities and prior vertebroplasty mid and lower thoracic spine. These results were called by telephone at the time of interpretation on 08/26/2016 at 2:29 pm to Dr. Jola Schmidt , who verbally acknowledged these results. Electronically Signed   By: Lahoma Crocker M.D.   On: 08/26/2016 14:30     Scheduled Meds: . amLODipine  5 mg Oral Q breakfast  . PARoxetine  40 mg Oral Q breakfast  . sodium chloride flush  3 mL Intravenous Q12H  . Warfarin - Pharmacist Dosing Inpatient   Does not apply q1800   Continuous Infusions:   LOS: 2 days    Time spent: [place], MD [facility] ([facility]   If 7PM-7AM, please contact night-coverage [email or web address] Password [email or web address] 08/28/2016, 11:53 AM

## 2016-08-28 NOTE — Progress Notes (Signed)
Courtney Evans for Warfarin, IV UFH Indication: MVR, AVR, Afib  Allergies  Allergen Reactions  . Codeine Nausea Only    Patient Measurements: Height: 5\' 4"  (162.6 cm) Weight: 136 lb 3.9 oz (61.8 kg) IBW/kg (Calculated) : 54.7  Heparin dosing weight: 62 kg  Vital Signs: Temp: 98 F (36.7 C) (03/04 0500) Temp Source: Oral (03/04 0500) BP: 166/69 (03/04 0500) Pulse Rate: 83 (03/04 0500)  Labs:  Recent Labs  08/26/16 1247 08/26/16 1412 08/27/16 0128 08/28/16 0542  HGB 6.9*  --  8.7* 8.8*  HCT 19.7*  --  24.7* 25.8*  PLT 252  --  187 224  APTT  --   --   --  53*  LABPROT  --  26.8* 24.4* 22.6*  INR  --  2.43 2.16 1.96  CREATININE 0.95  --  0.86 0.79    Estimated Creatinine Clearance: 50 mL/min (by C-G formula based on SCr of 0.79 mg/dL).  Medications:  Prescriptions Prior to Admission  Medication Sig Dispense Refill Last Dose  . acetaminophen (TYLENOL) 500 MG tablet Take 500-1,000 mg by mouth every 6 (six) hours as needed for mild pain, moderate pain, fever or headache.   08/25/2016 at Unknown time  . amitriptyline (ELAVIL) 10 MG tablet Take 10 mg by mouth at bedtime as needed for sleep. Reported on 11/19/2015   2 months  . amLODipine (NORVASC) 5 MG tablet Take 5 mg by mouth daily with breakfast.    08/26/2016 at Unknown time  . atorvastatin (LIPITOR) 20 MG tablet Take 20 mg by mouth daily with breakfast.    08/26/2016 at Unknown time  . cefUROXime (CEFTIN) 500 MG tablet Take 500 mg by mouth 2 (two) times daily. Started 02/28 for 10 days   08/26/2016 at am  . cholecalciferol (VITAMIN D) 1000 units tablet Take 2,000 Units by mouth daily with breakfast.   08/26/2016 at Unknown time  . metFORMIN (GLUCOPHAGE) 500 MG tablet Take 500 mg by mouth 2 (two) times daily with a meal.   08/26/2016 at am  . Multiple Vitamins-Minerals (ICAPS) CAPS Take 1 capsule by mouth daily with breakfast.   08/26/2016 at Unknown time  . nitroGLYCERIN (NITROSTAT) 0.4 MG SL tablet  Place 0.4 mg under the tongue every 5 (five) minutes as needed for chest pain.   unknown  . PARoxetine (PAXIL) 40 MG tablet Take 40 mg by mouth daily with breakfast.    08/26/2016 at Unknown time  . warfarin (COUMADIN) 3 MG tablet Take 1.5-3 mg by mouth every evening. Takes 3 mg everyday except 1.5mg  on Monday's   08/24/2016   Scheduled:  . amLODipine  5 mg Oral Q breakfast  . PARoxetine  40 mg Oral Q breakfast  . sodium chloride flush  3 mL Intravenous Q12H  . Warfarin - Pharmacist Dosing Inpatient   Does not apply q1800   Infusions:   Assessment: 65 yoF with Afib and Hx rheumatic heart disease with MVR and AVR on warfarin, HTN, DM, dCHF, admitted for symptomatic anemia. Patient recently had breast Bx in Feb and noted with bleeding from Bx site. Now with Hgb 6 on admission; pharmacy to resume warfarin for above indications.   Baseline INR SUBtherapeutic  Prior anticoagulation: warfarin 3 mg daily except 1.5 mg on Monday, LD 2/28  Significant events: 3/4 - with INR remaining low, adding heparin per Rx  Today, 08/28/2016:  CBC: Hgb low but stable today after 3 units PRBC 3/2; Plt stable wnl  INR SUBtherapeutic and  decreasing  Baseline aPTT elevated but not considered therapeutic  Major drug interactions: none  Per CT no active bleeding or extravasation into chest, but large hematoma present  Eating 25% of meals  Goal of Therapy: INR 2.5-3 - normally 2.5-3.5 but MD would like conservative range with bleeding Anti-Xa level 0.3-0.5 units/mL (given recent bleeding)  Plan:  Warfarin 5 mg PO today  Start heparin 900 units/hr without bolus  Check heparin level in 8 hrs  Daily INR, CBC  Monitor for signs of bleeding or thrombosis   Reuel Boom, PharmD Pager: [phone] 08/28/2016, 10:42 AM

## 2016-08-29 LAB — PROTIME-INR
INR: 1.8
PROTHROMBIN TIME: 21.2 s — AB (ref 11.4–15.2)

## 2016-08-29 LAB — CBC
HEMATOCRIT: 26.3 % — AB (ref 36.0–46.0)
HEMOGLOBIN: 8.9 g/dL — AB (ref 12.0–15.0)
MCH: 31.1 pg (ref 26.0–34.0)
MCHC: 33.8 g/dL (ref 30.0–36.0)
MCV: 92 fL (ref 78.0–100.0)
Platelets: 228 10*3/uL (ref 150–400)
RBC: 2.86 MIL/uL — AB (ref 3.87–5.11)
RDW: 14.9 % (ref 11.5–15.5)
WBC: 6.9 10*3/uL (ref 4.0–10.5)

## 2016-08-29 LAB — HEPARIN LEVEL (UNFRACTIONATED)
HEPARIN UNFRACTIONATED: 0.12 [IU]/mL — AB (ref 0.30–0.70)
HEPARIN UNFRACTIONATED: 0.55 [IU]/mL (ref 0.30–0.70)

## 2016-08-29 MED ORDER — HEPARIN (PORCINE) IN NACL 100-0.45 UNIT/ML-% IJ SOLN
1050.0000 [IU]/h | INTRAMUSCULAR | Status: DC
  Administered 2016-08-29: 1050 [IU]/h via INTRAVENOUS

## 2016-08-29 MED ORDER — WARFARIN SODIUM 4 MG PO TABS
4.0000 mg | ORAL_TABLET | Freq: Once | ORAL | Status: AC
  Administered 2016-08-29: 4 mg via ORAL
  Filled 2016-08-29: qty 1

## 2016-08-29 MED ORDER — HEPARIN (PORCINE) IN NACL 100-0.45 UNIT/ML-% IJ SOLN
1100.0000 [IU]/h | INTRAMUSCULAR | Status: DC
  Administered 2016-08-29 (×2): 1100 [IU]/h via INTRAVENOUS
  Filled 2016-08-29: qty 250

## 2016-08-29 MED ORDER — CLINDAMYCIN HCL 300 MG PO CAPS
300.0000 mg | ORAL_CAPSULE | Freq: Three times a day (TID) | ORAL | Status: DC
  Administered 2016-08-29 – 2016-08-30 (×3): 300 mg via ORAL
  Filled 2016-08-29 (×3): qty 1

## 2016-08-29 NOTE — Progress Notes (Signed)
PROGRESS NOTE    Courtney Evans  [id number] DOB: 01-06-1936 DOA: 08/26/2016 PCP: Cari Caraway, MD  Outpatient Specialists:     Brief Narrative:   81 year old female Prior history of rheumatic Valvular heart disease status post mechanical aortic/mitral valve replacement in 94 Chronic atrial fibrillation [place] score >4 on Coumadin, Status post single chamber Medtronic single-chamber pacemaker 2013 Hypertension Diabetes mellitus Compensated diastolic heart failure Osteoporosis with vertebral compression fractures multiple keratoses Hyperlipidemia  Patient had a breast biopsy around 08/22/2016 progressed to have some swelling and induration and bleeding from the site of the biopsy. Patient went to see her primary physician found to have a hemoglobin of 9 INR of 3 and was told to hold Coumadin but came to the ED for recheck and was found to have a hemoglobin of 6    Assessment & Plan:   Active Problems:   Anemia  1 Anemia secondary to recent breast biopsy in setting of systemic anticoagulation-chest using 2 units PRBC--  Resume Coumadin 08/27/2016. --if below 2.5 3/4, Hep bridge 3/4.  CT negative for extravasation as well as active bleed into breast--Hb stable 8 range-- resuming coumadin/hep bridging--INR dropped to 1.8.  Would keep inpatient today and continue slow bridge back to ~ 2.2 or 2.3 INR prior to d/c home hopefully 3/6 2 Chest pain overnight 3/2 3 Possible infection of breast hematoma-Ceftin/Cefizox --> constipation and nausea-hold the same. Start ceftriaxone 1 g every 20. Monitor-As is allergic to codeine will place on scheduled Tylenol for pain- nonsteroidal in the setting given risk of bleed--WBc down 12 on admit--->6.  Change to clindamycin po 3/5 4 Rheumatic valvular heart disease with mechanical valve 1994-difficult situation-discussed risk stroke and patient not anticoagulated but risk of bleed if continued use of Coumadin.  5 Hypertension continue amlodipine 5  mg-trends reasonable 6 Bipolar continue Elavil 10 daily at bedtime for sleep, Paxil 40 every morning 7 Diabetes mellitus type 2-holding metformin 500 twice a day-place on regular diet for now. Seems well controlled baseline 100-170 8 Holding statin, vitamin D for now    DVT prophylaxis: coumadin resumed 3/3-see above Code Status: Full Family Communication: d/w daughters 3/4 --aware of possible d/c tomorrow pm Disposition Plan: 24-likely home if all stable   Consultants:   none  Procedures:   none  Antimicrobials:   none    Subjective:  Better palced back on oxygen overngiht? No new issue Eating and drinking Pain still present-breast about the same  Objective: Vitals:   08/28/16 0500 08/28/16 1346 08/28/16 1900 08/29/16 0430  BP: (!) 166/69 (!) 148/57 (!) 119/49 (!) 117/55  Pulse: 83 74 78 94  Resp: 19 18 18 18   Temp: 98 F (36.7 C) 97.7 F (36.5 C) 98 F (36.7 C) 98.7 F (37.1 C)  TempSrc: Oral Oral Oral Oral  SpO2: 95% 92% 99% 94%  Weight:      Height:        Intake/Output Summary (Last 24 hours) at 08/29/16 1149 Last data filed at 08/29/16 0600  Gross per 24 hour  Intake            867.6 ml  Output                0 ml  Net            867.6 ml   Filed Weights   08/26/16 1144 08/26/16 1759  Weight: 60.8 kg (134 lb) 61.8 kg (136 lb 3.9 oz)    Examination:  General exam: Appears calm and comfortable  Respiratory system: Clear to auscultation. Respiratory effort normal---L breast engorged but decreased in size currently--stable size since last serial exam Cardiovascular system: S1 & S2 heard, RRR. HSM Gastrointestinal system: Abdomen is nondistended, soft and nontender.  Central nervous system: Alert and oriented. No focal neurological deficits. Extremities: Symmetric 5 x 5 power. Skin: No rashes, lesions or ulcers Psychiatry: Judgement and insight appear normal although slighlty confused    Data Reviewed: I have personally reviewed following  labs and imaging studies  CBC:  Recent Labs Lab 08/26/16 1247 08/27/16 0128 08/28/16 0542 08/29/16 0534  WBC 12.1* 8.3 7.3 6.9  NEUTROABS 9.9*  --   --   --   HGB 6.9* 8.7* 8.8* 8.9*  HCT 19.7* 24.7* 25.8* 26.3*  MCV 92.5 88.5 90.8 92.0  PLT 252 187 224 [id number]   Basic Metabolic Panel:  Recent Labs Lab 08/26/16 1247 08/27/16 0128 08/28/16 0542  NA 129* 132* 133*  K 4.0 3.8 3.8  CL 94* 99* 100*  CO2 27 27 26   GLUCOSE 169* 108* 121*  BUN 19 16 12   CREATININE 0.95 0.86 0.79  CALCIUM 8.8* 8.2* 8.5*   GFR: Estimated Creatinine Clearance: 50 mL/min (by C-G formula based on SCr of 0.79 mg/dL). Liver Function Tests: No results for input(s): AST, ALT, ALKPHOS, BILITOT, PROT, ALBUMIN in the last 168 hours. No results for input(s): LIPASE, AMYLASE in the last 168 hours. No results for input(s): AMMONIA in the last 168 hours. Coagulation Profile:  Recent Labs Lab 08/26/16 1412 08/27/16 0128 08/28/16 0542 08/29/16 0534  INR 2.43 2.16 1.96 1.80   Cardiac Enzymes: No results for input(s): CKTOTAL, CKMB, CKMBINDEX, TROPONINI in the last 168 hours. BNP (last 3 results) No results for input(s): PROBNP in the last 8760 hours. HbA1C: No results for input(s): HGBA1C in the last 72 hours. CBG: No results for input(s): GLUCAP in the last 168 hours. Lipid Profile: No results for input(s): CHOL, HDL, LDLCALC, TRIG, CHOLHDL, LDLDIRECT in the last 72 hours. Thyroid Function Tests: No results for input(s): TSH, T4TOTAL, FREET4, T3FREE, THYROIDAB in the last 72 hours. Anemia Panel: No results for input(s): VITAMINB12, FOLATE, FERRITIN, TIBC, IRON, RETICCTPCT in the last 72 hours. Urine analysis: No results found for: COLORURINE, APPEARANCEUR, LABSPEC, PHURINE, GLUCOSEU, HGBUR, BILIRUBINUR, KETONESUR, PROTEINUR, UROBILINOGEN, NITRITE, LEUKOCYTESUR Sepsis Labs: @LABRCNTIP (procalcitonin:4,lacticidven:4)  )No results found for this or any previous visit (from the past 240 hour(s)).        Radiology Studies: No results found.   Scheduled Meds: . amLODipine  5 mg Oral Q breakfast  . PARoxetine  40 mg Oral Q breakfast  . sodium chloride flush  3 mL Intravenous Q12H  . warfarin  4 mg Oral ONCE-1800  . Warfarin - Pharmacist Dosing Inpatient   Does not apply q1800   Continuous Infusions: . heparin 1,100 Units/hr (08/29/16 0831)     LOS: 3 days    Time spent: [place], MD [facility]   If 7PM-7AM, please contact night-coverage [email or web address] Password [email or web address] 08/29/2016, 11:49 AM

## 2016-08-29 NOTE — Progress Notes (Signed)
Brief Pharmacy Note: IV Heparin  See note by Doreene Eland, PharmD, for full details. In brief, this is a 32 y/oF on warfarin PTA for a-fib and hx of rheumatic heart disease with MVR and AVR. As INR subtherapeutic, MD also started heparin per pharmacy dosing. Given recent symptomatic anemia and bleeding from Bx site, targeting lower end of therapeutic range for heparin level (0.3-0.5 units/mL).   1628 heparin level = 0.55 units/mL on heparin infusion at 1100 units/mL  Hgb low but stable today after 3 units PRBCs on 3/2; Pltc WNL  No bleeding or IV site issues reported per nursing   Plan:  Decrease heparin infusion to 1050 units/hr.  Check heparin level 8 hours after rate change.  Daily CBC and heparin level while on heparin infusion.  Monitor closely for s/sx of bleeding.   Lindell Spar, PharmD, BCPS Pager: [phone] 08/29/2016 5:50 PM

## 2016-08-29 NOTE — Progress Notes (Signed)
[place] for Warfarin, IV UFH Indication: MVR, AVR, Afib  Allergies  Allergen Reactions  . Codeine Nausea Only    Patient Measurements: Height: 5\' 4"  (162.6 cm) Weight: 136 lb 3.9 oz (61.8 kg) IBW/kg (Calculated) : 54.7  Heparin dosing weight: 62 kg  Vital Signs: Temp: 98.7 F (37.1 C) (03/05 0430) Temp Source: Oral (03/05 0430) BP: 117/55 (03/05 0430) Pulse Rate: 94 (03/05 0430)  Labs:  Recent Labs  08/26/16 1247  08/27/16 0128 08/28/16 0542 08/28/16 1937 08/29/16 0534  HGB 6.9*  --  8.7* 8.8*  --  8.9*  HCT 19.7*  --  24.7* 25.8*  --  26.3*  PLT 252  --  187 224  --  228  APTT  --   --   --  53*  --   --   LABPROT  --   < > 24.4* 22.6*  --  21.2*  INR  --   < > 2.16 1.96  --  1.80  HEPARINUNFRC  --   --   --   --  0.24* 0.12*  CREATININE 0.95  --  0.86 0.79  --   --   < > = values in this interval not displayed.  Estimated Creatinine Clearance: 50 mL/min (by C-G formula based on SCr of 0.79 mg/dL).  Medications:  Scheduled:  . amLODipine  5 mg Oral Q breakfast  . PARoxetine  40 mg Oral Q breakfast  . sodium chloride flush  3 mL Intravenous Q12H  . Warfarin - Pharmacist Dosing Inpatient   Does not apply q1800   Infusions:  . heparin 900 Units/hr (08/28/16 1336)   Assessment: 33 yoF with Afib and Hx rheumatic heart disease with MVR and AVR on warfarin, HTN, DM, dCHF, admitted for symptomatic anemia. Patient recently had breast Bx in Feb and noted with bleeding from Bx site. Now with Hgb 6 on admission; pharmacy to resume warfarin for above indications.   Baseline INR SUBtherapeutic  Prior anticoagulation: warfarin 3 mg daily except 1.5 mg on Monday, LD 2/28  Significant events: 3/4 - with INR remaining low, adding heparin per Rx  Today, 08/29/2016:  Heparin level low last night (appears level not checked out to following shift).  Heparin level check this morning reveals subtherapeutic heparin level.    INR  subtherapeutic and trending down   CBC: Hgb low but stable today after 3 units PRBC 3/2; Plt stable wnl  Baseline aPTT elevated but not considered therapeutic  Major drug interactions: none  Per CT no active bleeding or extravasation into chest, but large hematoma present  Eating   Goal of Therapy: INR 2.5-3 - normally 2.5-3.5 but MD would like conservative range with bleeding Anti-Xa level 0.3-0.5 units/mL (given recent bleeding)  Plan:  Warfarin 4 mg PO today for INR trending down (normal dose is 1.5mg  on Monday)  Increase heparin to 1100 units/hr  Check heparin level in 8 hrs  Daily INR, CBC  Monitor for signs of bleeding or thrombosis   Doreene Eland, PharmD, BCPS.   Pager: [phone] 08/29/2016 7:41 AM

## 2016-08-30 LAB — CBC
HCT: 24.9 % — ABNORMAL LOW (ref 36.0–46.0)
Hemoglobin: 8.4 g/dL — ABNORMAL LOW (ref 12.0–15.0)
MCH: 30.9 pg (ref 26.0–34.0)
MCHC: 33.7 g/dL (ref 30.0–36.0)
MCV: 91.5 fL (ref 78.0–100.0)
PLATELETS: 239 10*3/uL (ref 150–400)
RBC: 2.72 MIL/uL — ABNORMAL LOW (ref 3.87–5.11)
RDW: 14.9 % (ref 11.5–15.5)
WBC: 8.1 10*3/uL (ref 4.0–10.5)

## 2016-08-30 LAB — PROTIME-INR
INR: 2.47
PROTHROMBIN TIME: 27.2 s — AB (ref 11.4–15.2)

## 2016-08-30 LAB — HEPARIN LEVEL (UNFRACTIONATED)
HEPARIN UNFRACTIONATED: 0.5 [IU]/mL (ref 0.30–0.70)
HEPARIN UNFRACTIONATED: 0.19 [IU]/mL — AB (ref 0.30–0.70)

## 2016-08-30 MED ORDER — WARFARIN SODIUM 3 MG PO TABS: 3.0000 mg | ORAL_TABLET | Freq: Once | ORAL | Status: DC

## 2016-08-30 MED ORDER — CLINDAMYCIN HCL 300 MG PO CAPS: 300.0000 mg | ORAL_CAPSULE | Freq: Three times a day (TID) | ORAL | 0 refills | Status: DC

## 2016-08-30 NOTE — Progress Notes (Signed)
Pharmacy - brief note (heparin level follow-up)  3/6 10am - Heparin level = 0.19 (heparin off at time of lab draw)  Plan:  Heparin turned off at 0838 as patient being discharged.  Heparin level could have been d/c'd but did not know timing of possible discharge.   Doreene Eland, PharmD, BCPS.   Pager: [phone] 08/30/2016 10:26 AM

## 2016-08-30 NOTE — Discharge Summary (Signed)
Physician Discharge Summary  Courtney Evans FAO:[id number] DOB: Dec 21, 1935 DOA: 08/26/2016  PCP: Courtney Caraway, MD  Admit date: 08/26/2016 Discharge date: 08/30/2016  Time spent: 30 minutes  Recommendations for Outpatient Follow-up:  1. Check INR/CBC this Friday at appt 2. Complete Clindamycin in 4 days.    3. Needs check of breat at pcp office  Discharge Diagnoses:  Active Problems:   Anemia   Discharge Condition: fair  Diet recommendation: hh low salt  Filed Weights   08/26/16 1144 08/26/16 1759  Weight: 60.8 kg (134 lb) 61.8 kg (136 lb 3.9 oz)    History of present illness:  81 year old female Prior history of rheumatic Valvular heart disease status post mechanical aortic/mitral valve replacement in 94 Chronic atrial fibrillation [place] score >4 on Coumadin, Status post single chamber Medtronic single-chamber pacemaker 2013 Hypertension Diabetes mellitus Compensated diastolic heart failure Osteoporosis with vertebral compression fractures multiple keratoses Hyperlipidemia  Patient had a breast biopsy around 08/22/2016 progressed to have some swelling and induration and bleeding from the site of the biopsy. Patient went to see her primary physician found to have a hemoglobin of 9 INR of 3 and was told to hold Coumadin but came to the ED for recheck and was found to have a hemoglobin of 6   Hospital Course:  Active Problems:   Anemia  1          Anemia secondary to recent breast biopsy in setting of systemic anticoagulation-chest using 2 units PRBC--  Resume Coumadin 08/27/2016-INR 2.4 and can d/c 2          Chest pain overnight 3/2 3          Possible infection of breast hematoma-Ceftin/Cefizox --> constipation and nausea-hold the same. Start ceftriaxone 1 g every 20. transitioned to po clinda till 3/10--WBc down 12 on admit--->6.   4          Rheumatic valvular heart disease with mechanical valve 1994-difficult situation-discussed risk stroke and patient not anticoagulated  but risk of bleed if continued use of Coumadin.  5          Hypertension continue amlodipine 5 mg-trends reasonable 6          Bipolar continue Elavil 10 daily at bedtime for sleep, Paxil 40 every morning 7          Diabetes mellitus type 2-holding metformin 500 twice a day-place on regular diet for now. Seems well controlled baseline 100-170  8          Holding statin, vitamin D for now--resumed on d/c home   Procedures:  none   Consultations:  none  Discharge Exam: Vitals:   08/29/16 2122 08/30/16 0547  BP: (!) 136/54 (!) 130/59  Pulse: 81 62  Resp: 20 18  Temp: 98.5 F (36.9 C) 98 F (36.7 C)    General: eomim ncat Cardiovascular: s1 s2 no m/r/g Respiratory: clear no added sound  Discharge Instructions    Current Discharge Medication List    START taking these medications   Details  clindamycin (CLEOCIN) 300 MG capsule Take 1 capsule (300 mg total) by mouth every 8 (eight) hours. Qty: 12 capsule, Refills: 0      CONTINUE these medications which have NOT CHANGED   Details  acetaminophen (TYLENOL) 500 MG tablet Take 500-1,000 mg by mouth every 6 (six) hours as needed for mild pain, moderate pain, fever or headache.    amitriptyline (ELAVIL) 10 MG tablet Take 10 mg by mouth at bedtime as needed for sleep. Reported  on 11/19/2015    amLODipine (NORVASC) 5 MG tablet Take 5 mg by mouth daily with breakfast.     atorvastatin (LIPITOR) 20 MG tablet Take 20 mg by mouth daily with breakfast.     cholecalciferol (VITAMIN D) 1000 units tablet Take 2,000 Units by mouth daily with breakfast.    metFORMIN (GLUCOPHAGE) 500 MG tablet Take 500 mg by mouth 2 (two) times daily with a meal.    Multiple Vitamins-Minerals (ICAPS) CAPS Take 1 capsule by mouth daily with breakfast.    nitroGLYCERIN (NITROSTAT) 0.4 MG SL tablet Place 0.4 mg under the tongue every 5 (five) minutes as needed for chest pain.    PARoxetine (PAXIL) 40 MG tablet Take 40 mg by mouth daily with breakfast.      warfarin (COUMADIN) 3 MG tablet Take 1.5-3 mg by mouth every evening. Takes 3 mg everyday except 1.47m on Monday's      STOP taking these medications     cefUROXime (CEFTIN) 500 MG tablet        Allergies  Allergen Reactions  . Codeine Nausea Only   Follow-up Information    Evans,WENDY, MD.   Specialty:  Family Medicine Contact information: [id number] [phone]           The results of significant diagnostics from this hospitalization (including imaging, microbiology, ancillary and laboratory) are listed below for reference.    Significant Diagnostic Studies: Ct Chest W Contrast  Result Date: 08/26/2016 CLINICAL DATA:  Worsening swelling and hematoma left breast, recent left breast biopsy, decreased hemoglobin, question active bleeding on Coumadin EXAM: CT CHEST WITH CONTRAST TECHNIQUE: Multidetector CT imaging of the chest was performed during intravenous contrast administration. CONTRAST:  1 ISOVUE-300 IOPAMIDOL (ISOVUE-300) INJECTION 61% COMPARISON:  Chest x-ray 11/20/2015 FINDINGS: Cardiovascular: Cardiomegaly is noted. Mitral valve prosthesis. Single lead cardiac pacemaker in place with tip in right ventricle. No pericardial effusion. Atherosclerotic calcifications of thoracic aorta. The patient is status post aortic valve replacement. Mediastinum/Nodes: No mediastinal hematoma or adenopathy. No hilar adenopathy. Central airways are patent. Lungs/Pleura: There is bilateral small pleural effusion. Bilateral lower lobe posterior atelectasis. No pulmonary edema. No segmental infiltrates. The patient is status post median sternotomy. Mild calcified pleural thickening in left upper lobe posteriorly see axial image 31. Upper Abdomen: The visualized upper abdomen shows no adrenal gland mass. Visualized pancreas, spleen liver and upper kidneys are unremarkable. Musculoskeletal: The patient is status post recent left breast biopsy. There is a  large hematoma with subacute blood products in left breast Measures 12.8 x 7.4 cm. On coronal image 22 the hematoma measures 11 cm cranial caudally by 10 cm transverse diameter. There is no evidence of active contrast extravasation to suggest active acute bleeding. There is mild superior displacement/ mass effect of cardiac pacemaker in left anterior chest wall. No acute rib fractures are noted. Sagittal images of the spine shows diffuse osteopenia. Multilevel compression deformities thoracic spine. Multilevel prior vertebroplasty mid and lower thoracic spine. IMPRESSION: 1. The patient is status post recent left breast biopsy. There is a large hematoma with subacute blood products in left breast Measures 12.8 x 7.4 cm. On coronal image 22 the hematoma measures 11 cm cranial caudally by 10 cm transverse diameter. There is no evidence of active contrast extravasation to suggest active acute bleeding. There is mild superior displacement/ mass effect on cardiac pacemaker in left anterior chest wall. 2. Bilateral small pleural effusion. Bilateral lower lobe posterior atelectasis. No pulmonary edema. No segmental infiltrates.  3. Cardiomegaly. Status post aortic and mitral valve replacement. Status post median sternotomy. 4. Multilevel compression deformities and prior vertebroplasty mid and lower thoracic spine. These results were called by telephone at the time of interpretation on 08/26/2016 at 2:29 pm to Dr. Jola Schmidt , who verbally acknowledged these results. Electronically Signed   By: Lahoma Crocker M.D.   On: 08/26/2016 14:30   US Breast Ltd Uni Left Inc Axilla  Addendum Date: 08/17/2016   ADDENDUM REPORT: 08/17/2016 12:26 ADDENDUM: Today I spoke by telephone with Dr. Cari Evans, the patient's primary provider. The patient is currently anticoagulated with Coumadin due to a history of stroke, atrial fibrillation and mechanical heart valves. Therefore, she will need to remain on the Coumadin for the biopsy. The  patient is scheduled for the biopsy on Monday, February 27 at 2:45 p.m. and I will be at the [facility] that day to perform the biopsy. Electronically Signed   By: Evangeline Dakin M.D.   On: 08/17/2016 12:26   Result Date: 08/17/2016 CLINICAL DATA:  Recall from screening mammography with tomosynthesis, possible mass in the upper left breast visualized only on the MLO view. EXAM: 2D DIGITAL DIAGNOSTIC LEFT MAMMOGRAM WITH CAD AND ADJUNCT TOMO ULTRASOUND LEFT BREAST COMPARISON:  Screening mammogram with tomosynthesis 07/15/2016. Prior film screen mammograms and xeromammograms from [facility] in [place], [place] from 2007 dating back to 1986 are also available for comparison. ACR Breast Density Category b: There are scattered areas of fibroglandular density. FINDINGS: Standard and tomosynthesis spot-compression MLO view of the area of concern in the upper left breast and standard 2D and tomosynthesis full field laterally exaggerated CC and mediolateral views of the left breast were obtained. The laterally exaggerated CC tomosynthesis images demonstrate an approximate 4 mm low-density mass with a solitary calcification which is felt to correlate with screening mammographic findings. There is no associated architectural distortion or suspicious calcification. Mammographic images were processed with CAD. On physical exam, there is no palpable abnormality in the upper left breast. The battery generator pack for the indwelling pacemaker is visible in the skin of the upper left breast. Targeted left breast ultrasound is performed, showing a hypoechoic antiparallel mass with slight acoustic shadowing and no internal power Doppler flow at the 11 o'clock position approximately 5 cm from the nipple measuring approximately 5 x 3 x 3 mm, likely corresponding to the area of concern on screening mammography. Sonographic evaluation of the left axilla demonstrates no pathologic  lymphadenopathy. IMPRESSION: Indeterminate 5 mm mass in the upper inner left breast likely accounting for the area of concern on screening mammography. No pathologic left axillary lymphadenopathy. RECOMMENDATION: Ultrasound-guided core needle biopsy of the indeterminate left breast mass. The core needle biopsy procedure was discussed with the patient and her daughter. All of their questions were answered. The patient has agreed to proceed and this has been scheduled for Monday, February 12 at 8:30 a.m. I have discussed the findings and recommendations with the patient. Results were also provided in writing at the conclusion of the visit. BI-RADS CATEGORY  4: Suspicious. Electronically Signed: By: Evangeline Dakin M.D. On: 08/05/2016 15:08   Mm Diag Breast Tomo Uni Left  Addendum Date: 08/17/2016   ADDENDUM REPORT: 08/17/2016 12:26 ADDENDUM: Today I spoke by telephone with Dr. Cari Evans, the patient's primary provider. The patient is currently anticoagulated with Coumadin due to a history of stroke, atrial fibrillation and mechanical heart valves. Therefore, she will need to remain on the Coumadin  for the biopsy. The patient is scheduled for the biopsy on Monday, February 27 at 2:45 p.m. and I will be at the [facility] that day to perform the biopsy. Electronically Signed   By: Evangeline Dakin M.D.   On: 08/17/2016 12:26   Result Date: 08/17/2016 CLINICAL DATA:  Recall from screening mammography with tomosynthesis, possible mass in the upper left breast visualized only on the MLO view. EXAM: 2D DIGITAL DIAGNOSTIC LEFT MAMMOGRAM WITH CAD AND ADJUNCT TOMO ULTRASOUND LEFT BREAST COMPARISON:  Screening mammogram with tomosynthesis 07/15/2016. Prior film screen mammograms and xeromammograms from [facility] in [place], [place] from 2007 dating back to 1986 are also available for comparison. ACR Breast Density Category b: There are scattered areas of  fibroglandular density. FINDINGS: Standard and tomosynthesis spot-compression MLO view of the area of concern in the upper left breast and standard 2D and tomosynthesis full field laterally exaggerated CC and mediolateral views of the left breast were obtained. The laterally exaggerated CC tomosynthesis images demonstrate an approximate 4 mm low-density mass with a solitary calcification which is felt to correlate with screening mammographic findings. There is no associated architectural distortion or suspicious calcification. Mammographic images were processed with CAD. On physical exam, there is no palpable abnormality in the upper left breast. The battery generator pack for the indwelling pacemaker is visible in the skin of the upper left breast. Targeted left breast ultrasound is performed, showing a hypoechoic antiparallel mass with slight acoustic shadowing and no internal power Doppler flow at the 11 o'clock position approximately 5 cm from the nipple measuring approximately 5 x 3 x 3 mm, likely corresponding to the area of concern on screening mammography. Sonographic evaluation of the left axilla demonstrates no pathologic lymphadenopathy. IMPRESSION: Indeterminate 5 mm mass in the upper inner left breast likely accounting for the area of concern on screening mammography. No pathologic left axillary lymphadenopathy. RECOMMENDATION: Ultrasound-guided core needle biopsy of the indeterminate left breast mass. The core needle biopsy procedure was discussed with the patient and her daughter. All of their questions were answered. The patient has agreed to proceed and this has been scheduled for Monday, February 12 at 8:30 a.m. I have discussed the findings and recommendations with the patient. Results were also provided in writing at the conclusion of the visit. BI-RADS CATEGORY  4: Suspicious. Electronically Signed: By: Evangeline Dakin M.D. On: 08/05/2016 15:08   Mm Clip Placement Left  Result Date:  08/19/2016 CLINICAL DATA:  Confirmation clip placement after ultrasound-guided core needle biopsy of a 5 mm mass in the upper inner left breast adjacent to the battery generator pack for the indwelling pacemaker. EXAM: DIAGNOSTIC LEFT MAMMOGRAM POST ULTRASOUND BIOPSY COMPARISON:  Previous exam(s). FINDINGS: Mammographic images were obtained following ultrasound guided biopsy of an indeterminate 5 mm mass in the upper inner left breast. The ribbon shaped tissue marker clip is appropriately positioned at the site of the biopsied mass in the upper inner breast adjacent to the battery generator pack for the indwelling pacemaker. Expected post biopsy changes are present without evidence of hematoma. IMPRESSION: Appropriate positioning of the ribbon shaped tissue marker clip at the site of the biopsied mass in the upper inner left breast adjacent to the battery generator pack for the indwelling pacemaker. Final Assessment: Post Procedure Mammograms for Marker Placement Electronically Signed   By: Evangeline Dakin M.D.   On: 08/19/2016 14:19   [place] Lt Breast Bx W Loc Dev 1st Lesion Img Bx Spec US Guide  Addendum Date: 08/22/2016   ADDENDUM REPORT: 08/22/2016 10:40 ADDENDUM: Pathology revealed benign adipose tissue with vessels in the left breast. This was found to be concordant by Dr. Peggye Fothergill. Pathology results were discussed with the patient's daughter, Bonney Roussel, at the request of the patient, by telephone. The patient did well after the biopsy with bruising at the site. Post biopsy instructions and care were reviewed and questions were answered. The patient was encouraged to call [facility] for any additional concerns. The patient was instructed to return for left diagnostic mammography and possible ultrasound in 6 months and informed that a reminder letter would be sent regarding this appointment. Pathology results reported by Susa Raring RN, BSN on 08/22/2016. Electronically  Signed   By: Evangeline Dakin M.D.   On: 08/22/2016 10:40   Result Date: 08/22/2016 CLINICAL DATA:  81 year old with a screening detected indeterminate 5 mm mass in the upper outer quadrant of the left breast at the 11 o'clock position approximately 5 cm from the nipple. Of note, the patient has an indwelling pacemaker, and the battery generator pack is present adjacent to the mass. The patient is anticoagulated on Coumadin for mechanical mitral and aortic valves, and therefore the anticoagulation was not reversed prior to the procedure. I discussed this by telephone with Dr. Addison Lank, the patient's primary physician, and we decided to obtain a single sample in order to minimize bleeding risk. EXAM: ULTRASOUND GUIDED LEFT BREAST CORE NEEDLE BIOPSY COMPARISON:  Previous exam(s). FINDINGS: I met with the patient and we discussed the procedure of ultrasound-guided biopsy, including benefits and alternatives. We discussed the high likelihood of a successful procedure. We discussed the risks of the procedure, including infection, bleeding, tissue injury, clip migration, and inadequate sampling. Informed written consent was given. The usual time-out protocol was performed immediately prior to the procedure. Using sterile technique with chlorhexidine as skin antisepsis, 1% lidocaine and 1% lidocaine with epinephrine as local anesthetic, under direct ultrasound visualization, a 12 gauge Celero vacuum assisted device was used to perform biopsy of the mass in the upper inner left breast using a lateral approach. Because of the anticoagulation history, a single sample was obtained. At the conclusion of the procedure a ribbon shaped tissue marker clip was deployed into the biopsy cavity. Follow up 2 view mammogram was performed and dictated separately. IMPRESSION: Ultrasound guided biopsy of an indeterminate 5 mm mass in the upper inner quadrant of the left breast. No apparent complications. Electronically Signed: By: Evangeline Dakin M.D. On: 08/19/2016 14:15    Microbiology: No results found for this or any previous visit (from the past 240 hour(s)).   Labs: Basic Metabolic Panel:  Recent Labs Lab 08/26/16 1247 08/27/16 0128 08/28/16 0542  NA 129* 132* 133*  K 4.0 3.8 3.8  CL 94* 99* 100*  CO2 _0 GLUCOSE 169* 108* 121*  BUN _1 CREATININE 0.95 0.86 0.79  CALCIUM 8.8* 8.2* 8.5*   Liver Function Tests: No results for input(s): AST, ALT, ALKPHOS, BILITOT, PROT, ALBUMIN in the last 168 hours. No results for input(s): LIPASE, AMYLASE in the last 168 hours. No results for input(s): AMMONIA in the last 168 hours. CBC:  Recent Labs Lab 08/26/16 1247 08/27/16 0128 08/28/16 0542 08/29/16 0534 08/30/16 0222  WBC 12.1* 8.3 7.3 6.9 8.1  NEUTROABS 9.9*  --   --   --   --   HGB 6.9* 8.7* 8.8* 8.9* 8.4*  HCT 19.7* 24.7* 25.8*  26.3* 24.9*  MCV 92.5 88.5 90.8 92.0 91.5  PLT 252 187 224 228 239   Cardiac Enzymes: No results for input(s): CKTOTAL, CKMB, CKMBINDEX, TROPONINI in the last 168 hours. BNP: BNP (last 3 results) No results for input(s): BNP in the last 8760 hours.  ProBNP (last 3 results) No results for input(s): PROBNP in the last 8760 hours.  CBG: No results for input(s): GLUCAP in the last 168 hours.     SignedNita Sells MD   [facility] 08/30/2016, 7:43 AM

## 2016-08-30 NOTE — Progress Notes (Signed)
Brief Pharmacy Note: IV Heparin  See note by Doreene Eland, PharmD 3/5, for full details. In brief, this is a 83 y/oF on warfarin PTA for a-fib and hx of rheumatic heart disease with MVR and AVR. As INR subtherapeutic, MD also started heparin per pharmacy dosing. Given recent symptomatic anemia and bleeding from Bx site, targeting lower end of therapeutic range for heparin level (0.3-0.5 units/mL).    0222 heparin level = 0.50 units/mL on heparin infusion at 1050 units/mL  Hgb low but stable today after 3 units PRBCs on 3/2; Pltc WNL  No bleeding or IV site issues reported per nursing   Plan:  Continue heparin infusion at 1050 units/hr.  Recheck heparin level today to ensure stays in therapeutic range  Daily CBC and heparin level while on heparin infusion.  Monitor closely for s/sx of bleeding.    Dorrene German 08/29/2016 5:50 PM

## 2016-08-30 NOTE — Progress Notes (Signed)
[place] for Warfarin, IV UFH Indication: MVR, AVR, Afib  Allergies  Allergen Reactions  . Codeine Nausea Only    Patient Measurements: Height: 5\' 4"  (162.6 cm) Weight: 136 lb 3.9 oz (61.8 kg) IBW/kg (Calculated) : 54.7  Heparin dosing weight: 62 kg  Vital Signs: Temp: 98 F (36.7 C) (03/06 0547) Temp Source: Oral (03/06 0547) BP: 130/59 (03/06 0547) Pulse Rate: 62 (03/06 0547)  Labs:  Recent Labs  08/28/16 0542  08/29/16 0534 08/29/16 1628 08/30/16 0222  HGB 8.8*  --  8.9*  --  8.4*  HCT 25.8*  --  26.3*  --  24.9*  PLT 224  --  228  --  239  APTT 53*  --   --   --   --   LABPROT 22.6*  --  21.2*  --  27.2*  INR 1.96  --  1.80  --  2.47  HEPARINUNFRC  --   < > 0.12* 0.55 0.50  CREATININE 0.79  --   --   --   --   < > = values in this interval not displayed.  Estimated Creatinine Clearance: 50 mL/min (by C-G formula based on SCr of 0.79 mg/dL).  Medications:  Scheduled:  . amLODipine  5 mg Oral Q breakfast  . clindamycin  300 mg Oral Q8H  . PARoxetine  40 mg Oral Q breakfast  . sodium chloride flush  3 mL Intravenous Q12H  . Warfarin - Pharmacist Dosing Inpatient   Does not apply q1800   Infusions:  . heparin 1,050 Units/hr (08/29/16 1809)   Assessment: [name] with Afib and Hx rheumatic heart disease with MVR and AVR on warfarin, HTN, DM, dCHF, admitted for symptomatic anemia. Patient recently had breast Bx in Feb and noted with bleeding from Bx site. Now with Hgb 6 on admission; pharmacy to resume warfarin for above indications.   Baseline INR SUBtherapeutic  Prior anticoagulation: warfarin 3 mg daily except 1.5 mg on Monday, LD 2/28  Significant events: 3/4 - with INR remaining low, adding heparin per Rx  Today, 08/30/2016:  Heparin level therapeutic early this morning on 1050 units/hr, confirmatory level ordered for later this morning  INR has trended up to 2.47 this am with higher doses  CBC: Hgb low/mild  decrease overnight (s/p 3 units PRBC 3/2); Plt stable wnl  Baseline aPTT elevated but not considered therapeutic  Major drug interactions: none  Per CT no active bleeding or extravasation into chest, but large hematoma present  Goal of Therapy: INR 2.5-3 - normally 2.5-3.5 but MD would like conservative range with bleeding Anti-Xa level 0.3-0.5 units/mL (given recent bleeding)  Plan:  Appears appropriate to resume home warfarin dose  At discharge, may require lower dose if wish to remain at lower end goal (ie INR 2.5-3).  For example, adjust regimen to be 3mg  daily with 1.5mg  on Mon and Wed  Continue heparin 1050 units/hr and f/u recheck heparin level if heparin not stopped [facility] notes anticipate discharge if INR was > 2.2 or 2.3).  Daily INR, CBC, heparin level   Monitor for signs of bleeding or thrombosis  Doreene Eland, PharmD, BCPS.   Pager: [phone] 08/30/2016 7:06 AM

## 2016-09-02 DIAGNOSIS — D62 Acute posthemorrhagic anemia: Secondary | ICD-10-CM | POA: Diagnosis not present

## 2016-09-02 DIAGNOSIS — N6489 Other specified disorders of breast: Secondary | ICD-10-CM | POA: Diagnosis not present

## 2016-09-02 DIAGNOSIS — I480 Paroxysmal atrial fibrillation: Secondary | ICD-10-CM | POA: Diagnosis not present

## 2016-09-02 DIAGNOSIS — Z7901 Long term (current) use of anticoagulants: Secondary | ICD-10-CM | POA: Diagnosis not present

## 2016-09-16 DIAGNOSIS — Z7901 Long term (current) use of anticoagulants: Secondary | ICD-10-CM | POA: Diagnosis not present

## 2016-09-16 DIAGNOSIS — I480 Paroxysmal atrial fibrillation: Secondary | ICD-10-CM | POA: Diagnosis not present

## 2016-09-16 DIAGNOSIS — M546 Pain in thoracic spine: Secondary | ICD-10-CM | POA: Diagnosis not present

## 2016-09-17 ENCOUNTER — Encounter (HOSPITAL_COMMUNITY): Payer: Self-pay | Admitting: Emergency Medicine

## 2016-09-17 ENCOUNTER — Emergency Department (HOSPITAL_COMMUNITY)
Admission: EM | Admit: 2016-09-17 | Discharge: 2016-09-17 | Disposition: A | Discharge status: Home / Self Care | Payer: Medicare Other | Attending: Emergency Medicine | Admitting: Emergency Medicine

## 2016-09-17 ENCOUNTER — Emergency Department (HOSPITAL_COMMUNITY): Payer: Medicare Other

## 2016-09-17 DIAGNOSIS — I2510 Atherosclerotic heart disease of native coronary artery without angina pectoris: Secondary | ICD-10-CM | POA: Diagnosis not present

## 2016-09-17 DIAGNOSIS — M546 Pain in thoracic spine: Secondary | ICD-10-CM

## 2016-09-17 DIAGNOSIS — Y939 Activity, unspecified: Secondary | ICD-10-CM | POA: Diagnosis not present

## 2016-09-17 DIAGNOSIS — S22070A Wedge compression fracture of T9-T10 vertebra, initial encounter for closed fracture: Secondary | ICD-10-CM | POA: Diagnosis not present

## 2016-09-17 DIAGNOSIS — X500XXA Overexertion from strenuous movement or load, initial encounter: Secondary | ICD-10-CM | POA: Diagnosis not present

## 2016-09-17 DIAGNOSIS — Z7984 Long term (current) use of oral hypoglycemic drugs: Secondary | ICD-10-CM | POA: Diagnosis not present

## 2016-09-17 DIAGNOSIS — S24103A Unspecified injury at T7-T10 level of thoracic spinal cord, initial encounter: Secondary | ICD-10-CM | POA: Diagnosis present

## 2016-09-17 DIAGNOSIS — Y999 Unspecified external cause status: Secondary | ICD-10-CM | POA: Diagnosis not present

## 2016-09-17 DIAGNOSIS — E119 Type 2 diabetes mellitus without complications: Secondary | ICD-10-CM | POA: Insufficient documentation

## 2016-09-17 DIAGNOSIS — Z950 Presence of cardiac pacemaker: Secondary | ICD-10-CM | POA: Insufficient documentation

## 2016-09-17 DIAGNOSIS — Z7901 Long term (current) use of anticoagulants: Secondary | ICD-10-CM | POA: Insufficient documentation

## 2016-09-17 DIAGNOSIS — Y929 Unspecified place or not applicable: Secondary | ICD-10-CM | POA: Diagnosis not present

## 2016-09-17 DIAGNOSIS — Z79899 Other long term (current) drug therapy: Secondary | ICD-10-CM | POA: Diagnosis not present

## 2016-09-17 DIAGNOSIS — I10 Essential (primary) hypertension: Secondary | ICD-10-CM | POA: Insufficient documentation

## 2016-09-17 DIAGNOSIS — R0781 Pleurodynia: Secondary | ICD-10-CM | POA: Diagnosis not present

## 2016-09-17 DIAGNOSIS — M545 Low back pain: Secondary | ICD-10-CM | POA: Diagnosis not present

## 2016-09-17 LAB — CBC WITH DIFFERENTIAL/PLATELET
BASOS ABS: 0 10*3/uL (ref 0.0–0.1)
BASOS PCT: 0 %
EOS ABS: 0.1 10*3/uL (ref 0.0–0.7)
Eosinophils Relative: 1 %
HCT: 39 % (ref 36.0–46.0)
HEMOGLOBIN: 12.8 g/dL (ref 12.0–15.0)
Lymphocytes Relative: 9 %
Lymphs Abs: 0.7 10*3/uL (ref 0.7–4.0)
MCH: 31.4 pg (ref 26.0–34.0)
MCHC: 32.8 g/dL (ref 30.0–36.0)
MCV: 95.6 fL (ref 78.0–100.0)
MONOS PCT: 12 %
Monocytes Absolute: 0.9 10*3/uL (ref 0.1–1.0)
NEUTROS PCT: 78 %
Neutro Abs: 6 10*3/uL (ref 1.7–7.7)
Platelets: 197 10*3/uL (ref 150–400)
RBC: 4.08 MIL/uL (ref 3.87–5.11)
RDW: 15.8 % — AB (ref 11.5–15.5)
WBC: 7.7 10*3/uL (ref 4.0–10.5)

## 2016-09-17 LAB — BASIC METABOLIC PANEL
ANION GAP: 9 (ref 5–15)
BUN: 15 mg/dL (ref 6–20)
CO2: 27 mmol/L (ref 22–32)
CREATININE: 0.92 mg/dL (ref 0.44–1.00)
Calcium: 9.3 mg/dL (ref 8.9–10.3)
Chloride: 102 mmol/L (ref 101–111)
GFR calc Af Amer: >60 mL/min (ref >60)
GFR calc non Af Amer: 58 mL/min — ABNORMAL LOW (ref >60)
GLUCOSE: 165 mg/dL — AB (ref 65–99)
Potassium: 4 mmol/L (ref 3.5–5.1)
SODIUM: 138 mmol/L (ref 135–145)

## 2016-09-17 LAB — PROTIME-INR
INR: 1.9
PROTHROMBIN TIME: 22 s — AB (ref 11.4–15.2)

## 2016-09-17 MED ORDER — HYDROCODONE-ACETAMINOPHEN 5-325 MG PO TABS: 1.0000 | ORAL_TABLET | Freq: Four times a day (QID) | ORAL | 0 refills | Status: DC | PRN

## 2016-09-17 MED ORDER — POLYETHYLENE GLYCOL 3350 17 GM/SCOOP PO POWD: 17.0000 g | Freq: Every day | ORAL | 0 refills | Status: DC

## 2016-09-17 MED ORDER — FENTANYL CITRATE (PF) 100 MCG/2ML IJ SOLN
50.0000 ug | Freq: Once | INTRAMUSCULAR | Status: AC
  Administered 2016-09-17: 50 ug via INTRAVENOUS
  Filled 2016-09-17: qty 2

## 2016-09-17 NOTE — Discharge Instructions (Signed)
You have a small compression fracture of T10 that appears new. Please follow-up with your PCP regarding close re-evaluation. You are given spine surgery follow-up as needed.   Return for worsening symptoms, including worsening pain, confusion, new numbness/weakness, inability to walk or any other symptoms concerning to you.

## 2016-09-17 NOTE — ED Provider Notes (Signed)
[facility] DEPT Provider Note   CSN: [id number] Arrival date & time: 09/17/16  1711     History   Chief Complaint Chief Complaint  Patient presents with  . Back Pain    HPI Courtney Evans is a 81 y.o. female.   Back Pain   This is a new problem. The current episode started yesterday. The problem occurs constantly. The problem has been gradually worsening. The pain is associated with lifting heavy objects. The pain is present in the thoracic spine. The quality of the pain is described as stabbing. The pain does not radiate. The pain is severe. The symptoms are aggravated by bending, twisting and certain positions. The pain is the same all the time. Pertinent negatives include no chest pain, no fever, no numbness, no abdominal pain, no abdominal swelling, no bowel incontinence, no perianal numbness, no bladder incontinence, no dysuria, no pelvic pain, no leg pain, no paresthesias, no paresis, no tingling and no weakness. She has tried analgesics for the symptoms. The treatment provided no relief. Risk factors: age, history of mechanical valve on coumadin, DM.    Past Medical History:  Diagnosis Date  . A-fib ([place])   . Compression fracture   . Coronary artery disease   . Diabetes mellitus without complication ([place])   . Hyperlipidemia   . Hypertension   . Pacemaker     Patient Active Problem List   Diagnosis Date Noted  . Anemia 08/26/2016  . Rheumatic heart disease 02/19/2016  . LBBB (left bundle branch block) 02/19/2016  . Pneumonia 11/19/2015  . Sepsis due to pneumonia ([place]) 11/19/2015  . CAP (community acquired pneumonia) 11/19/2015  . Coagulopathy ([place]) 11/19/2015  . Chronic atrial fibrillation ([place]) 11/19/2015  . Lactic acidosis 11/19/2015  . Mechanical heart valve present 11/19/2015    Past Surgical History:  Procedure Laterality Date  . AORTIC AND MITRAL VALVE REPLACEMENT  1994   w/ mechanical valves at [facility]  . CARDIAC SURGERY    . KYPHOPLASTY  2011   1 -  at [facility]  . KYPHOPLASTY  2012   2 - at [facility]  . KYPHOPLASTY  2013   1 - at [facility]  . KYPHOPLASTY  05/2015   1 - at [place]  . PACEMAKER GENERATOR CHANGE  2013  . [place]    [place] History    No data available       Home Medications    Prior to Admission medications   Medication Sig Start Date End Date Taking? Authorizing Provider  acetaminophen (TYLENOL) 500 MG tablet Take 500-1,000 mg by mouth every 6 (six) hours as needed for mild pain, moderate pain, fever or headache.   Yes Historical Provider, MD  amLODipine (NORVASC) 5 MG tablet Take 5 mg by mouth daily with breakfast.    Yes Historical Provider, MD  atorvastatin (LIPITOR) 20 MG tablet Take 20 mg by mouth daily with breakfast.    Yes Historical Provider, MD  cholecalciferol (VITAMIN D) 1000 units tablet Take 2,000 Units by mouth daily with breakfast.   Yes Historical Provider, MD  metFORMIN (GLUCOPHAGE) 500 MG tablet Take 500 mg by mouth every evening.    Yes Historical Provider, MD  nitroGLYCERIN (NITROSTAT) 0.4 MG SL tablet Place 0.4 mg under the tongue every 5 (five) minutes as needed for chest pain.   Yes Historical Provider,  MD  PARoxetine (PAXIL) 40 MG tablet Take 40 mg by mouth daily with breakfast.  05/10/10  Yes Historical Provider, MD  warfarin (COUMADIN) 3 MG tablet Take 1.5-3 mg by mouth every evening. Takes 3 mg everyday except 1.5mg  on Monday's 12/08/09  Yes Historical Provider, MD  clindamycin (CLEOCIN) 300 MG capsule Take 1 capsule (300 mg total) by mouth every 8 (eight) hours. Patient not taking: Reported on 09/17/2016 08/30/16   Nita Sells, MD  HYDROcodone-acetaminophen (NORCO/VICODIN) 5-325 MG tablet Take 1 tablet by mouth every 6 (six) hours as needed for moderate pain or severe pain. 09/17/16   Forde Dandy, MD  polyethylene glycol  powder (GLYCOLAX/MIRALAX) powder Take 17 g by mouth daily. Dissolve one capful of powder into any drink and take once daily. If no bowel movement in 3 days, take twice daily. If no bowel movement, take three times daily 09/17/16   Forde Dandy, MD    Family History Family History  Problem Relation Age of Onset  . Heart attack Mother   . Stroke Father     Social History Social History  Substance Use Topics  . Smoking status: Never Smoker  . Smokeless tobacco: Never Used  . Alcohol use No     Allergies   Codeine   Review of Systems Review of Systems  Constitutional: Negative for fever.  HENT: Negative for congestion.   Respiratory: Negative for shortness of breath.   Cardiovascular: Negative for chest pain.  Gastrointestinal: Negative for abdominal pain and bowel incontinence.  Genitourinary: Negative for bladder incontinence, dysuria and pelvic pain.  Musculoskeletal: Positive for back pain.  Allergic/Immunologic: Negative for immunocompromised state.  Neurological: Negative for tingling, weakness, numbness and paresthesias.  Hematological: Bruises/bleeds easily.  Psychiatric/Behavioral: Negative for confusion.     Physical Exam Updated Vital Signs BP 135/70   Pulse 84   Temp 97.6 F (36.4 C) (Oral)   Resp 18   SpO2 92%   Physical Exam Physical Exam  Nursing note and vitals reviewed. Constitutional: Well developed, well nourished, non-toxic, and in no acute distress Head: Normocephalic and atraumatic.  Mouth/Throat: Oropharynx is clear and moist.  Neck: Normal range of motion. Neck supple.  Cardiovascular: Normal rate and regular rhythm.   Pulmonary/Chest: Effort normal and breath sounds normal.  Abdominal: Soft. There is no tenderness. There is no rebound and no guarding.  Musculoskeletal: Diffuse tenderness along thoracic spine and left-sided thoracic paraspinal muscles. No deformities or step-offs.  Neurological: Alert, no facial droop, fluent speech, moves  all extremities symmetrically, full strength in bilateral upper and lower extremities, and sensation intact in bilateral upper and lower extremities Skin: Skin is warm and dry.  Psychiatric: Cooperative   ED Treatments / Results  Labs (all labs ordered are listed, but only abnormal results are displayed) Labs Reviewed  CBC WITH DIFFERENTIAL/PLATELET - Abnormal; Notable for the following:       Result Value   RDW 15.8 (*)    All other components within normal limits  PROTIME-INR - Abnormal; Notable for the following:    Prothrombin Time 22.0 (*)    All other components within normal limits  BASIC METABOLIC PANEL - Abnormal; Notable for the following:    Glucose, Bld 165 (*)    GFR calc non Af Amer 58 (*)    All other components within normal limits    EKG  EKG Interpretation None       Radiology Dg Ribs Unilateral W/chest Left  Result Date: 09/17/2016 CLINICAL DATA:  Acute  onset of left posterior rib pain after lifting injury. Initial encounter. EXAM: LEFT RIBS AND CHEST - 3+ VIEW COMPARISON:  Chest radiograph performed 11/20/2015, and CT of the chest performed 08/26/2016 FINDINGS: No displaced rib fractures are seen. The lungs are well-aerated. A small left pleural effusion is noted. Peribronchial thickening is seen. There is no evidence of focal opacification, pleural effusion or pneumothorax. The cardiomediastinal silhouette is mildly enlarged. The patient is status post median sternotomy. A pacemaker is noted overlying the left chest wall, with a single lead ending overlying the right ventricle. No acute osseous abnormalities are seen. The patient is status post vertebroplasty at the upper lumbar spine. IMPRESSION: 1. No displaced rib fracture seen. 2. Small left pleural effusion noted. Peribronchial thickening seen. 3. Mild cardiomegaly. Electronically Signed   By: Garald Balding M.D.   On: 09/17/2016 19:01   Ct Lumbar Spine Wo Contrast  Result Date: 09/17/2016 CLINICAL DATA:   81 year old female with lifting injury yesterday. Lumbar back pain radiating to the thoracic region. Left paraspinal muscle pain. Initial encounter. Pacemaker. EXAM: CT LUMBAR SPINE WITHOUT CONTRAST TECHNIQUE: Multidetector CT imaging of the lumbar spine was performed without intravenous contrast administration. Multiplanar CT image reconstructions were also generated. COMPARISON:  Chest CT 08/26/2016.  Chest radiographs 11/20/2015. FINDINGS: Segmentation: On the comparison chest CT it appears there are 11 pairs of full size ribs and small ribs at T12. On these images the L5 level is fully sacralized, with 4 lumbar type vertebral bodies. There is a vestigial L5-S1 disc space. Correlation with radiographs is recommended prior to any operative intervention. Alignment: The 2017 lateral chest radiograph included all of the lumbar levels to L4. Chronic L2, L3, and L4 compression fractures appears stable at that time, with L2 and L4 previously augmented. The L1 level appears stable and intact. The sacralized L5 level appears intact. Chronic T11 and T12 compression fractures which were previously augmented appears stable since the 2017 lateral chest and the recent chest CT. However, there is subtle T10 inferior endplate deformity which is new since 08/26/2016. Vertebrae: Diffuse osteopenia. In addition to the above findings the visible sacrum appears intact. Paraspinal and other soft tissues: Calcified aortic atherosclerosis. Negative visualized noncontrast abdominal viscera. Negative visualized posterior paraspinal soft tissues. Disc levels: Mild retropulsion at the chronic compression fracture levels. Mild superimposed lumbar disc bulging. IMPRESSION: 1. Transitional lumbosacral anatomy with a mostly sacralized L5 level (vestigial L5-S1 disc space). 2. The lumbar spine appears stable since 2017. 3. Chronic T11 and T12 compression fractures previously augmented. Mild T10 inferior endplate compression fracture suspected  and new since 08/26/2016. 4. If specific therapy such as vertebroplasty is desired Nuclear Medicine Whole-body Bone Scan would best confirm acuity. Electronically Signed   By: Genevie Ann M.D.   On: 09/17/2016 19:33    Procedures Procedures (including critical care time)  Medications Ordered in ED Medications  fentaNYL (SUBLIMAZE) injection 50 mcg (50 mcg Intravenous Given 09/17/16 1815)     Initial Impression / Assessment and Plan / ED Course  I have reviewed the triage vital signs and the nursing notes.  Pertinent labs & imaging results that were available during my care of the patient were reviewed by me and considered in my medical decision making (see chart for details).     81 year old female who presents with upper thoracic back pain in the setting of lifting an ironing board yesterday. Is uncomfortable secondary to pain, but nontoxic. Has normal vital signs. Has normal neurological exam. No concerning features to suggest  spinal cord involvement. History not supportive of intrathoracic etiologies such as aortic dissection.  Do not suspect spinal cord issue. Is tender in the mid to lower thoracic spine as well as left paraspinal muscles. Patient with history of nontraumatic vertebral fractures that required kyphoplasty in the past which was negative on initial x-rays. Thus Ct performed to identify fracture.  CT lumbar spine visualized and show mild T10 compression fracture which is new. Pain controlled after fentanyl and she ambulates normally. Will discharge with pain control. Strict return and follow-up instructions reviewed. She expressed understanding of all discharge instructions and felt comfortable with the plan of care.   Final Clinical Impressions(s) / ED Diagnoses   Final diagnoses:  Acute midline thoracic back pain  Closed wedge compression fracture of tenth thoracic vertebra, initial encounter (HCC)    New Prescriptions New Prescriptions   HYDROCODONE-ACETAMINOPHEN  (NORCO/VICODIN) 5-325 MG TABLET    Take 1 tablet by mouth every 6 (six) hours as needed for moderate pain or severe pain.   POLYETHYLENE GLYCOL POWDER (GLYCOLAX/MIRALAX) POWDER    Take 17 g by mouth daily. Dissolve one capful of powder into any drink and take once daily. If no bowel movement in 3 days, take twice daily. If no bowel movement, take three times daily     Forde Dandy, MD 09/17/16 2044

## 2016-09-17 NOTE — ED Notes (Signed)
RN at bedside collecting labs 

## 2016-09-17 NOTE — ED Triage Notes (Signed)
Patient c/o upper and lower back pain since "pulling something" yesterday while getting her ironing board out of the closet.

## 2016-09-17 NOTE — ED Notes (Signed)
Pt ambulated well in the hall without assistance.  Pt has verbalized increased back pain on ambulation, but otherwise pt's gait and balance steady.

## 2016-09-28 DIAGNOSIS — S22000A Wedge compression fracture of unspecified thoracic vertebra, initial encounter for closed fracture: Secondary | ICD-10-CM | POA: Diagnosis not present

## 2016-09-30 ENCOUNTER — Telehealth: Payer: Self-pay

## 2016-09-30 ENCOUNTER — Encounter: Payer: Self-pay | Admitting: Cardiovascular Disease

## 2016-09-30 DIAGNOSIS — Z7901 Long term (current) use of anticoagulants: Secondary | ICD-10-CM | POA: Diagnosis not present

## 2016-09-30 NOTE — Telephone Encounter (Signed)
Talked to daughter Lattie Haw.  INR was checked at [facility] today and was above goal (INR was 4.6 today).  Patient instructed hold warfarin today and tomorrow due to high INR.    Pre-op instructions have patient to stop taking warfarin on Sunday.    Instructions given to patient:  1.   Follow warfarin instructions give at [facility] today         (Friday and Saturday dose)  2.  HOLD warfarin dose on Sunday 10/02/16  3. Appointment on 10/03/16 at 11:15 (coumadin clinic) for INR check, and bridge plan instructions - confirmed with daughter Lattie Haw   April/8: No Coumadin or Lovenox.   April/9: Inject Lovenox 60  Every 12 hour   Est CrCl = 11ml/min ; wt 59kg

## 2016-09-30 NOTE — Telephone Encounter (Signed)
Follow Up    Pt daughter calling back regarding earlier conversation with nurse. She is needing clarification regarding her mothers medication

## 2016-09-30 NOTE — Telephone Encounter (Signed)
New message     Daughter calling stating has Dr. Sallyanne Kuster office received a cardiac clearance from Dr. Ellene Route - due to fracture in back.

## 2016-09-30 NOTE — Telephone Encounter (Signed)
Talked to daughter Lattie Haw and clarified orders

## 2016-09-30 NOTE — Telephone Encounter (Signed)
Sent via epic to Dr. Ellene Route and Tommy Medal MCr

## 2016-09-30 NOTE — Telephone Encounter (Signed)
Daughter aware that patient has been cleared Dr. Ellene Route has her stopping warfarin on Sunday 4/8 PM Patient needs lovenox bridging per Dr. Sallyanne Kuster  Will defer to clinical pharmacist for assistance OK to send Rx to [facility] in [place]

## 2016-09-30 NOTE — Telephone Encounter (Signed)
Request for surgical clearance:   1. What type surgery is being performed? T10 Kyphoplasty  2. When is this surgery scheduled? 10/10/16  3. Are there any medications that need to be held prior to surgery and how long? Warfarin 5 mg QD as directed per coumadin clinic  4. Name of the physician performing surgery: Dr Kristeen Miss  5. What is the office phone and fax number?   Phone [phone]  Fax [phone]

## 2016-10-02 NOTE — Progress Notes (Signed)
April/9:Inject Lovenox 60mg  in the fatty abdominal tissue at least 2 inches from the belly button twice a day about 12 hours apart, 8am and 8pm rotate sites. No Coumadin.  April/10: Inject Lovenox in the fatty tissue every 12 hours, 8am and 8pm. No Coumadin.  April/11: Inject Lovenox in the fatty tissue every 12 hours, 8am and 8pm. No Coumadin.  April/12:Inject Lovenox in the fatty tissue every 12 hours, 8am and 8pm. No Coumadin.  April/13: Inject Lovenox in the fatty tissue every 12 hours, 8am and 8pm. No Coumadin.  April/14: Inject Lovenox in the fatty tissue every 12 hours, 8am and 8pm. No Coumadin.  April/15: Inject Lovenox in the fatty tissue in the morning at 8 am (No PM dose). No Coumadin.  April/16: Procedure Day - No Lovenox - Resume Coumadin in the evening or as directed by doctor .  April/17: Resume Lovenox inject in the fatty tissue every 12 hours and take Coumadin.  April/18: Inject Lovenox in the fatty tissue every 12 hours and take Coumadin.  April/19: Inject Lovenox in the fatty tissue every 12 hours and take Coumadin.  April/20: Inject Lovenox in the fatty tissue every 12 hours and take Coumadin.  April/21: Inject Lovenox in the fatty tissue every 12 hours and take Coumadin.  April/22: Inject Lovenox in the fatty tissue every 12 hours and take Coumadin.  April/23:Coumadin appt to check INR.

## 2016-10-03 ENCOUNTER — Ambulatory Visit (INDEPENDENT_AMBULATORY_CARE_PROVIDER_SITE_OTHER): Payer: Medicare Other | Admitting: Pharmacist

## 2016-10-03 DIAGNOSIS — I482 Chronic atrial fibrillation, unspecified: Secondary | ICD-10-CM

## 2016-10-03 DIAGNOSIS — Z952 Presence of prosthetic heart valve: Secondary | ICD-10-CM | POA: Diagnosis not present

## 2016-10-03 LAB — POCT INR: INR: 1.6

## 2016-10-03 MED ORDER — ENOXAPARIN SODIUM 60 MG/0.6ML ~~LOC~~ SOLN: 60.0000 mg | Freq: Two times a day (BID) | SUBCUTANEOUS | 0 refills | Status: DC

## 2016-10-03 NOTE — Patient Instructions (Addendum)
April /9:Inject Lovenox 60mg  in the fatty abdominal tissue at least 2 inches from the belly button twice a day about 12 hours apart, 8am and 8pm rotate sites. No Coumadin.  April/10:Inject Lovenox in the fatty tissue every 12 hours, 8am and 8pm. No Coumadin.  April/11: Inject Lovenox in the fatty tissue every 12 hours, 8am and 8pm. No Coumadin.  April/12:Inject Lovenox in the fatty tissue every 12 hours, 8am and 8pm. No Coumadin.  April/13: Inject Lovenox in the fatty tissue every 12 hours, 8am and 8pm. No Coumadin.  April/14: Inject Lovenox in the fatty tissue every 12 hours, 8am and 8pm. No Coumadin.  April/15: Inject Lovenox in the fatty tissue in the morning at 8 am (No PM dose). No Coumadin.  April/16: Procedure Day - No Lovenox - Resume Coumadin in the evening or as directed by doctor .  April/17: Resume Lovenox inject in the fatty tissue every 12 hours and take Coumadin.  April/18: Inject Lovenox in the fatty tissue every 12 hours and take Coumadin.  April/19: Inject Lovenox in the fatty tissue every 12 hours and take Coumadin.  April/20: Inject Lovenox in the fatty tissue every 12 hours and take Coumadin.  April/21: Inject Lovenox in the fatty tissue every 12 hours and take Coumadin.  April/22: Inject Lovenox in the fatty tissue every 12 hours and take Coumadin.  April/23:Coumadin appt to check INR.

## 2016-10-10 ENCOUNTER — Telehealth: Payer: Self-pay | Admitting: Cardiovascular Disease

## 2016-10-10 DIAGNOSIS — M8008XA Age-related osteoporosis with current pathological fracture, vertebra(e), initial encounter for fracture: Secondary | ICD-10-CM | POA: Diagnosis not present

## 2016-10-10 DIAGNOSIS — Y999 Unspecified external cause status: Secondary | ICD-10-CM | POA: Diagnosis not present

## 2016-10-10 DIAGNOSIS — Y929 Unspecified place or not applicable: Secondary | ICD-10-CM | POA: Diagnosis not present

## 2016-10-10 DIAGNOSIS — S22070A Wedge compression fracture of T9-T10 vertebra, initial encounter for closed fracture: Secondary | ICD-10-CM | POA: Diagnosis not present

## 2016-10-10 DIAGNOSIS — X58XXXA Exposure to other specified factors, initial encounter: Secondary | ICD-10-CM | POA: Diagnosis not present

## 2016-10-10 DIAGNOSIS — Y939 Activity, unspecified: Secondary | ICD-10-CM | POA: Diagnosis not present

## 2016-10-10 NOTE — Telephone Encounter (Signed)
**  See bridge plan below** Given to patient during appointment on 10/03/16 **Next appt  10/17/16**  April/9:Inject Lovenox 60mg  in the fatty abdominal tissue at least 2 inches from the belly button twice a day about 12 hours apart, 8am and 8pm rotate sites. No Coumadin.  April/10: Inject Lovenox in the fatty tissue every 12 hours, 8am and 8pm. No Coumadin.  April/11: Inject Lovenox in the fatty tissue every 12 hours, 8am and 8pm. No Coumadin.  April/12:Inject Lovenox in the fatty tissue every 12 hours, 8am and 8pm. No Coumadin.  April/13: Inject Lovenox in the fatty tissue every 12 hours, 8am and 8pm. No Coumadin.  April/14: Inject Lovenox in the fatty tissue every 12 hours, 8am and 8pm. No Coumadin.  April/15: Inject Lovenox in the fatty tissue in the morning at 8 am (No PM dose). No Coumadin.  April/16: Procedure Day - No Lovenox - Resume Coumadin in the evening or as directed by doctor .  April/17: Resume Lovenox inject in the fatty tissue every 12 hours and take Coumadin.  April/18: Inject Lovenox in the fatty tissue every 12 hours and take Coumadin.  April/19: Inject Lovenox in the fatty tissue every 12 hours and take Coumadin.  April/20: Inject Lovenox in the fatty tissue every 12 hours and take Coumadin.  April/21: Inject Lovenox in the fatty tissue every 12 hours and take Coumadin.  April/22: Inject Lovenox in the fatty tissue every 12 hours and take Coumadin.  April/23:Coumadin appt to check INR.

## 2016-10-10 NOTE — Telephone Encounter (Signed)
Spoke with pt dtr, instructions discussed and questions answered.

## 2016-10-10 NOTE — Telephone Encounter (Signed)
Pt had surgery this morning on her back. Dr Ellene Route told her to check with the nurse about her blood thinner.

## 2016-10-14 DIAGNOSIS — Z7901 Long term (current) use of anticoagulants: Secondary | ICD-10-CM | POA: Diagnosis not present

## 2016-10-17 ENCOUNTER — Ambulatory Visit (INDEPENDENT_AMBULATORY_CARE_PROVIDER_SITE_OTHER): Payer: Medicare Other | Admitting: Pharmacist Clinician (PhC)/ Clinical Pharmacy Specialist

## 2016-10-17 DIAGNOSIS — I482 Chronic atrial fibrillation, unspecified: Secondary | ICD-10-CM

## 2016-10-17 DIAGNOSIS — Z952 Presence of prosthetic heart valve: Secondary | ICD-10-CM

## 2016-10-17 LAB — POCT INR: INR: 2.2

## 2016-10-19 ENCOUNTER — Inpatient Hospital Stay (HOSPITAL_COMMUNITY)
Admission: EM | Admit: 2016-10-19 | Discharge: 2016-10-24 | DRG: 293 | Disposition: A | Discharge status: Home / Self Care | Payer: Medicare Other | Attending: Internal Medicine | Admitting: Internal Medicine

## 2016-10-19 ENCOUNTER — Emergency Department (HOSPITAL_COMMUNITY): Payer: Medicare Other

## 2016-10-19 ENCOUNTER — Encounter (HOSPITAL_COMMUNITY): Payer: Self-pay | Admitting: Family Medicine

## 2016-10-19 DIAGNOSIS — E119 Type 2 diabetes mellitus without complications: Secondary | ICD-10-CM | POA: Diagnosis not present

## 2016-10-19 DIAGNOSIS — M5489 Other dorsalgia: Secondary | ICD-10-CM | POA: Diagnosis not present

## 2016-10-19 DIAGNOSIS — E785 Hyperlipidemia, unspecified: Secondary | ICD-10-CM | POA: Diagnosis present

## 2016-10-19 DIAGNOSIS — R0602 Shortness of breath: Secondary | ICD-10-CM | POA: Diagnosis not present

## 2016-10-19 DIAGNOSIS — M549 Dorsalgia, unspecified: Secondary | ICD-10-CM | POA: Diagnosis not present

## 2016-10-19 DIAGNOSIS — Z8249 Family history of ischemic heart disease and other diseases of the circulatory system: Secondary | ICD-10-CM

## 2016-10-19 DIAGNOSIS — I2510 Atherosclerotic heart disease of native coronary artery without angina pectoris: Secondary | ICD-10-CM | POA: Diagnosis present

## 2016-10-19 DIAGNOSIS — I447 Left bundle-branch block, unspecified: Secondary | ICD-10-CM | POA: Diagnosis not present

## 2016-10-19 DIAGNOSIS — G8929 Other chronic pain: Secondary | ICD-10-CM

## 2016-10-19 DIAGNOSIS — I5033 Acute on chronic diastolic (congestive) heart failure: Secondary | ICD-10-CM | POA: Diagnosis present

## 2016-10-19 DIAGNOSIS — M25511 Pain in right shoulder: Secondary | ICD-10-CM | POA: Diagnosis present

## 2016-10-19 DIAGNOSIS — M545 Low back pain: Secondary | ICD-10-CM | POA: Diagnosis present

## 2016-10-19 DIAGNOSIS — M546 Pain in thoracic spine: Secondary | ICD-10-CM

## 2016-10-19 DIAGNOSIS — Z885 Allergy status to narcotic agent status: Secondary | ICD-10-CM

## 2016-10-19 DIAGNOSIS — M5440 Lumbago with sciatica, unspecified side: Secondary | ICD-10-CM | POA: Diagnosis not present

## 2016-10-19 DIAGNOSIS — I482 Chronic atrial fibrillation, unspecified: Secondary | ICD-10-CM | POA: Diagnosis present

## 2016-10-19 DIAGNOSIS — I2721 Secondary pulmonary arterial hypertension: Secondary | ICD-10-CM | POA: Diagnosis present

## 2016-10-19 DIAGNOSIS — M810 Age-related osteoporosis without current pathological fracture: Secondary | ICD-10-CM | POA: Diagnosis present

## 2016-10-19 DIAGNOSIS — Z823 Family history of stroke: Secondary | ICD-10-CM

## 2016-10-19 DIAGNOSIS — Z952 Presence of prosthetic heart valve: Secondary | ICD-10-CM | POA: Diagnosis not present

## 2016-10-19 DIAGNOSIS — R0902 Hypoxemia: Secondary | ICD-10-CM | POA: Diagnosis present

## 2016-10-19 DIAGNOSIS — Z7984 Long term (current) use of oral hypoglycemic drugs: Secondary | ICD-10-CM | POA: Diagnosis not present

## 2016-10-19 DIAGNOSIS — Z79899 Other long term (current) drug therapy: Secondary | ICD-10-CM | POA: Diagnosis not present

## 2016-10-19 DIAGNOSIS — Z7901 Long term (current) use of anticoagulants: Secondary | ICD-10-CM | POA: Diagnosis not present

## 2016-10-19 DIAGNOSIS — N6489 Other specified disorders of breast: Secondary | ICD-10-CM | POA: Diagnosis present

## 2016-10-19 DIAGNOSIS — I5089 Other heart failure: Secondary | ICD-10-CM

## 2016-10-19 DIAGNOSIS — Z950 Presence of cardiac pacemaker: Secondary | ICD-10-CM

## 2016-10-19 DIAGNOSIS — J9811 Atelectasis: Secondary | ICD-10-CM | POA: Diagnosis not present

## 2016-10-19 DIAGNOSIS — I110 Hypertensive heart disease with heart failure: Secondary | ICD-10-CM | POA: Diagnosis not present

## 2016-10-19 DIAGNOSIS — R791 Abnormal coagulation profile: Secondary | ICD-10-CM

## 2016-10-19 DIAGNOSIS — R52 Pain, unspecified: Secondary | ICD-10-CM | POA: Diagnosis not present

## 2016-10-19 DIAGNOSIS — I509 Heart failure, unspecified: Secondary | ICD-10-CM | POA: Diagnosis not present

## 2016-10-19 DIAGNOSIS — I5032 Chronic diastolic (congestive) heart failure: Secondary | ICD-10-CM | POA: Diagnosis present

## 2016-10-19 LAB — COMPREHENSIVE METABOLIC PANEL
ALBUMIN: 4 g/dL (ref 3.5–5.0)
ALT: 16 U/L (ref 14–54)
AST: 26 U/L (ref 15–41)
Alkaline Phosphatase: 85 U/L (ref 38–126)
Anion gap: 8 (ref 5–15)
BUN: 11 mg/dL (ref 6–20)
CHLORIDE: 103 mmol/L (ref 101–111)
CO2: 27 mmol/L (ref 22–32)
CREATININE: 0.82 mg/dL (ref 0.44–1.00)
Calcium: 9.2 mg/dL (ref 8.9–10.3)
GFR calc Af Amer: >60 mL/min (ref >60)
GFR calc non Af Amer: >60 mL/min (ref >60)
Glucose, Bld: 146 mg/dL — ABNORMAL HIGH (ref 65–99)
POTASSIUM: 4.3 mmol/L (ref 3.5–5.1)
Sodium: 138 mmol/L (ref 135–145)
Total Bilirubin: 0.7 mg/dL (ref 0.3–1.2)
Total Protein: 7 g/dL (ref 6.5–8.1)

## 2016-10-19 LAB — TROPONIN I: TROPONIN I: 0.04 ng/mL — AB (ref <0.03)

## 2016-10-19 LAB — CBC WITH DIFFERENTIAL/PLATELET
BASOS ABS: 0 10*3/uL (ref 0.0–0.1)
BASOS PCT: 0 %
EOS PCT: 2 %
Eosinophils Absolute: 0.1 10*3/uL (ref 0.0–0.7)
HCT: 39.4 % (ref 36.0–46.0)
Hemoglobin: 13 g/dL (ref 12.0–15.0)
LYMPHS PCT: 15 %
Lymphs Abs: 0.9 10*3/uL (ref 0.7–4.0)
MCH: 31.7 pg (ref 26.0–34.0)
MCHC: 33 g/dL (ref 30.0–36.0)
MCV: 96.1 fL (ref 78.0–100.0)
MONO ABS: 0.8 10*3/uL (ref 0.1–1.0)
Monocytes Relative: 13 %
NEUTROS ABS: 4.5 10*3/uL (ref 1.7–7.7)
Neutrophils Relative %: 70 %
PLATELETS: 207 10*3/uL (ref 150–400)
RBC: 4.1 MIL/uL (ref 3.87–5.11)
RDW: 14.6 % (ref 11.5–15.5)
WBC: 6.4 10*3/uL (ref 4.0–10.5)

## 2016-10-19 LAB — COOXEMETRY PANEL
Carboxyhemoglobin: 1.3 % (ref 0.5–1.5)
METHEMOGLOBIN: 0.8 % (ref 0.0–1.5)
O2 Saturation: 97.9 %
Total hemoglobin: 12.5 g/dL (ref 12.0–16.0)

## 2016-10-19 LAB — PROTIME-INR
INR: 1.96
PROTHROMBIN TIME: 22.6 s — AB (ref 11.4–15.2)

## 2016-10-19 LAB — BRAIN NATRIURETIC PEPTIDE: B NATRIURETIC PEPTIDE 5: 86.5 pg/mL (ref 0.0–100.0)

## 2016-10-19 MED ORDER — IOPAMIDOL (ISOVUE-370) INJECTION 76%
INTRAVENOUS | Status: AC
  Filled 2016-10-19: qty 100

## 2016-10-19 MED ORDER — IOPAMIDOL (ISOVUE-370) INJECTION 76%
100.0000 mL | Freq: Once | INTRAVENOUS | Status: AC | PRN
  Administered 2016-10-19: 100 mL via INTRAVENOUS

## 2016-10-19 NOTE — ED Triage Notes (Signed)
Patient is from home and transported via [facility]. Pt has outpatient laparoscopic left sided back surgery 2 weeks ago at [facility] to repair fracture. Pt is complaining of right mid to lower back pain. Pt denies any recently injury or fall. EMS administered FENTANYL 7mcq in transport. Also, pt does not wear oxygen at home but her oxygen level was decreased on EMS arrival and placed on oxygen.

## 2016-10-19 NOTE — ED Notes (Signed)
Ambulated pt and O2 dropped to 83%

## 2016-10-19 NOTE — ED Notes (Signed)
Pt in radiology 

## 2016-10-19 NOTE — ED Provider Notes (Signed)
[facility] DEPT Provider Note   CSN: [id number] Arrival date & time: 10/19/16  1704     History   Chief Complaint Chief Complaint  Patient presents with  . Back Pain    HPI Courtney Evans is a 81 y.o. female.  HPI    Patient is a pleasant 81 year old female presenting with pain To her right scapula/right paraspinal T-spine. Patient had recent kyphoplasty after a fall. This was done 2 weeks ago. She was taken off of her warfarin and transitioned to Lovenox at that time. Patient reports today that she's been having increasing pain in her back so she called EMS to bring her here. Of note patient was 85% on room air when EMS got to her. Patient denies any shortness of breath, or chest pain. Just pain with deep breaths and pain in her back.  Past Medical History:  Diagnosis Date  . A-fib ([place])   . Compression fracture   . Coronary artery disease   . Diabetes mellitus without complication ([place])   . Hyperlipidemia   . Hypertension   . Pacemaker     Patient Active Problem List   Diagnosis Date Noted  . Anemia 08/26/2016  . Rheumatic heart disease 02/19/2016  . LBBB (left bundle branch block) 02/19/2016  . Pneumonia 11/19/2015  . Sepsis due to pneumonia ([place]) 11/19/2015  . CAP (community acquired pneumonia) 11/19/2015  . Coagulopathy ([place]) 11/19/2015  . Chronic atrial fibrillation ([place]) 11/19/2015  . Lactic acidosis 11/19/2015  . Mechanical heart valve present 11/19/2015    Past Surgical History:  Procedure Laterality Date  . AORTIC AND MITRAL VALVE REPLACEMENT  1994   w/ mechanical valves at [facility]  . BACK SURGERY    . CARDIAC SURGERY    . KYPHOPLASTY  2011   1 - at [facility]  . KYPHOPLASTY  2012   2 - at [facility]  . KYPHOPLASTY  2013   1 - at [place]  . KYPHOPLASTY  05/2015   1 - at [place]  . PACEMAKER GENERATOR CHANGE  2013  . [place]    [place] History    No data available       Home Medications    Prior to Admission medications   Medication Sig Start Date End Date Taking? Authorizing Provider  acetaminophen (TYLENOL) 500 MG tablet Take 500-1,000 mg by mouth every 6 (six) hours as needed for mild pain, moderate pain, fever or headache.   Yes Historical Provider, MD  amLODipine (NORVASC) 5 MG tablet Take 5 mg by mouth daily.    Yes Historical Provider, MD  atorvastatin (LIPITOR) 20 MG tablet Take 20 mg by mouth daily.    Yes Historical Provider, MD  cholecalciferol (VITAMIN D) 1000 units tablet Take 2,000 Units by mouth daily.    Yes Historical Provider, MD  metFORMIN (GLUCOPHAGE) 500 MG tablet Take 500 mg by mouth 2 (two) times daily with a meal.    Yes Historical Provider, MD  nitroGLYCERIN (NITROSTAT) 0.4 MG SL tablet Place 0.4 mg under the tongue every 5 (five) minutes as needed for chest pain.   Yes Historical Provider, MD  polyethylene glycol (MIRALAX / GLYCOLAX) packet Take 17 g by mouth daily as needed for mild constipation.   Yes Historical Provider, MD  warfarin (COUMADIN) 3 MG tablet Take 1.5-3 mg by mouth every evening. Pt  takes one-half tablet on Monday and one tablet all other days.   Yes Historical Provider, MD  enoxaparin (LOVENOX) 60 MG/0.6ML injection Inject 0.6 mLs (60 mg total) into the skin every 12 (twelve) hours. Patient not taking: Reported on 10/19/2016 10/03/16   Sanda Klein, MD    Family History Family History  Problem Relation Age of Onset  . Heart attack Mother   . Stroke Father     Social History Social History  Substance Use Topics  . Smoking status: Never Smoker  . Smokeless tobacco: Never Used  . Alcohol use No     Allergies   Codeine   Review of Systems Review of Systems  Constitutional: Negative for activity change.  HENT: Negative for congestion.   Respiratory: Positive for shortness of breath.   Cardiovascular: Negative for  chest pain.  Gastrointestinal: Negative for abdominal pain.  Musculoskeletal: Positive for back pain.     Physical Exam Updated Vital Signs BP (!) 151/88 (BP Location: Left Arm)   Pulse 67   Temp 97.9 F (36.6 C) (Oral)   Resp 18   Ht 5\' 4"  (1.626 m)   Wt 130 lb (59 kg)   SpO2 95%   BMI 22.31 kg/m   Physical Exam  Constitutional: She is oriented to person, place, and time. She appears well-developed and well-nourished.  HENT:  Head: Normocephalic and atraumatic.  Eyes: Right eye exhibits no discharge.  Cardiovascular: Normal rate.   Pulmonary/Chest: Effort normal. No respiratory distress. She has no wheezes. She exhibits no tenderness.  Abdominal: Soft. There is no tenderness.  Musculoskeletal:  No tenderness over CT or L-spine. Small scar from previous surgery.  Neurological: She is oriented to person, place, and time.  Skin: Skin is warm and dry. She is not diaphoretic.  Psychiatric: She has a normal mood and affect.  Nursing note and vitals reviewed.    ED Treatments / Results  Labs (all labs ordered are listed, but only abnormal results are displayed) Labs Reviewed  COMPREHENSIVE METABOLIC PANEL - Abnormal; Notable for the following:       Result Value   Glucose, Bld 146 (*)    All other components within normal limits  CBC WITH DIFFERENTIAL/PLATELET  COOXEMETRY PANEL    EKG  EKG Interpretation None       Radiology Dg Chest 2 View  Result Date: 10/19/2016 CLINICAL DATA:  Initial evaluation for acute mid to lower back pain. EXAM: CHEST  2 VIEW COMPARISON:  Prior radiograph from 09/17/2016. FINDINGS: Single E left-sided transvenous pacemaker/ AICD, unchanged. Median sternotomy wires noted. Stable cardiomegaly. Mediastinal silhouette grossly within normal limits. Lungs hypoinflated. Mild diffuse pulmonary pulmonary vascular congestion without frank pulmonary edema. Probable superimposed bibasilar atelectasis. No other focal infiltrates. Sequela prior  vertebral augmentation seen at multiple levels within the thoracolumbar spine. Scattered compression deformities are grossly stable. No acute osseus abnormality. IMPRESSION: 1. Shallow lung inflation with associated bibasilar atelectasis. No other active cardiopulmonary disease. 2. Stable cardiomegaly with mild perihilar pulmonary vascular congestion without overt pulmonary edema. Electronically Signed   By: Jeannine Boga M.D.   On: 10/19/2016 19:11   Dg Thoracic Spine 2 View  Result Date: 10/19/2016 CLINICAL DATA:  Initial evaluation for acute right mid to lower back pain. Status post recent vertebral augmentation. EXAM: THORACIC SPINE 2 VIEWS COMPARISON:  Prior radiograph from 09/28/2016. FINDINGS: Stable alignment with exaggeration of the normal thoracic kyphosis. Sequelae of prior vertebral augmentation seen at multiple levels within the lower thoracic and upper lumbar spine. No  complication identified. Overall, scattered compression deformities are stable from prior with maintained vertebral body height. No acute osseous abnormality. No soft tissue abnormality. IMPRESSION: 1. No acute abnormality identified within the thoracic spine. 2. Stable appearance of multilevel compression deformities involving the mid and lower thoracic spine with sequelae of prior vertebral augmentation. Electronically Signed   By: Jeannine Boga M.D.   On: 10/19/2016 19:15   Ct Angio Chest Pe W And/or Wo Contrast  Result Date: 10/19/2016 CLINICAL DATA:  Hypoxia.  Recent lumbar spine surgery. EXAM: CT ANGIOGRAPHY CHEST WITH CONTRAST TECHNIQUE: Multidetector CT imaging of the chest was performed using the standard protocol during bolus administration of intravenous contrast. Multiplanar CT image reconstructions and MIPs were obtained to evaluate the vascular anatomy. CONTRAST:  100 mL Isovue 370 COMPARISON:  Thoracic spine radiograph and two-view chest x-ray from the same. CT chest 08/26/2016 FINDINGS:  Cardiovascular: The heart is enlarged. Bilateral atrial enlargement is noted. Atherosclerotic changes are noted in the aorta without aneurysm or definite stenosis. Pulmonary arterial opacification is satisfactory. There are no significant focal filling defects suggest pulmonary embolus. Mediastinum/Nodes: No significant mediastinal or axillary adenopathy is present. Lungs/Pleura: Bilateral pleural effusions are noted, left greater than right. Mild dependent atelectasis is present. There is no significant airspace consolidation. No nodule or mass lesion is present. Upper Abdomen: Within normal limits. Musculoskeletal: Spinal augmentation is again noted T9, T10, T11. The T4 vertebral body height is preserved. Fractures are present through the remaining thoracic vertebral bodies. There are not significantly changed since March 2nd. Exaggerated thoracic kyphosis is associated. A hypoplastic left fourth rib is noted. Review of the MIP images confirms the above findings. IMPRESSION: 1. No evidence for pulmonary embolus. 2. Cardiomegaly without failure. 3. Median sternotomy for CABG. 4. Small bilateral pleural effusions, left greater than right with associated atelectasis. Electronically Signed   By: San Morelle M.D.   On: 10/19/2016 20:35    Procedures Procedures (including critical care time)  Medications Ordered in ED Medications  iopamidol (ISOVUE-370) 76 % injection (not administered)  iopamidol (ISOVUE-370) 76 % injection 100 mL (100 mLs Intravenous Contrast Given 10/19/16 2013)     Initial Impression / Assessment and Plan / ED Course  I have reviewed the triage vital signs and the nursing notes.  Pertinent labs & imaging results that were available during my care of the patient were reviewed by me and considered in my medical decision making (see chart for details).     Patient is very well-appearing 81 year old female presenting with pleuritic back pain/scapular pain. Patient feels like  it might be a musculoskeletal pain. On exam there is no palpation that elicits the pain. However patient is strangely hypoxic on room air. Patient requiring 3 L to stay above 92%. Patient's not using excess muscles. She denies any other history of lung disease. It does not appear that she was hypoxic one week ago when she was having her surgery.  I did a CT angiogram given the patient came off of her anticoagulation and should recent certain outpatient surgery. However adniowas negative. Chest x-ray was negative. I've no reason for her new onset of hypoxia. I tried to walk patient in hopes that increasing mobility would decrease atelectasis. However patient became hypoxic to the mid 80s.  Sent of carboxyhemoglobin and CO.   Final Clinical Impressions(s) / ED Diagnoses   Final diagnoses:  None    New Prescriptions New Prescriptions   No medications on file     Ki Luckman Julio Alm, MD 10/20/16  0058  

## 2016-10-19 NOTE — ED Notes (Signed)
Told Dr Thomasene Lot about troponin

## 2016-10-19 NOTE — H&P (Signed)
Courtney Evans WJX:[id number] DOB: 12-Jun-1936 DOA: 10/19/2016     PCP: Cari Caraway, MD   Outpatient Specialists: [facility]  Patient coming from:   home Lives  With family for the past 2 weeks    Chief Complaint: back pain   HPI: Courtney Evans is a 81 y.o. female with medical history significant of rheumatic heart disease, status post mechanical mitral and aortic valve, diastolic heart failure, hx of pulmonary artery hypertension   osteoporosis vertebral compression fractions and has had multiple kyphoplasty's, hyperlipidemia, hypertension, a.fib on coumadin and LBBB, DM 2, sp pacemaker  Presented with right mid to lower back pain started for 3-4 weeks she states she was ignoring the pain but finally decided to get it checked out so she called 911. No recent injury or fall, no recent worsening of the pain. It has been steady. She was given fentanyl patient was noted to be hypoxic down to 85% on RA does not have a history of hypoxia . Patient had not had any shortness of breath or chest pain but does hurt to take a deep breath in her back. When patient ambulated oxygen saturation dropped to 83%. Denies shortness of breath no chest pain her hematoma of the left breast have been going down. denies dyspnea on exertion. She denies weight gain.  NO cough no wheezing no sick contacts.  In the beginning of April she had a fall resulting in compression fractures and was undergone kyphoplasty on 16th of April her Coumadin was held was bridged with Lovenox.   March 2018 patient was admitted with bleeding from a site of breast biopsy was found to have hemoglobin of 9 and INR 3 and recheck her hemoglobin dropped down to 6 she was admitted and transfused 2 units felt to have possible infection of breath hematoma and treated with antibiotic coverage discharge on clindamycin    Regarding pertinent Chronic problems: History of rheumatic heart disease   In 1994 she underwent aortic and mitral valve replacement  at [facility] receiving 2 mechanical valves. In 1998 received a single-chamber permanent pacemaker generator change out in 2013  [facility] [place]).  History of diastolic heart failure but stays euvolemic without diuretic, history of pulmonary artery hypertension in the 50-55 mmHg range  Hospitalized in May 2017 with pneumonia. In March 2018 for hematoma of the breast after biopsy requiring blood transfusion   Last echo in December 2016 as. per cardiology noted showed diastolic dysfunction  IN ER:  Temp (24hrs), Avg:97.9 F (36.6 C), Min:97.9 F (36.6 C), Max:97.9 F (36.6 C)       RR 18(%% on 2L Hr 67BP 151/88 WBC 6.4 Hg 13 Na 138 K 4.3 Cr 0.82 CXR: Shallow lung inflation basilar atelectasis, megaly mild peripheral vascular congestion no overt edema. Thoracic spine imaged stable.  Following Medications were ordered in ER: Medications  iopamidol (ISOVUE-370) 76 % injection (not administered)  iopamidol (ISOVUE-370) 76 % injection 100 mL (100 mLs Intravenous Contrast Given 10/19/16 2013)     Hospitalist was called for admission for  diagnoses of hypoxia with imaging suggestive of mild fluid overload status secondary to acute on chronic diastolic CHF  Review of Systems:    Pertinent positives include: Back pain  Constitutional:  No weight loss, night sweats, Fevers, chills, fatigue, weight loss  HEENT:  No headaches, Difficulty swallowing,Tooth/dental problems,Sore throat,  No sneezing, itching, ear ache, nasal congestion, post nasal drip,  Cardio-vascular:  No chest pain, Orthopnea, PND, anasarca, dizziness, palpitations.no Bilateral lower extremity swelling  GI:  No heartburn, indigestion, abdominal pain, nausea, vomiting, diarrhea, change in bowel habits, loss of appetite, melena, blood in stool, hematemesis Resp:  no shortness of breath at rest. No dyspnea on exertion, No excess mucus, no productive cough, No non-productive cough, No coughing up of blood.No change in  color of mucus.No wheezing. Skin:  no rash or lesions. No jaundice GU:  no dysuria, change in color of urine, no urgency or frequency. No straining to urinate.  No flank pain.  Musculoskeletal:  No joint pain or no joint swelling. No decreased range of motion. No back pain.  Psych:  No change in mood or affect. No depression or anxiety. No memory loss.  Neuro: no localizing neurological complaints, no tingling, no weakness, no double vision, no gait abnormality, no slurred speech, no confusion  As per HPI otherwise 10 point review of systems negative.   Past Medical History: Past Medical History:  Diagnosis Date  . A-fib ([place])   . Compression fracture   . Coronary artery disease   . Diabetes mellitus without complication ([place])   . Hyperlipidemia   . Hypertension   . Pacemaker    Past Surgical History:  Procedure Laterality Date  . AORTIC AND MITRAL VALVE REPLACEMENT  1994   w/ mechanical valves at [facility]  . BACK SURGERY    . CARDIAC SURGERY    . [facility]LASTY  2011   [facility]  . KYPHOPLASTY  2012   2 - at [facility]  . KYPHOPLASTY  2013   1 - at [facility]  . KYPHOPLASTY  05/2015   1 - at [place]  . PACEMAKER GENERATOR CHANGE  2013  . [place]     Social History:  Ambulatory  Gilford Rile   reports that she has never smoked. She has never used smokeless tobacco. She reports that she does not drink alcohol or use drugs.  Allergies:   Allergies  Allergen Reactions  . Codeine Nausea And Vomiting       Family History:   Family History  Problem Relation Age of Onset  . Heart attack Mother   . Stroke Father     Medications: Prior to Admission medications   Medication Sig Start Date End Date Taking? Authorizing Provider  acetaminophen (TYLENOL) 500 MG tablet Take 500-1,000 mg by mouth every 6 (six)  hours as needed for mild pain, moderate pain, fever or headache.   Yes Historical Provider, MD  amLODipine (NORVASC) 5 MG tablet Take 5 mg by mouth daily.    Yes Historical Provider, MD  atorvastatin (LIPITOR) 20 MG tablet Take 20 mg by mouth daily.    Yes Historical Provider, MD  cholecalciferol (VITAMIN D) 1000 units tablet Take 2,000 Units by mouth daily.    Yes Historical Provider, MD  metFORMIN (GLUCOPHAGE) 500 MG tablet Take 500 mg by mouth 2 (two) times daily with a meal.    Yes Historical Provider, MD  nitroGLYCERIN (NITROSTAT) 0.4 MG SL tablet Place 0.4 mg under the tongue every 5 (five) minutes as needed for chest pain.   Yes Historical Provider, MD  polyethylene glycol (MIRALAX / GLYCOLAX) packet Take 17 g by mouth daily as needed for mild constipation.   Yes Historical Provider, MD  warfarin (COUMADIN) 3 MG tablet Take 1.5-3 mg by mouth every evening. Pt takes one-half tablet on Monday and  one tablet all other days.   Yes Historical Provider, MD  enoxaparin (LOVENOX) 60 MG/0.6ML injection Inject 0.6 mLs (60 mg total) into the skin every 12 (twelve) hours. Patient not taking: Reported on 10/19/2016 10/03/16   Sanda Klein, MD    Physical Exam: Patient Vitals for the past 24 hrs:  BP Temp Temp src Pulse Resp SpO2 Height Weight  10/19/16 2119 (!) 151/88 - - 67 18 95 % - -  10/19/16 1909 (!) 163/80 - - 66 18 93 % - -  10/19/16 1734 - - - - - - 5\' 4"  (1.626 m) 59 kg (130 lb)  10/19/16 1733 - - - - - 97 % - -  10/19/16 1717 (!) 158/77 97.9 F (36.6 C) Oral 74 15 (!) 88 % - -    1. General:  in No Acute distress 2. Psychological: Alert and  Oriented 3. Head/ENT:   Moist Mucous Membranes                          Head Non traumatic, neck supple                            Poor Dentition 4. SKIN: normal  Skin turgor,  Skin clean Dry and intact no rash  Left breast hematoma palpable 5. Heart: Regular rate and rhythm  Click Murmur, Rub or gallop 6. Lungs:   no wheezes some crackles  At  the bases 7. Abdomen: Soft,  non-tender, Non distended 8. Lower extremities: no clubbing, cyanosis, or edema 9. Neurologically Grossly intact, moving all 4 extremities equally  10. MSK: Normal range of motion   body mass index is 22.31 kg/m.  Labs on Admission:   Labs on Admission: I have personally reviewed following labs and imaging studies  CBC:  Recent Labs Lab 10/19/16 1825  WBC 6.4  NEUTROABS 4.5  HGB 13.0  HCT 39.4  MCV 96.1  PLT 161   Basic Metabolic Panel:  Recent Labs Lab 10/19/16 1825  NA 138  K 4.3  CL 103  CO2 27  GLUCOSE 146*  BUN 11  CREATININE 0.82  CALCIUM 9.2   GFR: Estimated Creatinine Clearance: 48.8 mL/min (by C-G formula based on SCr of 0.82 mg/dL). Liver Function Tests:  Recent Labs Lab 10/19/16 1825  AST 26  ALT 16  ALKPHOS 85  BILITOT 0.7  PROT 7.0  ALBUMIN 4.0   No results for input(s): LIPASE, AMYLASE in the last 168 hours. No results for input(s): AMMONIA in the last 168 hours. Coagulation Profile:  Recent Labs Lab 10/17/16 1214  INR 2.2   Cardiac Enzymes: No results for input(s): CKTOTAL, CKMB, CKMBINDEX, TROPONINI in the last 168 hours. BNP (last 3 results) No results for input(s): PROBNP in the last 8760 hours. HbA1C: No results for input(s): HGBA1C in the last 72 hours. CBG: No results for input(s): GLUCAP in the last 168 hours. Lipid Profile: No results for input(s): CHOL, HDL, LDLCALC, TRIG, CHOLHDL, LDLDIRECT in the last 72 hours. Thyroid Function Tests: No results for input(s): TSH, T4TOTAL, FREET4, T3FREE, THYROIDAB in the last 72 hours. Anemia Panel: No results for input(s): VITAMINB12, FOLATE, FERRITIN, TIBC, IRON, RETICCTPCT in the last 72 hours. Urine analysis: No results found for: COLORURINE, APPEARANCEUR, LABSPEC, PHURINE, GLUCOSEU, HGBUR, BILIRUBINUR, KETONESUR, PROTEINUR, UROBILINOGEN, NITRITE, LEUKOCYTESUR Sepsis Labs: @LABRCNTIP (procalcitonin:4,lacticidven:4) )No results found for this or  any previous visit (from the past 240 hour(s)).  UA not ordered  No results found for: HGBA1C  Estimated Creatinine Clearance: 48.8 mL/min (by C-G formula based on SCr of 0.82 mg/dL).  BNP (last 3 results) No results for input(s): PROBNP in the last 8760 hours.   ECG REPORT ordered  [facility] Weights   10/19/16 1734  Weight: 59 kg (130 lb)     Cultures:    Component Value Date/Time   SDES SPUTUM 11/21/2015 1430   SDES SPUTUM 11/21/2015 1430   SPECREQUEST NONE 11/21/2015 1430   SPECREQUEST NONE 11/21/2015 1430   CULT  11/21/2015 1430    Consistent with normal respiratory flora. Performed at [facility] 11/21/2015 FINAL 11/21/2015 1430   REPTSTATUS 11/24/2015 FINAL 11/21/2015 1430     Radiological Exams on Admission: Dg Chest 2 View  Result Date: 10/19/2016 CLINICAL DATA:  Initial evaluation for acute mid to lower back pain. EXAM: CHEST  2 VIEW COMPARISON:  Prior radiograph from 09/17/2016. FINDINGS: Single E left-sided transvenous pacemaker/ AICD, unchanged. Median sternotomy wires noted. Stable cardiomegaly. Mediastinal silhouette grossly within normal limits. Lungs hypoinflated. Mild diffuse pulmonary pulmonary vascular congestion without frank pulmonary edema. Probable superimposed bibasilar atelectasis. No other focal infiltrates. Sequela prior vertebral augmentation seen at multiple levels within the thoracolumbar spine. Scattered compression deformities are grossly stable. No acute osseus abnormality. IMPRESSION: 1. Shallow lung inflation with associated bibasilar atelectasis. No other active cardiopulmonary disease. 2. Stable cardiomegaly with mild perihilar pulmonary vascular congestion without overt pulmonary edema. Electronically Signed   By: Jeannine Boga M.D.   On: 10/19/2016 19:11   Dg Thoracic Spine 2 View  Result Date: 10/19/2016 CLINICAL DATA:  Initial evaluation for acute right mid to lower back pain. Status post recent vertebral  augmentation. EXAM: THORACIC SPINE 2 VIEWS COMPARISON:  Prior radiograph from 09/28/2016. FINDINGS: Stable alignment with exaggeration of the normal thoracic kyphosis. Sequelae of prior vertebral augmentation seen at multiple levels within the lower thoracic and upper lumbar spine. No complication identified. Overall, scattered compression deformities are stable from prior with maintained vertebral body height. No acute osseous abnormality. No soft tissue abnormality. IMPRESSION: 1. No acute abnormality identified within the thoracic spine. 2. Stable appearance of multilevel compression deformities involving the mid and lower thoracic spine with sequelae of prior vertebral augmentation. Electronically Signed   By: Jeannine Boga M.D.   On: 10/19/2016 19:15   Ct Angio Chest Pe W And/or Wo Contrast  Result Date: 10/19/2016 CLINICAL DATA:  Hypoxia.  Recent lumbar spine surgery. EXAM: CT ANGIOGRAPHY CHEST WITH CONTRAST TECHNIQUE: Multidetector CT imaging of the chest was performed using the standard protocol during bolus administration of intravenous contrast. Multiplanar CT image reconstructions and MIPs were obtained to evaluate the vascular anatomy. CONTRAST:  100 mL Isovue 370 COMPARISON:  Thoracic spine radiograph and two-view chest x-ray from the same. CT chest 08/26/2016 FINDINGS: Cardiovascular: The heart is enlarged. Bilateral atrial enlargement is noted. Atherosclerotic changes are noted in the aorta without aneurysm or definite stenosis. Pulmonary arterial opacification is satisfactory. There are no significant focal filling defects suggest pulmonary embolus. Mediastinum/Nodes: No significant mediastinal or axillary adenopathy is present. Lungs/Pleura: Bilateral pleural effusions are noted, left greater than right. Mild dependent atelectasis is present. There is no significant airspace consolidation. No nodule or mass lesion is present. Upper Abdomen: Within normal limits. Musculoskeletal: Spinal  augmentation is again noted T9, T10, T11. The T4 vertebral body height is preserved. Fractures are present through the remaining thoracic vertebral bodies. There are not significantly changed since March 2nd.  Exaggerated thoracic kyphosis is associated. A hypoplastic left fourth rib is noted. Review of the MIP images confirms the above findings. IMPRESSION: 1. No evidence for pulmonary embolus. 2. Cardiomegaly without failure. 3. Median sternotomy for CABG. 4. Small bilateral pleural effusions, left greater than right with associated atelectasis. Electronically Signed   By: San Morelle M.D.   On: 10/19/2016 20:35    Chart has been reviewed    Assessment/Plan  81 y.o. female with medical history significant of rheumatic heart disease, status post mechanical mitral and aortic valve, diastolic heart failure, hx of pulmonary artery hypertension   osteoporosis vertebral compression fractions and has had multiple kyphoplasty's, hyperlipidemia, hypertension, a.fib on coumadin and LBBB, DM 2, sp pacemaker admitted with Hypoxia most likely secondary to mild diastolic CHF exacerbation  Present on Admission:   . Back pain - musculoskeletal currently stable treat supportively . Hypoxia multifactorial likely secondary to mild fluid overload and splinting from back pain Will obtain echogram cycle cardiac enzymes diurese and follow patient has history of pulmonary hypertension also likely contributing  . Acute on chronic diastolic CHF (congestive heart failure) (HCC) contributing to hypoxia will give gentle diuresis and monitor repeat echogram cycle cardiac enzymes . Breast hematoma stable monitor no evidence of infectious process DM 2  - hold by mouth medications order sliding scale . Chronic atrial fibrillation (HCC) -           - CHA2DS2 vas score 4 : continue current anticoagulation with  Coumadin per pharmacy,          -  Rate control:  Currently self controlled    Street mitral and aortic  mechanical valves subtherapeutic INR bridged with Lovenox until therapeutic . LBBB (left bundle branch block) - chronic stable Other plan as per orders.  DVT prophylaxis:  couamdin     Code Status:  FULL CODE  as per patient    Family Communication:   Family not at  Bedside     Disposition Plan:     To home once workup is complete and patient is stable                         Would benefit from PT/OT eval prior to DC  ordered                                                 Consults called: email cardiology   Admission status:  inpatient      Level of care   tele            I have spent a total of 56 min on this admission   Courtney Evans 10/19/2016, 11:16 PM    [facility]  Pager [phone]   after 2 AM please page floor coverage PA If 7AM-7PM, please contact the day team taking care of the patient  [email or web address]  Password [email or web address]

## 2016-10-19 NOTE — ED Notes (Signed)
Patient taken to CT.

## 2016-10-20 DIAGNOSIS — R791 Abnormal coagulation profile: Secondary | ICD-10-CM

## 2016-10-20 LAB — TROPONIN I
TROPONIN I: 0.04 ng/mL — AB (ref <0.03)
TROPONIN I: 0.03 ng/mL — AB (ref <0.03)
Troponin I: 0.04 ng/mL (ref <0.03)
Troponin I: 0.04 ng/mL (ref <0.03)
Troponin I: 0.03 ng/mL (ref <0.03)

## 2016-10-20 LAB — CBC WITH DIFFERENTIAL/PLATELET
BASOS PCT: 0 %
Basophils Absolute: 0 10*3/uL (ref 0.0–0.1)
Eosinophils Absolute: 0.1 10*3/uL (ref 0.0–0.7)
Eosinophils Relative: 3 %
HEMATOCRIT: 36.5 % (ref 36.0–46.0)
HEMOGLOBIN: 12 g/dL (ref 12.0–15.0)
LYMPHS PCT: 22 %
Lymphs Abs: 1.2 10*3/uL (ref 0.7–4.0)
MCH: 30.8 pg (ref 26.0–34.0)
MCHC: 32.9 g/dL (ref 30.0–36.0)
MCV: 93.8 fL (ref 78.0–100.0)
MONOS PCT: 15 %
Monocytes Absolute: 0.8 10*3/uL (ref 0.1–1.0)
NEUTROS ABS: 3.2 10*3/uL (ref 1.7–7.7)
NEUTROS PCT: 60 %
Platelets: 197 10*3/uL (ref 150–400)
RBC: 3.89 MIL/uL (ref 3.87–5.11)
RDW: 14.5 % (ref 11.5–15.5)
WBC: 5.4 10*3/uL (ref 4.0–10.5)

## 2016-10-20 LAB — PROTIME-INR
INR: 1.89
PROTHROMBIN TIME: 22 s — AB (ref 11.4–15.2)

## 2016-10-20 LAB — TSH: TSH: 1.849 u[IU]/mL (ref 0.350–4.500)

## 2016-10-20 LAB — BASIC METABOLIC PANEL
ANION GAP: 7 (ref 5–15)
BUN: 10 mg/dL (ref 6–20)
CO2: 29 mmol/L (ref 22–32)
Calcium: 9.1 mg/dL (ref 8.9–10.3)
Chloride: 103 mmol/L (ref 101–111)
Creatinine, Ser: 0.74 mg/dL (ref 0.44–1.00)
Glucose, Bld: 130 mg/dL — ABNORMAL HIGH (ref 65–99)
POTASSIUM: 3.6 mmol/L (ref 3.5–5.1)
SODIUM: 139 mmol/L (ref 135–145)

## 2016-10-20 LAB — GLUCOSE, CAPILLARY
GLUCOSE-CAPILLARY: 106 mg/dL — AB (ref 65–99)
GLUCOSE-CAPILLARY: 109 mg/dL — AB (ref 65–99)
GLUCOSE-CAPILLARY: 96 mg/dL (ref 65–99)
Glucose-Capillary: 100 mg/dL — ABNORMAL HIGH (ref 65–99)
Glucose-Capillary: 120 mg/dL — ABNORMAL HIGH (ref 65–99)

## 2016-10-20 LAB — BRAIN NATRIURETIC PEPTIDE: B NATRIURETIC PEPTIDE 5: 89.3 pg/mL (ref 0.0–100.0)

## 2016-10-20 MED ORDER — ENOXAPARIN SODIUM 60 MG/0.6ML ~~LOC~~ SOLN
55.0000 mg | Freq: Two times a day (BID) | SUBCUTANEOUS | Status: DC
Start: 2016-10-20 — End: 2016-10-24
  Administered 2016-10-20 – 2016-10-24 (×10): 55 mg via SUBCUTANEOUS
  Filled 2016-10-20 (×10): qty 0.6

## 2016-10-20 MED ORDER — WARFARIN - PHARMACIST DOSING INPATIENT: Freq: Every day | Status: DC

## 2016-10-20 MED ORDER — ACETAMINOPHEN 325 MG PO TABS
650.0000 mg | ORAL_TABLET | ORAL | Status: DC | PRN
  Administered 2016-10-20 – 2016-10-22 (×5): 650 mg via ORAL
  Filled 2016-10-20 (×5): qty 2

## 2016-10-20 MED ORDER — INSULIN ASPART 100 UNIT/ML ~~LOC~~ SOLN
0.0000 [IU] | Freq: Three times a day (TID) | SUBCUTANEOUS | Status: DC
  Administered 2016-10-23 (×2): 1 [IU] via SUBCUTANEOUS

## 2016-10-20 MED ORDER — FUROSEMIDE 10 MG/ML IJ SOLN
20.0000 mg | Freq: Two times a day (BID) | INTRAMUSCULAR | Status: DC
  Administered 2016-10-20: 20 mg via INTRAVENOUS
  Filled 2016-10-20: qty 2

## 2016-10-20 MED ORDER — SODIUM CHLORIDE 0.9 % IV SOLN: 250.0000 mL | INTRAVENOUS | Status: DC | PRN

## 2016-10-20 MED ORDER — ATORVASTATIN CALCIUM 20 MG PO TABS
20.0000 mg | ORAL_TABLET | Freq: Every day | ORAL | Status: DC
  Administered 2016-10-20 – 2016-10-24 (×5): 20 mg via ORAL
  Filled 2016-10-20 (×5): qty 1

## 2016-10-20 MED ORDER — WARFARIN SODIUM 4 MG PO TABS
4.0000 mg | ORAL_TABLET | ORAL | Status: AC
Start: 2016-10-20 — End: 2016-10-20
  Administered 2016-10-20: 4 mg via ORAL
  Filled 2016-10-20: qty 1

## 2016-10-20 MED ORDER — SODIUM CHLORIDE 0.9% FLUSH
3.0000 mL | INTRAVENOUS | Status: DC | PRN
  Administered 2016-10-20: 3 mL via INTRAVENOUS
  Filled 2016-10-20: qty 3

## 2016-10-20 MED ORDER — INSULIN ASPART 100 UNIT/ML ~~LOC~~ SOLN: 0.0000 [IU] | Freq: Every day | SUBCUTANEOUS | Status: DC

## 2016-10-20 MED ORDER — WARFARIN SODIUM 3 MG PO TABS
3.0000 mg | ORAL_TABLET | Freq: Once | ORAL | Status: AC
Start: 2016-10-20 — End: 2016-10-20
  Administered 2016-10-20: 3 mg via ORAL
  Filled 2016-10-20 (×2): qty 1

## 2016-10-20 MED ORDER — SODIUM CHLORIDE 0.9% FLUSH
3.0000 mL | Freq: Two times a day (BID) | INTRAVENOUS | Status: DC
  Administered 2016-10-20 – 2016-10-24 (×10): 3 mL via INTRAVENOUS

## 2016-10-20 MED ORDER — AMLODIPINE BESYLATE 5 MG PO TABS
5.0000 mg | ORAL_TABLET | Freq: Every day | ORAL | Status: DC
  Administered 2016-10-20 – 2016-10-24 (×5): 5 mg via ORAL
  Filled 2016-10-20 (×5): qty 1

## 2016-10-20 MED ORDER — ONDANSETRON HCL 4 MG/2ML IJ SOLN: 4.0000 mg | Freq: Four times a day (QID) | INTRAMUSCULAR | Status: DC | PRN

## 2016-10-20 NOTE — Evaluation (Signed)
Occupational Therapy Evaluation Patient Details Name: Courtney Evans MRN: [id number] DOB: 1936-03-23 Today's Date: 10/20/2016    History of Present Illness 81 y.o. female with medical history significant of rheumatic heart disease, status post mechanical mitral and aortic valve, diastolic heart failure, hx of pulmonary artery hypertension,  osteoporosis, vertebral compression fractures (early April) and has had multiple kyphoplasty's, hyperlipidemia, hypertension, a.fib on coumadin and LBBB, DM 2, sp pacemaker. Pt admitted with back pain and hypoxia.    Clinical Impression   This 81 y/o F presents with the above. Pt lives alone and was previously independent with ADLs and some IADLs. Pt completed ADLs and functional mobility at [place] level this session with lowest O2 sat 89% on RA, no dyspnea noted. Pt will benefit from continued acute OT to increase activity tolerance, safety and independence with ADLs and functional mobility. Goals are for supervision-independent level.     Follow Up Recommendations  No OT follow up;Supervision - Intermittent    Equipment Recommendations  None recommended by OT           Precautions / Restrictions Precautions Precautions: Fall Precaution Comments: pt denies falls in past 1 year Restrictions Weight Bearing Restrictions: No      Mobility Bed Mobility Overal bed mobility: Needs Assistance Bed Mobility: Supine to Sit     Supine to sit: Min guard;HOB elevated     General bed mobility comments:   Transfers Overall transfer level: Needs assistance Equipment used: Rolling walker (2 wheeled) Transfers: Sit to/from Stand Sit to Stand: Min guard              Balance Overall balance assessment: No apparent balance deficits (not formally assessed)                                         ADL either performed or assessed with clinical judgement   ADL Overall ADL's : Needs assistance/impaired Eating/Feeding:  Independent;Sitting   Grooming: Wash/dry face;Oral care;Min guard;Standing   Upper Body Bathing: Min guard;Sitting   Lower Body Bathing: Min guard;Sit to/from stand   Upper Body Dressing : Supervision/safety;Sitting   Lower Body Dressing: Min guard;Bed level Lower Body Dressing Details (indicate cue type and reason): donning socks  Toilet Transfer: Min guard;Ambulation;Comfort height toilet;Grab bars;RW   Toileting- Water quality scientist and Hygiene: Min guard;Sit to/from stand       Functional mobility during ADLs: Min guard;Rolling walker General ADL Comments: Pt completed room level functional mobility, sitting and standing ADLs, lowest O2 sat 89% on RA during ADLs, returning to >90% upon return to sitting                          Pertinent Vitals/Pain Pain Assessment: Faces Pain Score: 4  Faces Pain Scale: Hurts a little bit Pain Location: back Pain Descriptors / Indicators: Sore Pain Intervention(s): Limited activity within patient's tolerance;Monitored during session     Hand Dominance     Extremity/Trunk Assessment Upper Extremity Assessment Upper Extremity Assessment: Overall WFL for tasks assessed   Lower Extremity Assessment Lower Extremity Assessment: Overall WFL for tasks assessed   Cervical / Trunk Assessment Cervical / Trunk Assessment: Kyphotic   Communication Communication Communication: No difficulties   Cognition Arousal/Alertness: Awake/alert Behavior During Therapy: WFL for tasks assessed/performed Overall Cognitive Status: Within Functional Limits for tasks assessed  General Comments                  Home Living Family/patient expects to be discharged to:: Private residence Living Arrangements: Alone Available Help at Discharge: Family;Available PRN/intermittently         Home Layout: One level     Bathroom Shower/Tub: Occupational psychologist: Handicapped  height     Home Equipment: Shower seat;Hand held shower head;Grab bars - tub/shower;Grab bars - toilet   Additional Comments: daughter checks in daily      Prior Functioning/Environment Level of Independence: Independent        Comments: drives, walks without AD, denies h/o falls, reports she completes her own laundry and grocery shopping        OT Problem List: Decreased activity tolerance;Pain      OT Treatment/Interventions: Self-care/ADL training;Energy conservation;DME and/or AE instruction;Patient/family education;Therapeutic activities    OT Goals(Current goals can be found in the care plan section) Acute Rehab OT Goals Patient Stated Goal: return to independence OT Goal Formulation: With patient Time For Goal Achievement: 10/27/16 Potential to Achieve Goals: Good ADL Goals Pt Will Perform Grooming: with supervision;standing Pt Will Perform Toileting - Clothing Manipulation and hygiene: with supervision;sit to/from stand Additional ADL Goal #1: Pt will independently demonstrate at least one energy conservation technique during ADL completion.  OT Frequency: Min 2X/week                             End of Session Equipment Utilized During Treatment: Gait belt;Rolling walker Nurse Communication: Other (comment) (nurse tech)  Activity Tolerance: Patient tolerated treatment well Patient left: in chair;with call bell/phone within reach;with chair alarm set  OT Visit Diagnosis: Muscle weakness (generalized) (M62.81)                Time: 9628-3662 OT Time Calculation (min): 22 min Charges:  OT General Charges $OT Visit: 1 Procedure OT Evaluation $OT Eval Low Complexity: 1 Procedure G-Codes:     Lou Cal, OT Pager [phone] 10/20/2016   Raymondo Band 10/20/2016, 1:55 PM

## 2016-10-20 NOTE — Progress Notes (Signed)
ANTICOAGULATION CONSULT NOTE - Initial Consult  Pharmacy Consult for Warfarin, enoxaparin (bridge until INR at goal) Indication: atrial fibrillation, mechanical valves (mitral, aortic)  (INR goal 2.5-3 per MD)  Allergies  Allergen Reactions  . Codeine Nausea And Vomiting    Patient Measurements: Height: 5\' 4"  (162.6 cm) Weight: 126 lb 5.2 oz (57.3 kg) IBW/kg (Calculated) : 54.7 Heparin Dosing Weight:   Vital Signs: Temp: 97.5 F (36.4 C) (04/26 0050) Temp Source: Oral (04/26 0050) BP: 158/66 (04/26 0050) Pulse Rate: 63 (04/26 0050)  Labs:  Recent Labs  10/17/16 1214 10/19/16 1825 10/19/16 2233 10/20/16 0045  HGB  --  13.0  --   --   HCT  --  39.4  --   --   PLT  --  207  --   --   LABPROT  --   --  22.6*  --   INR 2.2  --  1.96  --   CREATININE  --  0.82  --   --   TROPONINI  --   --  0.04* 0.04*    Estimated Creatinine Clearance: 48.8 mL/min (by C-G formula based on SCr of 0.82 mg/dL).   Medical History: Past Medical History:  Diagnosis Date  . A-fib ([place])   . Compression fracture   . Coronary artery disease   . Diabetes mellitus without complication ([place])   . Hyperlipidemia   . Hypertension   . Pacemaker     Medications:  Prescriptions Prior to Admission  Medication Sig Dispense Refill Last Dose  . acetaminophen (TYLENOL) 500 MG tablet Take 500-1,000 mg by mouth every 6 (six) hours as needed for mild pain, moderate pain, fever or headache.   10/19/2016 at 1100  . amLODipine (NORVASC) 5 MG tablet Take 5 mg by mouth daily.    10/19/2016 at Unknown time  . atorvastatin (LIPITOR) 20 MG tablet Take 20 mg by mouth daily.    10/19/2016 at Unknown time  . cholecalciferol (VITAMIN D) 1000 units tablet Take 2,000 Units by mouth daily.    10/19/2016 at Unknown time  . metFORMIN (GLUCOPHAGE) 500 MG tablet Take 500 mg by mouth 2 (two) times daily with a meal.    10/19/2016 at Unknown time  . nitroGLYCERIN (NITROSTAT) 0.4 MG SL tablet Place 0.4 mg under the tongue every  5 (five) minutes as needed for chest pain.   Past Month at Unknown time  . polyethylene glycol (MIRALAX / GLYCOLAX) packet Take 17 g by mouth daily as needed for mild constipation.   Past Week at Unknown time  . warfarin (COUMADIN) 3 MG tablet Take 1.5-3 mg by mouth every evening. Pt takes one-half tablet on Monday and one tablet all other days.   10/18/2016 at 1800  . enoxaparin (LOVENOX) 60 MG/0.6ML injection Inject 0.6 mLs (60 mg total) into the skin every 12 (twelve) hours. (Patient not taking: Reported on 10/19/2016) 20 Syringe 0 Not Taking at Unknown time   Scheduled:  . amLODipine  5 mg Oral Daily  . atorvastatin  20 mg Oral Daily  . enoxaparin (LOVENOX) injection  55 mg Subcutaneous BID  . furosemide  20 mg Intravenous BID  . insulin aspart  0-5 Units Subcutaneous QHS  . insulin aspart  0-9 Units Subcutaneous TID WC  . iopamidol      . sodium chloride flush  3 mL Intravenous Q12H  . Warfarin - Pharmacist Dosing Inpatient   Does not apply q1800    Assessment: Patient with history of afib and mechanical  valves.  INR noted to be 2.5-3.   Patient with recent hematoma and kyphoplasty.  INR < 2 on admit.  MD note hematoma stable.  Enoxaparin until INR at goal, per MD note.  Goal of Therapy:  INR 2.5-3 Monitor platelets by anticoagulation protocol: Yes   Plan:  Enoxaparin 55mg  sq q12hr Warfarin 4mg  po x1 (given) Daily INR  Nani Skillern Crowford 10/20/2016,2:22 AM

## 2016-10-20 NOTE — Progress Notes (Signed)
PROGRESS NOTE    Chanette Demo  JSH:[id number] DOB: October 15, 1935 DOA: 10/19/2016 PCP: Cari Caraway, MD    Brief Narrative:  81 y.o. female with medical history significant of rheumatic heart disease, status post mechanical mitral and aortic valve, diastolic heart failure, hx of pulmonary artery hypertension   osteoporosis vertebral compression fractions and has had multiple kyphoplasty's, hyperlipidemia, hypertension, a.fib on coumadin and LBBB, DM 2, sp pacemaker  Presented with right mid to lower back pain started for 3-4 weeks she states she was ignoring the pain but finally decided to get it checked out so she called 911. No recent injury or fall, no recent worsening of the pain. It has been steady. She was given fentanyl patient was noted to be hypoxic down to 85% on RA does not have a history of hypoxia . Patient had not had any shortness of breath or chest pain but does hurt to take a deep breath in her back. When patient ambulated oxygen saturation dropped to 83%. Denies shortness of breath no chest pain her hematoma of the left breast have been going down. denies dyspnea on exertion. She denies weight gain.  NO cough no wheezing no sick contacts.  In the beginning of April she had a fall resulting in compression fractures and was undergone kyphoplasty on 16th of April her Coumadin was held was bridged with Lovenox.   March 2018 patient was admitted with bleeding from a site of breast biopsy was found to have hemoglobin of 9 and INR 3 and recheck her hemoglobin dropped down to 6 she was admitted and transfused 2 units felt to have possible infection of breath hematoma and treated with antibiotic coverage discharge on clindamycin  Assessment & Plan:   Active Problems:   Chronic atrial fibrillation (HCC)   Mechanical heart valve present   LBBB (left bundle branch block)   Back pain   Hypoxia   Type 2 diabetes mellitus without complication (HCC)   Acute on chronic diastolic CHF  (congestive heart failure) (HCC)   Breast hematoma   Subtherapeutic international normalized ratio (INR)  . Back pain - suspected musculoskeletal in etiology - Presently stable. Continue analgesics as tolerated  . Hypoxia, uncertain etiology - Initially suspected volume overload and given lasix overnight, however patient seems clinically euvolemic and with normal BNP, thus doubt heart failure - 2d echo is pending - Cardiology consulted and is following. Recs to f/u on 2d echo and to continue lovenox bridge until coumadin is therapeutic - Patient ambulated with PT on room air without difficulty and without hypoxia  . Chronic diastolic CHF (congestive heart failure) ([place])  - Per above, doubt acute heart failure - Have discontinued IV lasix  . Breast hematoma  - Noted to be stable  - Without evidence of infectious process  DM 2   - Continue with SSI coverage  . Chronic atrial fibrillation (HCC) -  - stable and rate controlled        - CHA2DS2 vas score 4, thus will continue current anticoagulation with  Coumadin per pharmacy,    Hx of mitral and aortic mechanical valves - INR noted to be subtherapeutic - On Lovenox bridge until INR is therapeutic  . LBBB (left bundle branch block)  - Noted to be chronic  DVT prophylaxis: Lovenox bridge, coumadin Code Status: Full Family Communication: Pt in room, family not at bedside Disposition Plan: Uncertain at this time  Consultants:   Cardiology  Procedures:     Antimicrobials: Anti-infectives    None  Subjective: Eager to go home  Objective: Vitals:   10/20/16 0050 10/20/16 0441 10/20/16 1417 10/20/16 1425  BP: (!) 158/66 135/61 140/61 138/63  Pulse: 63 (!) 57 69 72  Resp: 20 20 18 18   Temp: 97.5 F (36.4 C) 98.1 F (36.7 C) 97.3 F (36.3 C) 97.8 F (36.6 C)  TempSrc: Oral Oral Oral Oral  SpO2: 100% 98% 95% 97%  Weight:      Height:        Intake/Output Summary (Last 24 hours) at 10/20/16 1546 Last  data filed at 10/20/16 1517  Gross per 24 hour  Intake              720 ml  Output             1120 ml  Net             -400 ml   Filed Weights   10/19/16 1734 10/20/16 0036  Weight: 59 kg (130 lb) 57.3 kg (126 lb 5.2 oz)    Examination:  General exam: Appears calm and comfortable  Respiratory system: Clear to auscultation. Respiratory effort normal. Cardiovascular system: S1 & S2 heard, RRR.  Gastrointestinal system: Abdomen is nondistended, soft and nontender. No organomegaly or masses felt. Normal bowel sounds heard. Central nervous system: Alert and oriented. No focal neurological deficits. Extremities: Symmetric 5 x 5 power. Skin: No rashes, lesions Psychiatry: Judgement and insight appear normal. Mood & affect appropriate.   Data Reviewed: I have personally reviewed following labs and imaging studies  CBC:  Recent Labs Lab 10/19/16 1825 10/20/16 0334  WBC 6.4 5.4  NEUTROABS 4.5 3.2  HGB 13.0 12.0  HCT 39.4 36.5  MCV 96.1 93.8  PLT 207 341   Basic Metabolic Panel:  Recent Labs Lab 10/19/16 1825 10/20/16 0334  NA 138 139  K 4.3 3.6  CL 103 103  CO2 27 29  GLUCOSE 146* 130*  BUN 11 10  CREATININE 0.82 0.74  CALCIUM 9.2 9.1   GFR: Estimated Creatinine Clearance: 50 mL/min (by C-G formula based on SCr of 0.74 mg/dL). Liver Function Tests:  Recent Labs Lab 10/19/16 1825  AST 26  ALT 16  ALKPHOS 85  BILITOT 0.7  PROT 7.0  ALBUMIN 4.0   No results for input(s): LIPASE, AMYLASE in the last 168 hours. No results for input(s): AMMONIA in the last 168 hours. Coagulation Profile:  Recent Labs Lab 10/17/16 1214 10/19/16 2233 10/20/16 0334  INR 2.2 1.96 1.89   Cardiac Enzymes:  Recent Labs Lab 10/20/16 0045 10/20/16 0334 10/20/16 0650 10/20/16 1022 10/20/16 1333  TROPONINI 0.04* 0.04* 0.04* 0.03* 0.03*   BNP (last 3 results) No results for input(s): PROBNP in the last 8760 hours. HbA1C: No results for input(s): HGBA1C in the last 72  hours. CBG:  Recent Labs Lab 10/20/16 0050 10/20/16 0804 10/20/16 1144  GLUCAP 96 106* 109*   Lipid Profile: No results for input(s): CHOL, HDL, LDLCALC, TRIG, CHOLHDL, LDLDIRECT in the last 72 hours. Thyroid Function Tests:  Recent Labs  10/20/16 0334  TSH 1.849   Anemia Panel: No results for input(s): VITAMINB12, FOLATE, FERRITIN, TIBC, IRON, RETICCTPCT in the last 72 hours. Sepsis Labs: No results for input(s): PROCALCITON, LATICACIDVEN in the last 168 hours.  No results found for this or any previous visit (from the past 240 hour(s)).   Radiology Studies: Dg Chest 2 View  Result Date: 10/19/2016 CLINICAL DATA:  Initial evaluation for acute mid to lower back pain. EXAM: CHEST  2  VIEW COMPARISON:  Prior radiograph from 09/17/2016. FINDINGS: Single E left-sided transvenous pacemaker/ AICD, unchanged. Median sternotomy wires noted. Stable cardiomegaly. Mediastinal silhouette grossly within normal limits. Lungs hypoinflated. Mild diffuse pulmonary pulmonary vascular congestion without frank pulmonary edema. Probable superimposed bibasilar atelectasis. No other focal infiltrates. Sequela prior vertebral augmentation seen at multiple levels within the thoracolumbar spine. Scattered compression deformities are grossly stable. No acute osseus abnormality. IMPRESSION: 1. Shallow lung inflation with associated bibasilar atelectasis. No other active cardiopulmonary disease. 2. Stable cardiomegaly with mild perihilar pulmonary vascular congestion without overt pulmonary edema. Electronically Signed   By: Jeannine Boga M.D.   On: 10/19/2016 19:11   Dg Thoracic Spine 2 View  Result Date: 10/19/2016 CLINICAL DATA:  Initial evaluation for acute right mid to lower back pain. Status post recent vertebral augmentation. EXAM: THORACIC SPINE 2 VIEWS COMPARISON:  Prior radiograph from 09/28/2016. FINDINGS: Stable alignment with exaggeration of the normal thoracic kyphosis. Sequelae of prior  vertebral augmentation seen at multiple levels within the lower thoracic and upper lumbar spine. No complication identified. Overall, scattered compression deformities are stable from prior with maintained vertebral body height. No acute osseous abnormality. No soft tissue abnormality. IMPRESSION: 1. No acute abnormality identified within the thoracic spine. 2. Stable appearance of multilevel compression deformities involving the mid and lower thoracic spine with sequelae of prior vertebral augmentation. Electronically Signed   By: Jeannine Boga M.D.   On: 10/19/2016 19:15   Ct Angio Chest Pe W And/or Wo Contrast  Result Date: 10/19/2016 CLINICAL DATA:  Hypoxia.  Recent lumbar spine surgery. EXAM: CT ANGIOGRAPHY CHEST WITH CONTRAST TECHNIQUE: Multidetector CT imaging of the chest was performed using the standard protocol during bolus administration of intravenous contrast. Multiplanar CT image reconstructions and MIPs were obtained to evaluate the vascular anatomy. CONTRAST:  100 mL Isovue 370 COMPARISON:  Thoracic spine radiograph and two-view chest x-ray from the same. CT chest 08/26/2016 FINDINGS: Cardiovascular: The heart is enlarged. Bilateral atrial enlargement is noted. Atherosclerotic changes are noted in the aorta without aneurysm or definite stenosis. Pulmonary arterial opacification is satisfactory. There are no significant focal filling defects suggest pulmonary embolus. Mediastinum/Nodes: No significant mediastinal or axillary adenopathy is present. Lungs/Pleura: Bilateral pleural effusions are noted, left greater than right. Mild dependent atelectasis is present. There is no significant airspace consolidation. No nodule or mass lesion is present. Upper Abdomen: Within normal limits. Musculoskeletal: Spinal augmentation is again noted T9, T10, T11. The T4 vertebral body height is preserved. Fractures are present through the remaining thoracic vertebral bodies. There are not significantly  changed since March 2nd. Exaggerated thoracic kyphosis is associated. A hypoplastic left fourth rib is noted. Review of the MIP images confirms the above findings. IMPRESSION: 1. No evidence for pulmonary embolus. 2. Cardiomegaly without failure. 3. Median sternotomy for CABG. 4. Small bilateral pleural effusions, left greater than right with associated atelectasis. Electronically Signed   By: San Morelle M.D.   On: 10/19/2016 20:35    Scheduled Meds: . amLODipine  5 mg Oral Daily  . atorvastatin  20 mg Oral Daily  . enoxaparin (LOVENOX) injection  55 mg Subcutaneous BID  . insulin aspart  0-5 Units Subcutaneous QHS  . insulin aspart  0-9 Units Subcutaneous TID WC  . sodium chloride flush  3 mL Intravenous Q12H  . warfarin  3 mg Oral ONCE-1800  . Warfarin - Pharmacist Dosing Inpatient   Does not apply q1800   Continuous Infusions: . sodium chloride       LOS: 1  day   Shaul Trautman, Orpah Melter, MD [facility] Pager [phone]  If 7PM-7AM, please contact night-coverage [email or web address] Password [facility] 10/20/2016, 3:46 PM

## 2016-10-20 NOTE — Evaluation (Signed)
Physical Therapy Evaluation Patient Details Name: Courtney Evans MRN: [id number] DOB: 1935-11-10 Today's Date: 10/20/2016   History of Present Illness  81 y.o. female with medical history significant of rheumatic heart disease, status post mechanical mitral and aortic valve, diastolic heart failure, hx of pulmonary artery hypertension,  osteoporosis, vertebral compression fractures (early April) and has had multiple kyphoplasty's, hyperlipidemia, hypertension, a.fib on coumadin and LBBB, DM 2, sp pacemaker. Pt admitted with back pain and hypoxia.   Clinical Impression  Pt is independent with mobility, she ambulated 240' without an assistive device, no loss of balance, SaO2 92-97% on RA walking. From PT standpoint, she is ready to DC home, no follow up PT needed. PT signing off.     Follow Up Recommendations No PT follow up    Equipment Recommendations  None recommended by PT    Recommendations for Other Services OT consult     Precautions / Restrictions Precautions Precautions: Fall Precaution Comments: pt denies falls in past 1 year Restrictions Weight Bearing Restrictions: No      Mobility  Bed Mobility               General bed mobility comments: up in recliner  Transfers Overall transfer level: Independent Equipment used: None                Ambulation/Gait Ambulation/Gait assistance: Independent Ambulation Distance (Feet): 240 Feet Assistive device: None Gait Pattern/deviations: WFL(Within Functional Limits)   Gait velocity interpretation: at or above normal speed for age/gender General Gait Details: steady, no LOB, kyphotic posture, SaO2 92-97% on RA, no dyspnea noted  Stairs            Wheelchair Mobility    Modified Rankin (Stroke Patients Only)       Balance Overall balance assessment: No apparent balance deficits (not formally assessed)                                           Pertinent Vitals/Pain Pain  Assessment: Faces Pain Score: 4  Faces Pain Scale: Hurts a little bit Pain Location: back Pain Descriptors / Indicators: Sore Pain Intervention(s): Limited activity within patient's tolerance;Monitored during session    Home Living Family/patient expects to be discharged to:: Private residence Living Arrangements: Alone Available Help at Discharge: Family;Available PRN/intermittently         Home Layout: One level Home Equipment: Shower seat;Hand held shower head;Grab bars - tub/shower;Grab bars - toilet Additional Comments: daughter checks in daily    Prior Function Level of Independence: Independent;         Comments: drives, walks without AD, denies h/o falls, reports she completes her own laundry and grocery shopping     Hand Dominance        Extremity/Trunk Assessment   Upper Extremity Assessment Upper Extremity Assessment: Overall WFL for tasks assessed    Lower Extremity Assessment Lower Extremity Assessment: Overall WFL for tasks assessed    Cervical / Trunk Assessment Cervical / Trunk Assessment: Kyphotic  Communication   Communication: No difficulties  Cognition Arousal/Alertness: Awake/alert Behavior During Therapy: WFL for tasks assessed/performed Overall Cognitive Status: Within Functional Limits for tasks assessed                                        General Comments  Exercises     Assessment/Plan    PT Assessment Patent does not need any further PT services  PT Problem List         PT Treatment Interventions      PT Goals (Current goals can be found in the Care Plan section)  Acute Rehab PT Goals Patient Stated Goal: return to independence PT Goal Formulation: All assessment and education complete, DC therapy    Frequency     Barriers to discharge        Co-evaluation               End of Session Equipment Utilized During Treatment: Gait belt Activity Tolerance: Patient tolerated treatment  well;No increased pain Patient left: in chair;with call bell/phone within reach;with chair alarm set Nurse Communication: Mobility status      Time: 0737-1062 PT Time Calculation (min) (ACUTE ONLY): 14 min   Charges:   PT Evaluation $PT Eval Low Complexity: 1 Procedure     PT G Codes:          [name] 10/20/2016, 1:45 PM [phone]

## 2016-10-20 NOTE — Progress Notes (Signed)
ANTICOAGULATION CONSULT NOTE - Follow Up Consult  Pharmacy Consult for Warfarin, enoxaparin (bridge until INR at goal) Indication: atrial fibrillation, mechanical valves (mitral, aortic)  (INR goal 2.5-3 per MD)  Allergies  Allergen Reactions  . Codeine Nausea And Vomiting    Patient Measurements: Height: 5\' 4"  (162.6 cm) Weight: 126 lb 5.2 oz (57.3 kg) IBW/kg (Calculated) : 54.7  Vital Signs: Temp: 98.1 F (36.7 C) (04/26 0441) Temp Source: Oral (04/26 0441) BP: 135/61 (04/26 0441) Pulse Rate: 57 (04/26 0441)  Labs:  Recent Labs  10/17/16 1214 10/19/16 1825 10/19/16 2233  10/20/16 0334 10/20/16 0650 10/20/16 1022  HGB  --  13.0  --   --  12.0  --   --   HCT  --  39.4  --   --  36.5  --   --   PLT  --  207  --   --  197  --   --   LABPROT  --   --  22.6*  --  22.0*  --   --   INR 2.2  --  1.96  --  1.89  --   --   CREATININE  --  0.82  --   --  0.74  --   --   TROPONINI  --   --  0.04*  < > 0.04* 0.04* 0.03*  < > = values in this interval not displayed.  Estimated Creatinine Clearance: 50 mL/min (by C-G formula based on SCr of 0.74 mg/dL).   Medical History: Past Medical History:  Diagnosis Date  . A-fib ([place])   . Compression fracture   . Coronary artery disease   . Diabetes mellitus without complication ([place])   . Hyperlipidemia   . Hypertension   . Pacemaker     Assessment: 45 yoF with Afib and Hx rheumatic heart disease with MVR and AVR on warfarin, HTN, DM, dCHF, admitted with back pain.  INR noted to be 2.5-3.   Patient with recent hematoma and kyphoplasty, for which she was bridged with Lovenox.  INR < 2 on admit.  MD note hematoma stable.  Enoxaparin until INR at goal, per MD note.  PTA warfarin dose reported as 3 mg daily except takes 1.5 mg on Mondays; INR was therapeutic at 2.2 at anti-coag clinic visit on 4/23  Today, 10/20/2016: - INR 1.89, given 4 mg of warfarin at ~1am today - Hgb, platelets WNL/stable - CrCl ~50 ml/min - No significant  drug interactions with warfarin noted   Goal of Therapy:  INR 2.5-3 Monitor platelets by anticoagulation protocol: Yes   Plan:  Continue enoxaparin 55mg  SQ q12hr Warfarin 3 mg at 1800 Daily INR  Courtney Evans 10/20/2016,11:42 AM

## 2016-10-20 NOTE — Consult Note (Signed)
Cardiology Consult    Patient ID: Courtney Evans MRN: [id number], DOB/AGE: 1936/01/21   Admit date: 10/19/2016 Date of Consult: 10/20/2016  Primary Physician: Cari Caraway, MD Primary Cardiologist: Dr. Sallyanne Kuster Requesting Provider: Dr. Wyline Copas   Reason for Consult: CHF  Patient Profile    Courtney Evans is a 81 yo female who has a PMH significant for rheumatic heart heart disease, chronic diastolic heart failure s/p mechanical AVR and mechanical MVR (1994, [facility]), 1998 received single-chamber permanent pacemaker, generator change out in 2013 ([employer]), HTN, HLD, DM, and hx of pulmonary hypertension. She presented to [facility] with back pain.  Courtney Evans is a 81 y.o. female who is being seen today for the evaluation of acute on chronic diastolic heart failure at the request of Dr. Wyline Copas.   Past Medical History   Past Medical History:  Diagnosis Date  . A-fib ([place])   . Compression fracture   . Coronary artery disease   . Diabetes mellitus without complication ([place])   . Hyperlipidemia   . Hypertension   . Pacemaker     Past Surgical History:  Procedure Laterality Date  . AORTIC AND MITRAL VALVE REPLACEMENT  1994   w/ mechanical valves at [facility]  . BACK SURGERY    . CARDIAC SURGERY    . KYPHOPLASTY  2011   1 - at [facility]  . KYPHOPLASTY  2012   2 - at [facility]  . KYPHOPLASTY  2013   1 - at [facility]  . KYPHOPLASTY  05/2015   1 - at [place]  . PACEMAKER GENERATOR CHANGE  2013  . [place]     Allergies  Allergies  Allergen Reactions  . Codeine Nausea And Vomiting    History of Present Illness    Courtney Evans is known to our service and last saw Dr. Sallyanne Kuster on 02/19/16. At that time, she was euvolemic on exam, in her usual state of health, and her rhythm was Afib with 100% ventricular pacing at 80 bpm; previous EKGs  showed Afib with LBBB. She is generally in rate-controlled Afib on coumadin. Per Dr. Victorino December clinic note, echo in 2016 with normal valvular function (mechanical AVR and mechanical MVR) and diastolic dysfunction. Per Dr. Victorino December clinic note, he suspected PPM placed for intermittent high-grade AV block.   Courtney. Evans came to the [facility] with back pain. She was admitted and cardiology consulted given her complex cardiac history. She denies SOB, chest pain, DOE, orthopnea, extremity swelling, headache, dizziness, N/V, and pre/syncope. She appears euvolemic on exam. She is compliant on her home medications, including coumadin for her Afib and mechanical valves. She lives at home alone, but her daughter lives nearby.  Of note, she has left breast swelling s/p recent breast biopsy. PPM within swollen tissue, but appears intact.  Inpatient Medications    . amLODipine  5 mg Oral Daily  . atorvastatin  20 mg Oral Daily  . enoxaparin (LOVENOX) injection  55 mg Subcutaneous BID  . furosemide  20 mg Intravenous BID  . insulin aspart  0-5 Units Subcutaneous QHS  . insulin aspart  0-9 Units Subcutaneous TID WC  . iopamidol      . sodium chloride flush  3 mL Intravenous Q12H  . Warfarin - Pharmacist Dosing Inpatient   Does not apply q1800     Outpatient Medications  Prior to Admission medications   Medication Sig Start Date End Date Taking? Authorizing Provider  acetaminophen (TYLENOL) 500 MG tablet Take 500-1,000 mg by mouth every 6 (six) hours as needed for mild pain, moderate pain, fever or headache.   Yes Historical Provider, MD  amLODipine (NORVASC) 5 MG tablet Take 5 mg by mouth daily.    Yes Historical Provider, MD  atorvastatin (LIPITOR) 20 MG tablet Take 20 mg by mouth daily.    Yes Historical Provider, MD  cholecalciferol (VITAMIN D) 1000 units tablet Take 2,000 Units by mouth daily.    Yes Historical Provider, MD  metFORMIN (GLUCOPHAGE) 500 MG tablet Take 500 mg by mouth 2 (two) times daily  with a meal.    Yes Historical Provider, MD  nitroGLYCERIN (NITROSTAT) 0.4 MG SL tablet Place 0.4 mg under the tongue every 5 (five) minutes as needed for chest pain.   Yes Historical Provider, MD  polyethylene glycol (MIRALAX / GLYCOLAX) packet Take 17 g by mouth daily as needed for mild constipation.   Yes Historical Provider, MD  warfarin (COUMADIN) 3 MG tablet Take 1.5-3 mg by mouth every evening. Pt takes one-half tablet on Monday and one tablet all other days.   Yes Historical Provider, MD  enoxaparin (LOVENOX) 60 MG/0.6ML injection Inject 0.6 mLs (60 mg total) into the skin every 12 (twelve) hours. Patient not taking: Reported on 10/19/2016 10/03/16   Sanda Klein, MD     Family History    Family History  Problem Relation Age of Onset  . Heart attack Mother   . Stroke Father     Social History    Social History   Social History  . Marital status: Married    Spouse name: N/A  . Number of children: N/A  . Years of education: N/A   Occupational History  . Not on file.   Social History Main Topics  . Smoking status: Never Smoker  . Smokeless tobacco: Never Used  . Alcohol use No  . Drug use: No  . Sexual activity: No   Other Topics Concern  . Not on file   Social History Narrative   Epworth Sleepiness Scale = 3 (as of 02/19/2016)     Review of Systems    General:  No chills, fever, night sweats or weight changes. + back pain Cardiovascular:  No chest pain, dyspnea on exertion, edema, orthopnea, palpitations, paroxysmal nocturnal dyspnea. Dermatological: No rash, lesions/masses Respiratory: No cough, dyspnea Urologic: No hematuria, dysuria Abdominal:   No nausea, vomiting, diarrhea, bright red blood per rectum, melena, or hematemesis Neurologic:  No visual changes, wkns, changes in mental status. All other systems reviewed and are otherwise negative except as noted above.  Physical Exam    Blood pressure 135/61, pulse (!) 57, temperature 98.1 F (36.7 C),  temperature source Oral, resp. rate 20, height 5\' 4"  (1.626 m), weight 126 lb 5.2 oz (57.3 kg), SpO2 98 %.  General: Pleasant, NAD Psych: Normal affect. Neuro: Alert and oriented X 3. Moves all extremities spontaneously. HEENT: Normal  Neck: Supple without bruits or JVD. Lungs:  Resp regular and unlabored, CTA. Heart: regular rhythm and rate, mechanical valve click appreciated Abdomen: Soft, non-tender, non-distended, BS + x 4.  Extremities: No clubbing, cyanosis or edema. DP/PT/Radials 1+ and equal bilaterally.  Labs    Troponin (Point of Care Test) No results for input(s): TROPIPOC in the last 72 hours.  Recent Labs  10/19/16 2233 10/20/16 0045 10/20/16 0334  TROPONINI 0.04* 0.04* 0.04*  Lab Results  Component Value Date   WBC 5.4 10/20/2016   HGB 12.0 10/20/2016   HCT 36.5 10/20/2016   MCV 93.8 10/20/2016   PLT 197 10/20/2016    Recent Labs Lab 10/19/16 1825 10/20/16 0334  NA 138 139  K 4.3 3.6  CL 103 103  CO2 27 29  BUN 11 10  CREATININE 0.82 0.74  CALCIUM 9.2 9.1  PROT 7.0  --   BILITOT 0.7  --   ALKPHOS 85  --   ALT 16  --   AST 26  --   GLUCOSE 146* 130*   No results found for: CHOL, HDL, LDLCALC, TRIG No results found for: [facility]   Radiology Studies    Dg Chest 2 View  Result Date: 10/19/2016 CLINICAL DATA:  Initial evaluation for acute mid to lower back pain. EXAM: CHEST  2 VIEW COMPARISON:  Prior radiograph from 09/17/2016. FINDINGS: Single E left-sided transvenous pacemaker/ AICD, unchanged. Median sternotomy wires noted. Stable cardiomegaly. Mediastinal silhouette grossly within normal limits. Lungs hypoinflated. Mild diffuse pulmonary pulmonary vascular congestion without frank pulmonary edema. Probable superimposed bibasilar atelectasis. No other focal infiltrates. Sequela prior vertebral augmentation seen at multiple levels within the thoracolumbar spine. Scattered compression deformities are grossly stable. No acute osseus abnormality.  IMPRESSION: 1. Shallow lung inflation with associated bibasilar atelectasis. No other active cardiopulmonary disease. 2. Stable cardiomegaly with mild perihilar pulmonary vascular congestion without overt pulmonary edema. Electronically Signed   By: Jeannine Boga M.D.   On: 10/19/2016 19:11   Dg Thoracic Spine 2 View  Result Date: 10/19/2016 CLINICAL DATA:  Initial evaluation for acute right mid to lower back pain. Status post recent vertebral augmentation. EXAM: THORACIC SPINE 2 VIEWS COMPARISON:  Prior radiograph from 09/28/2016. FINDINGS: Stable alignment with exaggeration of the normal thoracic kyphosis. Sequelae of prior vertebral augmentation seen at multiple levels within the lower thoracic and upper lumbar spine. No complication identified. Overall, scattered compression deformities are stable from prior with maintained vertebral body height. No acute osseous abnormality. No soft tissue abnormality. IMPRESSION: 1. No acute abnormality identified within the thoracic spine. 2. Stable appearance of multilevel compression deformities involving the mid and lower thoracic spine with sequelae of prior vertebral augmentation. Electronically Signed   By: Jeannine Boga M.D.   On: 10/19/2016 19:15   Ct Angio Chest Pe W And/or Wo Contrast  Result Date: 10/19/2016 CLINICAL DATA:  Hypoxia.  Recent lumbar spine surgery. EXAM: CT ANGIOGRAPHY CHEST WITH CONTRAST TECHNIQUE: Multidetector CT imaging of the chest was performed using the standard protocol during bolus administration of intravenous contrast. Multiplanar CT image reconstructions and MIPs were obtained to evaluate the vascular anatomy. CONTRAST:  100 mL Isovue 370 COMPARISON:  Thoracic spine radiograph and two-view chest x-ray from the same. CT chest 08/26/2016 FINDINGS: Cardiovascular: The heart is enlarged. Bilateral atrial enlargement is noted. Atherosclerotic changes are noted in the aorta without aneurysm or definite stenosis. Pulmonary  arterial opacification is satisfactory. There are no significant focal filling defects suggest pulmonary embolus. Mediastinum/Nodes: No significant mediastinal or axillary adenopathy is present. Lungs/Pleura: Bilateral pleural effusions are noted, left greater than right. Mild dependent atelectasis is present. There is no significant airspace consolidation. No nodule or mass lesion is present. Upper Abdomen: Within normal limits. Musculoskeletal: Spinal augmentation is again noted T9, T10, T11. The T4 vertebral body height is preserved. Fractures are present through the remaining thoracic vertebral bodies. There are not significantly changed since March 2nd. Exaggerated thoracic kyphosis is associated. A hypoplastic left fourth  rib is noted. Review of the MIP images confirms the above findings. IMPRESSION: 1. No evidence for pulmonary embolus. 2. Cardiomegaly without failure. 3. Median sternotomy for CABG. 4. Small bilateral pleural effusions, left greater than right with associated atelectasis. Electronically Signed   By: San Morelle M.D.   On: 10/19/2016 20:35    ECG & Cardiac Imaging    EKG 10/19/16: Afib LBBB, ventricular rate 62 bpm  Echocardiogram 10/20/16: pending  Assessment & Plan    1. Chronic diastolic heart failure - euvolemic at last clinic visit Aug 2017 with a weight of 131 lbs - she is noted to maintain euvolemic status without a home loop diuretic regimen - wt on admission was 130 lbs, and is 126 lbs today following lasix 20 mg IV BID - echocardiogram pending Courtney Evans appears euvolemic on exam.  She is under her dry weight. BNP was normal (89) on admission.  Echocardiogram to evaluate valvular function and diastolic dysfunction. Would continue AC for mechanical valves and Afib. She is asymptomatic from a heart failure standpoint. Recommend strict I&Os. May want to hold diuretics at this time. Will discuss with attending.   2. Atrial fibrillation - rate controlled in the  60-70s with ventricular pacing - continue coumadin - INR 1.89 (1.96), bridging with lovenox   3. HTN - continue norvasc   4. HLD - continue lipitor   5. DM - per primary team    Signed, Ledora Bottcher, PA-C 10/20/2016, 7:29 AM [phone]

## 2016-10-20 NOTE — Care Management Note (Signed)
Case Management Note  Patient Details  Name: Courtney Evans MRN: [id number] Date of Birth: October 21, 1935  Subjective/Objective: 81 y/o f admitted w/chronic afib. Hx: MVR/AVR. From home. PT cons-await recc.                   Action/Plan:d/c plan home.   Expected Discharge Date:                  Expected Discharge Plan:  Home/Self Care  In-House Referral:     Discharge planning Services  CM Consult  Post Acute Care Choice:    Choice offered to:     DME Arranged:    DME Agency:     HH Arranged:    HH Agency:     Status of Service:  In process, will continue to follow  If discussed at Long Length of Stay Meetings, dates discussed:    Additional Comments:  Dessa Phi, RN 10/20/2016, 1:12 PM

## 2016-10-21 ENCOUNTER — Inpatient Hospital Stay (HOSPITAL_COMMUNITY): Payer: Medicare Other

## 2016-10-21 DIAGNOSIS — I509 Heart failure, unspecified: Secondary | ICD-10-CM

## 2016-10-21 LAB — GLUCOSE, CAPILLARY
GLUCOSE-CAPILLARY: 120 mg/dL — AB (ref 65–99)
Glucose-Capillary: 114 mg/dL — ABNORMAL HIGH (ref 65–99)
Glucose-Capillary: 95 mg/dL (ref 65–99)
Glucose-Capillary: 131 mg/dL — ABNORMAL HIGH (ref 65–99)

## 2016-10-21 LAB — HEMOGLOBIN A1C
HEMOGLOBIN A1C: 5.2 % (ref 4.8–5.6)
Mean Plasma Glucose: 103 mg/dL

## 2016-10-21 LAB — ECHOCARDIOGRAM COMPLETE
Height: 64 in
Weight: 2042.34 oz

## 2016-10-21 LAB — BASIC METABOLIC PANEL
Anion gap: 10 (ref 5–15)
BUN: 14 mg/dL (ref 6–20)
CALCIUM: 9.3 mg/dL (ref 8.9–10.3)
CHLORIDE: 103 mmol/L (ref 101–111)
CO2: 27 mmol/L (ref 22–32)
CREATININE: 0.77 mg/dL (ref 0.44–1.00)
GFR calc non Af Amer: >60 mL/min (ref >60)
Glucose, Bld: 104 mg/dL — ABNORMAL HIGH (ref 65–99)
Potassium: 3.9 mmol/L (ref 3.5–5.1)
SODIUM: 140 mmol/L (ref 135–145)

## 2016-10-21 LAB — PROTIME-INR
INR: 2.36
Prothrombin Time: 26.2 seconds — ABNORMAL HIGH (ref 11.4–15.2)

## 2016-10-21 MED ORDER — WARFARIN SODIUM 3 MG PO TABS
3.0000 mg | ORAL_TABLET | Freq: Once | ORAL | Status: AC
Start: 2016-10-21 — End: 2016-10-21
  Administered 2016-10-21: 3 mg via ORAL
  Filled 2016-10-21: qty 1

## 2016-10-21 NOTE — Progress Notes (Signed)
  Echocardiogram 2D Echocardiogram has been performed.  Courtney Evans, Courtney Evans 10/21/2016, 10:55 AM

## 2016-10-21 NOTE — Care Management Note (Signed)
Case Management Note  Patient Details  Name: Courtney Evans MRN: [id number] Date of Birth: May 28, 1936  Subjective/Objective:PT/OT-no f/u needs. No CM needs.                    Action/Plan:d/c home.   Expected Discharge Date:                  Expected Discharge Plan:  Home/Self Care  In-House Referral:     Discharge planning Services  CM Consult  Post Acute Care Choice:    Choice offered to:     DME Arranged:    DME Agency:     HH Arranged:    HH Agency:     Status of Service:  In process, will continue to follow  If discussed at Long Length of Stay Meetings, dates discussed:    Additional Comments:  Dessa Phi, RN 10/21/2016, 12:32 PM

## 2016-10-21 NOTE — Progress Notes (Signed)
PROGRESS NOTE    Courtney Evans  WGN:[id number] DOB: 03-04-1936 DOA: 10/19/2016 PCP: Cari Caraway, MD    Brief Narrative:  81 y.o. female with medical history significant of rheumatic heart disease, status post mechanical mitral and aortic valve, diastolic heart failure, hx of pulmonary artery hypertension   osteoporosis vertebral compression fractions and has had multiple kyphoplasty's, hyperlipidemia, hypertension, a.fib on coumadin and LBBB, DM 2, sp pacemaker  Presented with right mid to lower back pain started for 3-4 weeks she states she was ignoring the pain but finally decided to get it checked out so she called 911. No recent injury or fall, no recent worsening of the pain. It has been steady. She was given fentanyl patient was noted to be hypoxic down to 85% on RA does not have a history of hypoxia . Patient had not had any shortness of breath or chest pain but does hurt to take a deep breath in her back. When patient ambulated oxygen saturation dropped to 83%. Denies shortness of breath no chest pain her hematoma of the left breast have been going down. denies dyspnea on exertion. She denies weight gain.  NO cough no wheezing no sick contacts.  In the beginning of April she had a fall resulting in compression fractures and was undergone kyphoplasty on 16th of April her Coumadin was held was bridged with Lovenox.   March 2018 patient was admitted with bleeding from a site of breast biopsy was found to have hemoglobin of 9 and INR 3 and recheck her hemoglobin dropped down to 6 she was admitted and transfused 2 units felt to have possible infection of breath hematoma and treated with antibiotic coverage discharge on clindamycin  Assessment & Plan:   Active Problems:   Chronic atrial fibrillation (HCC)   Mechanical heart valve present   LBBB (left bundle branch block)   Back pain   Hypoxia   Type 2 diabetes mellitus without complication (HCC)   Acute on chronic diastolic CHF  (congestive heart failure) (HCC)   Breast hematoma   Subtherapeutic international normalized ratio (INR)  . Back pain - suspected musculoskeletal in etiology - currently stable. Continue analgesics as tolerated  . Hypoxia, uncertain etiology - Initially suspected volume overload and given lasix overnight, however patient seems clinically euvolemic and with normal BNP, thus doubt heart failure - 2d echo done. Await results - Cardiology consulted and is following. Recs to f/u on 2d echo and to continue lovenox bridge until coumadin is therapeutic - Patient has ambulated with PT on room air without difficulty and without hypoxia  . Chronic diastolic CHF (congestive heart failure) ([place])  - Per above, doubt acute heart failure - Diuretics were discontinued  . Breast hematoma  - currently stable  - Without evidence of infectious process  DM 2   - Will continue with SSI coverage  . Chronic atrial fibrillation (HCC) -  - Patient has remained stable and rate controlled        - CHA2DS2 vas score 4, thus will continue current anticoagulation with  Coumadin per pharmacy,    Hx of mitral and aortic mechanical valves - INR noted to be subtherapeutic - On Lovenox bridge until INR is therapeutic at 3-3.5  . LBBB (left bundle branch block)  - Noted to be chronic - Stable  DVT prophylaxis: Lovenox bridge, coumadin Code Status: Full Family Communication: Pt in room, family not at bedside Disposition Plan: Uncertain at this time  Consultants:   Cardiology  Procedures:  Antimicrobials: Anti-infectives    None      Subjective: Questioning about going home  Objective: Vitals:   10/20/16 1425 10/20/16 2039 10/21/16 0502 10/21/16 1406  BP: 138/63 (!) 155/69 (!) 151/90 139/67  Pulse: 72 69 64 73  Resp: 18 18 18 18   Temp: 97.8 F (36.6 C) 98.4 F (36.9 C) 98 F (36.7 C) 99.1 F (37.3 C)  TempSrc: Oral Oral Oral Oral  SpO2: 97% 93% 93% 93%  Weight:   57.9 kg (127 lb  10.3 oz)   Height:        Intake/Output Summary (Last 24 hours) at 10/21/16 1501 Last data filed at 10/21/16 1456  Gross per 24 hour  Intake              120 ml  Output             1650 ml  Net            -1530 ml   Filed Weights   10/19/16 1734 10/20/16 0036 10/21/16 0502  Weight: 59 kg (130 lb) 57.3 kg (126 lb 5.2 oz) 57.9 kg (127 lb 10.3 oz)    Examination:  General exam: Awake, in nad, laying in bed Respiratory system: normal resp effort, no audible wheezing Cardiovascular system: regular rate, s1, s2 Gastrointestinal system: soft, nondistended, pos BS Central nervous system: cn2-12 grossly intact, strength intact. Extremities: perfused, no clubbing Skin: normal skin turgor, no notable skin lesions seen Psychiatry: mood normal// no visual hallucinations  Data Reviewed: I have personally reviewed following labs and imaging studies  CBC:  Recent Labs Lab 10/19/16 1825 10/20/16 0334  WBC 6.4 5.4  NEUTROABS 4.5 3.2  HGB 13.0 12.0  HCT 39.4 36.5  MCV 96.1 93.8  PLT 207 833   Basic Metabolic Panel:  Recent Labs Lab 10/19/16 1825 10/20/16 0334 10/21/16 0617  NA 138 139 140  K 4.3 3.6 3.9  CL 103 103 103  CO2 27 29 27   GLUCOSE 146* 130* 104*  BUN 11 10 14   CREATININE 0.82 0.74 0.77  CALCIUM 9.2 9.1 9.3   GFR: Estimated Creatinine Clearance: 50 mL/min (by C-G formula based on SCr of 0.77 mg/dL). Liver Function Tests:  Recent Labs Lab 10/19/16 1825  AST 26  ALT 16  ALKPHOS 85  BILITOT 0.7  PROT 7.0  ALBUMIN 4.0   No results for input(s): LIPASE, AMYLASE in the last 168 hours. No results for input(s): AMMONIA in the last 168 hours. Coagulation Profile:  Recent Labs Lab 10/17/16 1214 10/19/16 2233 10/20/16 0334 10/21/16 0617  INR 2.2 1.96 1.89 2.36   Cardiac Enzymes:  Recent Labs Lab 10/20/16 0045 10/20/16 0334 10/20/16 0650 10/20/16 1022 10/20/16 1333  TROPONINI 0.04* 0.04* 0.04* 0.03* 0.03*   BNP (last 3 results) No results for  input(s): PROBNP in the last 8760 hours. HbA1C:  Recent Labs  10/20/16 0334  HGBA1C 5.2   CBG:  Recent Labs Lab 10/20/16 1144 10/20/16 1701 10/20/16 2119 10/21/16 0817 10/21/16 1205  GLUCAP 109* 100* 120* 114* 95   Lipid Profile: No results for input(s): CHOL, HDL, LDLCALC, TRIG, CHOLHDL, LDLDIRECT in the last 72 hours. Thyroid Function Tests:  Recent Labs  10/20/16 0334  TSH 1.849   Anemia Panel: No results for input(s): VITAMINB12, FOLATE, FERRITIN, TIBC, IRON, RETICCTPCT in the last 72 hours. Sepsis Labs: No results for input(s): PROCALCITON, LATICACIDVEN in the last 168 hours.  No results found for this or any previous visit (from the past 240 hour(s)).  Radiology Studies: Dg Chest 2 View  Result Date: 10/19/2016 CLINICAL DATA:  Initial evaluation for acute mid to lower back pain. EXAM: CHEST  2 VIEW COMPARISON:  Prior radiograph from 09/17/2016. FINDINGS: Single E left-sided transvenous pacemaker/ AICD, unchanged. Median sternotomy wires noted. Stable cardiomegaly. Mediastinal silhouette grossly within normal limits. Lungs hypoinflated. Mild diffuse pulmonary pulmonary vascular congestion without frank pulmonary edema. Probable superimposed bibasilar atelectasis. No other focal infiltrates. Sequela prior vertebral augmentation seen at multiple levels within the thoracolumbar spine. Scattered compression deformities are grossly stable. No acute osseus abnormality. IMPRESSION: 1. Shallow lung inflation with associated bibasilar atelectasis. No other active cardiopulmonary disease. 2. Stable cardiomegaly with mild perihilar pulmonary vascular congestion without overt pulmonary edema. Electronically Signed   By: Jeannine Boga M.D.   On: 10/19/2016 19:11   Dg Thoracic Spine 2 View  Result Date: 10/19/2016 CLINICAL DATA:  Initial evaluation for acute right mid to lower back pain. Status post recent vertebral augmentation. EXAM: THORACIC SPINE 2 VIEWS COMPARISON:   Prior radiograph from 09/28/2016. FINDINGS: Stable alignment with exaggeration of the normal thoracic kyphosis. Sequelae of prior vertebral augmentation seen at multiple levels within the lower thoracic and upper lumbar spine. No complication identified. Overall, scattered compression deformities are stable from prior with maintained vertebral body height. No acute osseous abnormality. No soft tissue abnormality. IMPRESSION: 1. No acute abnormality identified within the thoracic spine. 2. Stable appearance of multilevel compression deformities involving the mid and lower thoracic spine with sequelae of prior vertebral augmentation. Electronically Signed   By: Jeannine Boga M.D.   On: 10/19/2016 19:15   Ct Angio Chest Pe W And/or Wo Contrast  Result Date: 10/19/2016 CLINICAL DATA:  Hypoxia.  Recent lumbar spine surgery. EXAM: CT ANGIOGRAPHY CHEST WITH CONTRAST TECHNIQUE: Multidetector CT imaging of the chest was performed using the standard protocol during bolus administration of intravenous contrast. Multiplanar CT image reconstructions and MIPs were obtained to evaluate the vascular anatomy. CONTRAST:  100 mL Isovue 370 COMPARISON:  Thoracic spine radiograph and two-view chest x-ray from the same. CT chest 08/26/2016 FINDINGS: Cardiovascular: The heart is enlarged. Bilateral atrial enlargement is noted. Atherosclerotic changes are noted in the aorta without aneurysm or definite stenosis. Pulmonary arterial opacification is satisfactory. There are no significant focal filling defects suggest pulmonary embolus. Mediastinum/Nodes: No significant mediastinal or axillary adenopathy is present. Lungs/Pleura: Bilateral pleural effusions are noted, left greater than right. Mild dependent atelectasis is present. There is no significant airspace consolidation. No nodule or mass lesion is present. Upper Abdomen: Within normal limits. Musculoskeletal: Spinal augmentation is again noted T9, T10, T11. The T4 vertebral  body height is preserved. Fractures are present through the remaining thoracic vertebral bodies. There are not significantly changed since March 2nd. Exaggerated thoracic kyphosis is associated. A hypoplastic left fourth rib is noted. Review of the MIP images confirms the above findings. IMPRESSION: 1. No evidence for pulmonary embolus. 2. Cardiomegaly without failure. 3. Median sternotomy for CABG. 4. Small bilateral pleural effusions, left greater than right with associated atelectasis. Electronically Signed   By: San Morelle M.D.   On: 10/19/2016 20:35    Scheduled Meds: . amLODipine  5 mg Oral Daily  . atorvastatin  20 mg Oral Daily  . enoxaparin (LOVENOX) injection  55 mg Subcutaneous BID  . insulin aspart  0-5 Units Subcutaneous QHS  . insulin aspart  0-9 Units Subcutaneous TID WC  . sodium chloride flush  3 mL Intravenous Q12H  . warfarin  3 mg Oral ONCE-1800  .  Warfarin - Pharmacist Dosing Inpatient   Does not apply q1800   Continuous Infusions: . sodium chloride       LOS: 2 days   CHIU, Orpah Melter, MD [facility] Pager [phone]  If 7PM-7AM, please contact night-coverage [email or web address] Password [facility] 10/21/2016, 3:01 PM

## 2016-10-21 NOTE — Progress Notes (Signed)
ANTICOAGULATION CONSULT NOTE - Follow Up Consult  Pharmacy Consult for Warfarin, enoxaparin (bridge until INR at goal) Indication: atrial fibrillation, mechanical valves (mitral, aortic)  (INR goal 2.5-3 per MD)  Allergies  Allergen Reactions  . Codeine Nausea And Vomiting    Patient Measurements: Height: 5\' 4"  (162.6 cm) Weight: 127 lb 10.3 oz (57.9 kg) IBW/kg (Calculated) : 54.7  Vital Signs: Temp: 98 F (36.7 C) (04/27 0502) Temp Source: Oral (04/27 0502) BP: 151/90 (04/27 0502) Pulse Rate: 64 (04/27 0502)  Labs:  Recent Labs  10/19/16 1825 10/19/16 2233  10/20/16 0334 10/20/16 0650 10/20/16 1022 10/20/16 1333 10/21/16 0617  HGB 13.0  --   --  12.0  --   --   --   --   HCT 39.4  --   --  36.5  --   --   --   --   PLT 207  --   --  197  --   --   --   --   LABPROT  --  22.6*  --  22.0*  --   --   --  26.2*  INR  --  1.96  --  1.89  --   --   --  2.36  CREATININE 0.82  --   --  0.74  --   --   --  0.77  TROPONINI  --  0.04*  < > 0.04* 0.04* 0.03* 0.03*  --   < > = values in this interval not displayed.  Estimated Creatinine Clearance: 50 mL/min (by C-G formula based on SCr of 0.77 mg/dL).   Medical History: Past Medical History:  Diagnosis Date  . A-fib ([place])   . Compression fracture   . Coronary artery disease   . Diabetes mellitus without complication ([place])   . Hyperlipidemia   . Hypertension   . Pacemaker     Assessment: 42 yoF with Afib and Hx rheumatic heart disease with MVR and AVR on warfarin, HTN, DM, dCHF, admitted with back pain.  INR noted to be 2.5-3.   Patient with recent hematoma and kyphoplasty, for which she was bridged with Lovenox.  INR < 2 on admit.  MD note hematoma stable.  Enoxaparin until INR at goal, per MD note.  PTA warfarin dose reported as 3 mg daily except takes 1.5 mg on Mondays; INR was therapeutic at 2.2 at anti-coag clinic visit on 4/23  Today, 10/21/2016: - INR 2.36, given 4 mg of warfarin at ~1am today - Hgb, platelets  WNL/stable (4/26) - CrCl ~50 ml/min - No significant drug interactions with warfarin noted   Goal of Therapy:  INR 2.5-3 Monitor platelets by anticoagulation protocol: Yes   Plan:  Continue enoxaparin 55mg  SQ q12hr Warfarin 3 mg at 1800 Daily INR CBC in AM  Peggyann Juba, PharmD, BCPS Pager: [phone] 10/21/2016,7:22 AM

## 2016-10-21 NOTE — Progress Notes (Signed)
Occupational Therapy Treatment Patient Details Name: Courtney Evans MRN: [id number] DOB: [birth year] Today's Date: 10/21/2016    History of present illness 81 y.o. female with medical history significant of rheumatic heart disease, status post mechanical mitral and aortic valve, diastolic heart failure, hx of pulmonary artery hypertension,  osteoporosis, vertebral compression fractures (early April) and has had multiple kyphoplasty's, hyperlipidemia, hypertension, a.fib on coumadin and LBBB, DM 2, sp pacemaker. Pt admitted with back pain and hypoxia.    OT comments  Pt completed room level functional mobility and standing ADLs with supervision-MinGuard assist this session with no LOB or dyspnea noted. O2 sats remaining 93-94% on RA throughout session. Pt reports feeling comfortable continuing to complete ADLs upon return home. Education provided and questions answered. No further acute OT needs at this time. Will sign off.    Follow Up Recommendations  No OT follow up;Supervision - Intermittent    Equipment Recommendations  None recommended by OT          Precautions / Restrictions Precautions Precautions: Fall Restrictions Weight Bearing Restrictions: No       Mobility Bed Mobility Overal bed mobility: Modified Independent Bed Mobility: Supine to Sit     Supine to sit: Supervision;HOB elevated        Transfers Overall transfer level: Needs assistance Equipment used: None Transfers: Sit to/from Stand Sit to Stand: Supervision              Balance Overall balance assessment: No apparent balance deficits (not formally assessed)                                         ADL either performed or assessed with clinical judgement   ADL Overall ADL's : Needs assistance/impaired Eating/Feeding: Independent;Sitting   Grooming: Wash/dry hands;Wash/dry face;Oral care;Min guard;Standing               Lower Body Dressing: Min guard;Sit to/from  stand Lower Body Dressing Details (indicate cue type and reason): donning socks sitting EOB Toilet Transfer: Min guard;Ambulation;Comfort height toilet;Grab bars           Functional mobility during ADLs: Min guard General ADL Comments: Completed room level functional mobility, sitting and standing ADLs with MinGuard A. Pt on RA this session with O2 sats remaining 93-94%.                        Cognition Arousal/Alertness: Awake/alert Behavior During Therapy: WFL for tasks assessed/performed Overall Cognitive Status: Within Functional Limits for tasks assessed                                                      General Comments      Pertinent Vitals/ Pain       Pain Assessment: No/denies pain                                                          Frequency  Min 2X/week        Progress Toward Goals  OT Goals(current goals can now be  found in the care plan section)  Progress towards OT goals: Goals met/education completed, patient discharged from OT  Acute Rehab OT Goals Patient Stated Goal: return to independence OT Goal Formulation: With patient  Plan All goals met and education completed, patient discharged from OT services                    End of Session Equipment Utilized During Treatment: Gait belt  OT Visit Diagnosis: Muscle weakness (generalized) (M62.81)   Activity Tolerance Patient tolerated treatment well   Patient Left in bed;with call bell/phone within reach             Time: 0973-5329 OT Time Calculation (min): 21 min  Charges: OT General Charges $OT Visit: 1 Procedure OT Treatments $Self Care/Home Management : 8-22 mins  Courtney Evans, OT Pager [phone] 10/21/2016    Courtney Evans 10/21/2016, 1:43 PM

## 2016-10-22 DIAGNOSIS — M5440 Lumbago with sciatica, unspecified side: Secondary | ICD-10-CM

## 2016-10-22 LAB — CBC
HEMATOCRIT: 38.7 % (ref 36.0–46.0)
Hemoglobin: 12.8 g/dL (ref 12.0–15.0)
MCH: 31.4 pg (ref 26.0–34.0)
MCHC: 33.1 g/dL (ref 30.0–36.0)
MCV: 94.9 fL (ref 78.0–100.0)
Platelets: 198 10*3/uL (ref 150–400)
RBC: 4.08 MIL/uL (ref 3.87–5.11)
RDW: 14.3 % (ref 11.5–15.5)
WBC: 5.7 10*3/uL (ref 4.0–10.5)

## 2016-10-22 LAB — PROTIME-INR
INR: 2.3
Prothrombin Time: 25.7 seconds — ABNORMAL HIGH (ref 11.4–15.2)

## 2016-10-22 LAB — BASIC METABOLIC PANEL
ANION GAP: 8 (ref 5–15)
BUN: 14 mg/dL (ref 6–20)
CALCIUM: 9.1 mg/dL (ref 8.9–10.3)
CHLORIDE: 104 mmol/L (ref 101–111)
CO2: 27 mmol/L (ref 22–32)
Creatinine, Ser: 0.76 mg/dL (ref 0.44–1.00)
GFR calc non Af Amer: >60 mL/min (ref >60)
GLUCOSE: 129 mg/dL — AB (ref 65–99)
POTASSIUM: 3.9 mmol/L (ref 3.5–5.1)
Sodium: 139 mmol/L (ref 135–145)

## 2016-10-22 LAB — GLUCOSE, CAPILLARY
GLUCOSE-CAPILLARY: 86 mg/dL (ref 65–99)
GLUCOSE-CAPILLARY: 76 mg/dL (ref 65–99)
GLUCOSE-CAPILLARY: 127 mg/dL — AB (ref 65–99)
Glucose-Capillary: 107 mg/dL — ABNORMAL HIGH (ref 65–99)

## 2016-10-22 MED ORDER — WARFARIN SODIUM 5 MG PO TABS
5.0000 mg | ORAL_TABLET | Freq: Once | ORAL | Status: AC
  Administered 2016-10-22: 5 mg via ORAL
  Filled 2016-10-22: qty 1

## 2016-10-22 NOTE — Progress Notes (Signed)
ANTICOAGULATION CONSULT NOTE - Follow Up Consult  Pharmacy Consult for Warfarin, enoxaparin (bridge until INR at goal) Indication: atrial fibrillation, mechanical valves (mitral, aortic)  (INR goal 3-3.5 per Cardiology)  Allergies  Allergen Reactions  . Codeine Nausea And Vomiting    Patient Measurements: Height: 5\' 4"  (162.6 cm) Weight: 127 lb 3.3 oz (57.7 kg) IBW/kg (Calculated) : 54.7  Vital Signs: Temp: 98.4 F (36.9 C) (04/28 0445) Temp Source: Oral (04/28 0445) BP: 162/77 (04/28 0445) Pulse Rate: 66 (04/28 0445)  Labs:  Recent Labs  10/19/16 1825  10/20/16 0334 10/20/16 0650 10/20/16 1022 10/20/16 1333 10/21/16 0617 10/22/16 0603  HGB 13.0  --  12.0  --   --   --   --  12.8  HCT 39.4  --  36.5  --   --   --   --  38.7  PLT 207  --  197  --   --   --   --  198  LABPROT  --   < > 22.0*  --   --   --  26.2* 25.7*  INR  --   < > 1.89  --   --   --  2.36 2.30  CREATININE 0.82  --  0.74  --   --   --  0.77 0.76  TROPONINI  --   < > 0.04* 0.04* 0.03* 0.03*  --   --   < > = values in this interval not displayed.  Estimated Creatinine Clearance: 50 mL/min (by C-G formula based on SCr of 0.76 mg/dL).   Medical History: Past Medical History:  Diagnosis Date  . A-fib ([place])   . Compression fracture   . Coronary artery disease   . Diabetes mellitus without complication ([place])   . Hyperlipidemia   . Hypertension   . Pacemaker     Assessment: 36 yoF with Afib and Hx rheumatic heart disease with MVR and AVR on warfarin, HTN, DM, dCHF, admitted with back pain.  INR noted to be 2.5-3.   Patient with recent hematoma and kyphoplasty, for which she was bridged with Lovenox.  INR < 2 on admit.  MD note hematoma stable.  Enoxaparin until INR at goal, per MD note.  PTA warfarin dose reported as 3 mg daily except takes 1.5 mg on Mondays; INR was therapeutic at 2.2 at anti-coag clinic visit on 4/23  Today, 10/22/2016: - INR decreased to 2.3 - Hgb, platelets WNL/stable  - No  bleeding reported (breast hematoma noted to be stable) - CrCl ~50 ml/min - No significant drug interactions with warfarin noted   Goal of Therapy:  INR 3-3.5 per Cardiology (Dr. Debara Pickett) Monitor platelets by anticoagulation protocol: Yes   Plan:  Continue enoxaparin 55mg  SQ q12hr Increase Warfarin 5 mg at 1800 Daily INR  Peggyann Juba, PharmD, BCPS Pager: [phone] 10/22/2016,8:54 AM

## 2016-10-22 NOTE — Progress Notes (Signed)
PROGRESS NOTE    Courtney Evans  QBH:[id number] DOB: 08/12/1935 DOA: 10/19/2016 PCP: Cari Caraway, MD    Brief Narrative:  81 y.o. female with medical history significant of rheumatic heart disease, status post mechanical mitral and aortic valve, diastolic heart failure, hx of pulmonary artery hypertension   osteoporosis vertebral compression fractions and has had multiple kyphoplasty's, hyperlipidemia, hypertension, a.fib on coumadin and LBBB, DM 2, sp pacemaker  Presented with right mid to lower back pain started for 3-4 weeks she states she was ignoring the pain but finally decided to get it checked out so she called 911. No recent injury or fall, no recent worsening of the pain. It has been steady. She was given fentanyl patient was noted to be hypoxic down to 85% on RA does not have a history of hypoxia . Patient had not had any shortness of breath or chest pain but does hurt to take a deep breath in her back. When patient ambulated oxygen saturation dropped to 83%. Denies shortness of breath no chest pain her hematoma of the left breast have been going down. denies dyspnea on exertion. She denies weight gain.  NO cough no wheezing no sick contacts.  In the beginning of April she had a fall resulting in compression fractures and was undergone kyphoplasty on 16th of April her Coumadin was held was bridged with Lovenox.   March 2018 patient was admitted with bleeding from a site of breast biopsy was found to have hemoglobin of 9 and INR 3 and recheck her hemoglobin dropped down to 6 she was admitted and transfused 2 units felt to have possible infection of breath hematoma and treated with antibiotic coverage discharge on clindamycin  Assessment & Plan:   Active Problems:   Chronic atrial fibrillation (HCC)   Mechanical heart valve present   LBBB (left bundle branch block)   Back pain   Hypoxia   Type 2 diabetes mellitus without complication (HCC)   Acute on chronic diastolic CHF  (congestive heart failure) (HCC)   Breast hematoma   Subtherapeutic international normalized ratio (INR)  . Back pain - suspected musculoskeletal in etiology - remains stable. Continue analgesics as tolerated  . Hypoxia, uncertain etiology - Initially suspected volume overload and given lasix overnight, however patient seems clinically euvolemic and with normal BNP, thus doubt heart failure - 2d echo done. Await results - Cardiology consulted and is following. Recs to f/u on 2d echo and to continue lovenox bridge until coumadin is therapeutic - Patient noted to have ambulated with PT on room air without difficulty and without hypoxia  . Chronic diastolic CHF (congestive heart failure) (HCC)  - Per above, doubt acute heart failure - IV diuretics were discontinued  . Breast hematoma  - currently stable  - Paitent is without evidence of infectious process  DM 2   - Will continue with SSI coverage - Stable at present  . Chronic atrial fibrillation (HCC) -  - Patient has remained stable and rate controlled        - CHA2DS2 vas score 4 - Will continue with current anticoagulation with  Coumadin per pharmacy,    Hx of mitral and aortic mechanical valves - INR noted to be subtherapeutic at 2.3 - On Lovenox bridge until INR is therapeutic at 3-3.5  . LBBB (left bundle branch block)  - Noted to be chronic - Remains stable  DVT prophylaxis: Lovenox bridge, coumadin Code Status: Full Family Communication: Pt in room, family not at bedside Disposition Plan: Uncertain at  this time  Consultants:   Cardiology  Procedures:     Antimicrobials: Anti-infectives    None      Subjective: No complaints at this time  Objective: Vitals:   10/21/16 1406 10/21/16 2110 10/22/16 0445 10/22/16 1404  BP: 139/67 106/86 (!) 162/77 (!) 159/73  Pulse: 73 74 66 66  Resp: 18 18 18 18   Temp: 99.1 F (37.3 C) 98.2 F (36.8 C) 98.4 F (36.9 C) 97.8 F (36.6 C)  TempSrc: Oral Oral Oral  Oral  SpO2: 93% 93% 93% 95%  Weight:   57.7 kg (127 lb 3.3 oz)   Height:        Intake/Output Summary (Last 24 hours) at 10/22/16 1600 Last data filed at 10/22/16 1405  Gross per 24 hour  Intake                0 ml  Output             1300 ml  Net            -1300 ml   Filed Weights   10/20/16 0036 10/21/16 0502 10/22/16 0445  Weight: 57.3 kg (126 lb 5.2 oz) 57.9 kg (127 lb 10.3 oz) 57.7 kg (127 lb 3.3 oz)    Examination:  General exam: Conversant, in nad, laying in bed Respiratory system: normal chest rise, no audible wheezing Cardiovascular system: regular rhythm, s1, s2 Gastrointestinal system: nontender, nondistended, pos BS Central nervous system: no seizures, no tremors Extremities: no cyanosis, no joint deformities Skin: no rashes, no palllor Psychiatry: affect normal// no auditory hallucinations  Data Reviewed: I have personally reviewed following labs and imaging studies  CBC:  Recent Labs Lab 10/19/16 1825 10/20/16 0334 10/22/16 0603  WBC 6.4 5.4 5.7  NEUTROABS 4.5 3.2  --   HGB 13.0 12.0 12.8  HCT 39.4 36.5 38.7  MCV 96.1 93.8 94.9  PLT 207 197 037   Basic Metabolic Panel:  Recent Labs Lab 10/19/16 1825 10/20/16 0334 10/21/16 0617 10/22/16 0603  NA 138 139 140 139  K 4.3 3.6 3.9 3.9  CL 103 103 103 104  CO2 27 29 27 27   GLUCOSE 146* 130* 104* 129*  BUN 11 10 14 14   CREATININE 0.82 0.74 0.77 0.76  CALCIUM 9.2 9.1 9.3 9.1   GFR: Estimated Creatinine Clearance: 50 mL/min (by C-G formula based on SCr of 0.76 mg/dL). Liver Function Tests:  Recent Labs Lab 10/19/16 1825  AST 26  ALT 16  ALKPHOS 85  BILITOT 0.7  PROT 7.0  ALBUMIN 4.0   No results for input(s): LIPASE, AMYLASE in the last 168 hours. No results for input(s): AMMONIA in the last 168 hours. Coagulation Profile:  Recent Labs Lab 10/17/16 1214 10/19/16 2233 10/20/16 0334 10/21/16 0617 10/22/16 0603  INR 2.2 1.96 1.89 2.36 2.30   Cardiac Enzymes:  Recent  Labs Lab 10/20/16 0045 10/20/16 0334 10/20/16 0650 10/20/16 1022 10/20/16 1333  TROPONINI 0.04* 0.04* 0.04* 0.03* 0.03*   BNP (last 3 results) No results for input(s): PROBNP in the last 8760 hours. HbA1C:  Recent Labs  10/20/16 0334  HGBA1C 5.2   CBG:  Recent Labs Lab 10/21/16 1205 10/21/16 1709 10/21/16 2027 10/22/16 0753 10/22/16 1214  GLUCAP 95 120* 131* 107* 86   Lipid Profile: No results for input(s): CHOL, HDL, LDLCALC, TRIG, CHOLHDL, LDLDIRECT in the last 72 hours. Thyroid Function Tests:  Recent Labs  10/20/16 0334  TSH 1.849   Anemia Panel: No results for input(s): VITAMINB12, FOLATE,  FERRITIN, TIBC, IRON, RETICCTPCT in the last 72 hours. Sepsis Labs: No results for input(s): PROCALCITON, LATICACIDVEN in the last 168 hours.  No results found for this or any previous visit (from the past 240 hour(s)).   Radiology Studies: No results found.  Scheduled Meds: . amLODipine  5 mg Oral Daily  . atorvastatin  20 mg Oral Daily  . enoxaparin (LOVENOX) injection  55 mg Subcutaneous BID  . insulin aspart  0-5 Units Subcutaneous QHS  . insulin aspart  0-9 Units Subcutaneous TID WC  . sodium chloride flush  3 mL Intravenous Q12H  . warfarin  5 mg Oral ONCE-1800  . Warfarin - Pharmacist Dosing Inpatient   Does not apply q1800   Continuous Infusions: . sodium chloride       LOS: 3 days   CHIU, Orpah Melter, MD [facility] Pager [phone]  If 7PM-7AM, please contact night-coverage [email or web address] Password [email or web address] 10/22/2016, 4:00 PM

## 2016-10-23 LAB — BASIC METABOLIC PANEL
ANION GAP: 8 (ref 5–15)
BUN: 16 mg/dL (ref 6–20)
CALCIUM: 9.5 mg/dL (ref 8.9–10.3)
CO2: 26 mmol/L (ref 22–32)
CREATININE: 0.79 mg/dL (ref 0.44–1.00)
Chloride: 105 mmol/L (ref 101–111)
GFR calc Af Amer: >60 mL/min (ref >60)
GFR calc non Af Amer: >60 mL/min (ref >60)
GLUCOSE: 129 mg/dL — AB (ref 65–99)
POTASSIUM: 4.1 mmol/L (ref 3.5–5.1)
SODIUM: 139 mmol/L (ref 135–145)

## 2016-10-23 LAB — GLUCOSE, CAPILLARY
Glucose-Capillary: 124 mg/dL — ABNORMAL HIGH (ref 65–99)
Glucose-Capillary: 128 mg/dL — ABNORMAL HIGH (ref 65–99)
Glucose-Capillary: 88 mg/dL (ref 65–99)
Glucose-Capillary: 140 mg/dL — ABNORMAL HIGH (ref 65–99)

## 2016-10-23 LAB — PROTIME-INR
INR: 2.46
Prothrombin Time: 27.1 seconds — ABNORMAL HIGH (ref 11.4–15.2)

## 2016-10-23 MED ORDER — WARFARIN SODIUM 5 MG PO TABS
5.0000 mg | ORAL_TABLET | Freq: Once | ORAL | Status: AC
  Administered 2016-10-23: 5 mg via ORAL
  Filled 2016-10-23: qty 1

## 2016-10-23 NOTE — Progress Notes (Signed)
PROGRESS NOTE    Courtney Evans  AJG:[id number] DOB: 07/04/1935 DOA: 10/19/2016 PCP: Cari Caraway, MD    Brief Narrative:  81 y.o. female with medical history significant of rheumatic heart disease, status post mechanical mitral and aortic valve, diastolic heart failure, hx of pulmonary artery hypertension   osteoporosis vertebral compression fractions and has had multiple kyphoplasty's, hyperlipidemia, hypertension, a.fib on coumadin and LBBB, DM 2, sp pacemaker  Presented with right mid to lower back pain started for 3-4 weeks she states she was ignoring the pain but finally decided to get it checked out so she called 911. No recent injury or fall, no recent worsening of the pain. It has been steady. She was given fentanyl patient was noted to be hypoxic down to 85% on RA does not have a history of hypoxia . Patient had not had any shortness of breath or chest pain but does hurt to take a deep breath in her back. When patient ambulated oxygen saturation dropped to 83%. Denies shortness of breath no chest pain her hematoma of the left breast have been going down. denies dyspnea on exertion. She denies weight gain.  NO cough no wheezing no sick contacts.  In the beginning of April she had a fall resulting in compression fractures and was undergone kyphoplasty on 16th of April her Coumadin was held was bridged with Lovenox.   March 2018 patient was admitted with bleeding from a site of breast biopsy was found to have hemoglobin of 9 and INR 3 and recheck her hemoglobin dropped down to 6 she was admitted and transfused 2 units felt to have possible infection of breath hematoma and treated with antibiotic coverage discharge on clindamycin  Assessment & Plan:   Active Problems:   Chronic atrial fibrillation (HCC)   Mechanical heart valve present   LBBB (left bundle branch block)   Back pain   Hypoxia   Type 2 diabetes mellitus without complication (HCC)   Acute on chronic diastolic CHF  (congestive heart failure) (HCC)   Breast hematoma   Subtherapeutic international normalized ratio (INR)  . Back pain - suspected musculoskeletal in etiology - currently stable. Will continue analgesics as tolerated  . Hypoxia, uncertain etiology - Initially suspected volume overload and given lasix overnight, however patient seems clinically euvolemic and with normal BNP, thus doubt heart failure - 2d echo done. Await results - Cardiology consulted and is following. Recs to f/u on 2d echo and to continue lovenox bridge until coumadin is therapeutic - Patient recently noted to have ambulated with PT on room air without difficulty and without hypoxia  . Chronic diastolic CHF (congestive heart failure) ([place])  - Per above, doubt acute heart failure - IV diuretics were discontinued per Cardiology recommendations  . Breast hematoma  - remains stable  - Paitent is without evidence of infectious process  DM 2   - Will continue with SSI coverage - Currently stable  . Chronic atrial fibrillation (HCC) -  - Patient has remained stable and rate controlled        - CHA2DS2 vas score 4 - Will continue with current anticoagulation with  Coumadin per pharmacy, per below  Hx of mitral and aortic mechanical valves - INR noted to be subtherapeutic at 2.4 - On Lovenox bridge until INR is therapeutic at 3-3.5  . LBBB (left bundle branch block)  - Noted to be chronic - Currently stable  DVT prophylaxis: Lovenox bridge, coumadin Code Status: Full Family Communication: Pt in room, family not at bedside  Disposition Plan: Uncertain at this time  Consultants:   Cardiology  Procedures:     Antimicrobials: Anti-infectives    None      Subjective: No complaints at this time  Objective: Vitals:   10/22/16 1404 10/22/16 2103 10/23/16 0558 10/23/16 1313  BP: (!) 159/73 (!) 153/68 (!) 166/83 (!) 145/82  Pulse: 66 69 66 67  Resp: 18 16 16 16   Temp: 97.8 F (36.6 C) 98 F (36.7 C)  98.1 F (36.7 C) 98.5 F (36.9 C)  TempSrc: Oral Oral Oral Oral  SpO2: 95% 95% 93% 94%  Weight:   57 kg (125 lb 10.6 oz)   Height:        Intake/Output Summary (Last 24 hours) at 10/23/16 1342 Last data filed at 10/23/16 1314  Gross per 24 hour  Intake              240 ml  Output             1600 ml  Net            -1360 ml   Filed Weights   10/21/16 0502 10/22/16 0445 10/23/16 0558  Weight: 57.9 kg (127 lb 10.3 oz) 57.7 kg (127 lb 3.3 oz) 57 kg (125 lb 10.6 oz)    Examination:  General exam: awake, laying in bed, in nad, conversant Respiratory system: normal resp effort, no wheezing Cardiovascular system: regular rate, s1, s2 Gastrointestinal system: nontender, nondistended, pos BS Central nervous system: cn2-12 grossly intact, strength intact Extremities: no clubbing, perfused Skin: normal skin turgor,no notable skin lesions seen Psychiatry: mood normal// no visual hallucinations  Data Reviewed: I have personally reviewed following labs and imaging studies  CBC:  Recent Labs Lab 10/19/16 1825 10/20/16 0334 10/22/16 0603  WBC 6.4 5.4 5.7  NEUTROABS 4.5 3.2  --   HGB 13.0 12.0 12.8  HCT 39.4 36.5 38.7  MCV 96.1 93.8 94.9  PLT 207 197 099   Basic Metabolic Panel:  Recent Labs Lab 10/19/16 1825 10/20/16 0334 10/21/16 0617 10/22/16 0603 10/23/16 0556  NA 138 139 140 139 139  K 4.3 3.6 3.9 3.9 4.1  CL 103 103 103 104 105  CO2 27 29 27 27 26   GLUCOSE 146* 130* 104* 129* 129*  BUN 11 10 14 14 16   CREATININE 0.82 0.74 0.77 0.76 0.79  CALCIUM 9.2 9.1 9.3 9.1 9.5   GFR: Estimated Creatinine Clearance: 50 mL/min (by C-G formula based on SCr of 0.79 mg/dL). Liver Function Tests:  Recent Labs Lab 10/19/16 1825  AST 26  ALT 16  ALKPHOS 85  BILITOT 0.7  PROT 7.0  ALBUMIN 4.0   No results for input(s): LIPASE, AMYLASE in the last 168 hours. No results for input(s): AMMONIA in the last 168 hours. Coagulation Profile:  Recent Labs Lab 10/19/16 2233  10/20/16 0334 10/21/16 0617 10/22/16 0603 10/23/16 0556  INR 1.96 1.89 2.36 2.30 2.46   Cardiac Enzymes:  Recent Labs Lab 10/20/16 0045 10/20/16 0334 10/20/16 0650 10/20/16 1022 10/20/16 1333  TROPONINI 0.04* 0.04* 0.04* 0.03* 0.03*   BNP (last 3 results) No results for input(s): PROBNP in the last 8760 hours. HbA1C: No results for input(s): HGBA1C in the last 72 hours. CBG:  Recent Labs Lab 10/22/16 1214 10/22/16 1654 10/22/16 2111 10/23/16 0656 10/23/16 1141  GLUCAP 86 76 127* 124* 128*   Lipid Profile: No results for input(s): CHOL, HDL, LDLCALC, TRIG, CHOLHDL, LDLDIRECT in the last 72 hours. Thyroid Function Tests: No results for input(s):  TSH, T4TOTAL, FREET4, T3FREE, THYROIDAB in the last 72 hours. Anemia Panel: No results for input(s): VITAMINB12, FOLATE, FERRITIN, TIBC, IRON, RETICCTPCT in the last 72 hours. Sepsis Labs: No results for input(s): PROCALCITON, LATICACIDVEN in the last 168 hours.  No results found for this or any previous visit (from the past 240 hour(s)).   Radiology Studies: No results found.  Scheduled Meds: . amLODipine  5 mg Oral Daily  . atorvastatin  20 mg Oral Daily  . enoxaparin (LOVENOX) injection  55 mg Subcutaneous BID  . insulin aspart  0-5 Units Subcutaneous QHS  . insulin aspart  0-9 Units Subcutaneous TID WC  . sodium chloride flush  3 mL Intravenous Q12H  . warfarin  5 mg Oral ONCE-1800  . Warfarin - Pharmacist Dosing Inpatient   Does not apply q1800   Continuous Infusions: . sodium chloride       LOS: 4 days   Kennith Morss, Orpah Melter, MD [facility] Pager [phone]  If 7PM-7AM, please contact night-coverage [email or web address] Password [email or web address] 10/23/2016, 1:42 PM

## 2016-10-23 NOTE — Progress Notes (Signed)
ANTICOAGULATION CONSULT NOTE - Follow Up Consult  Pharmacy Consult for Warfarin, enoxaparin (bridge until INR at goal) Indication: atrial fibrillation, mechanical valves (mitral, aortic)  (INR goal 3-3.5 per Cardiology)  Allergies  Allergen Reactions  . Codeine Nausea And Vomiting    Patient Measurements: Height: 5\' 4"  (162.6 cm) Weight: 125 lb 10.6 oz (57 kg) IBW/kg (Calculated) : 54.7  Vital Signs: Temp: 98.1 F (36.7 C) (04/29 0558) Temp Source: Oral (04/29 0558) BP: 166/83 (04/29 0558) Pulse Rate: 66 (04/29 0558)  Labs:  Recent Labs  10/20/16 1022 10/20/16 1333 10/21/16 0617 10/22/16 0603 10/23/16 0556  HGB  --   --   --  12.8  --   HCT  --   --   --  38.7  --   PLT  --   --   --  198  --   LABPROT  --   --  26.2* 25.7* 27.1*  INR  --   --  2.36 2.30 2.46  CREATININE  --   --  0.77 0.76 0.79  TROPONINI 0.03* 0.03*  --   --   --     Estimated Creatinine Clearance: 50 mL/min (by C-G formula based on SCr of 0.79 mg/dL).   Medical History: Past Medical History:  Diagnosis Date  . A-fib ([place])   . Compression fracture   . Coronary artery disease   . Diabetes mellitus without complication ([place])   . Hyperlipidemia   . Hypertension   . Pacemaker     Assessment: 80 yoF with Afib and Hx rheumatic heart disease with MVR and AVR on warfarin, HTN, DM, dCHF, admitted with back pain.  INR noted to be 2.5-3.   Patient with recent hematoma and kyphoplasty, for which she was bridged with Lovenox.  INR < 2 on admit.  MD note hematoma stable.  Enoxaparin until INR at goal, per MD note.  PTA warfarin dose reported as 3 mg daily except takes 1.5 mg on Mondays; INR was 2.2 at anti-coag clinic visit on 4/23 - with new INR goal, patient likely to new higher dose at discharge.  Today, 10/23/2016: - INR subtherapeutic at 2.46 - Hgb, platelets WNL/stable (4/29) - No bleeding reported (breast hematoma noted to be stable) - CrCl ~50 ml/min - No significant drug interactions with  warfarin noted   Goal of Therapy:  INR 3-3.5 per Cardiology (Dr. Debara Pickett) Monitor platelets by anticoagulation protocol: Yes   Plan:  Continue enoxaparin 55mg  SQ q12hr Warfarin 5 mg at 1800 Daily INR  Peggyann Juba, PharmD, BCPS Pager: [phone] 10/23/2016,8:43 AM

## 2016-10-24 LAB — PROTIME-INR
INR: 3.29
Prothrombin Time: 34.2 seconds — ABNORMAL HIGH (ref 11.4–15.2)

## 2016-10-24 LAB — GLUCOSE, CAPILLARY
Glucose-Capillary: 110 mg/dL — ABNORMAL HIGH (ref 65–99)
Glucose-Capillary: 87 mg/dL (ref 65–99)
Glucose-Capillary: 104 mg/dL — ABNORMAL HIGH (ref 65–99)

## 2016-10-24 MED ORDER — WARFARIN SODIUM 2 MG PO TABS
2.0000 mg | ORAL_TABLET | Freq: Once | ORAL | Status: DC
  Filled 2016-10-24: qty 1

## 2016-10-24 NOTE — Discharge Summary (Signed)
Physician Discharge Summary  Courtney Evans UDJ:[id number] DOB: 1935/08/22 DOA: 10/19/2016  PCP: Cari Caraway, MD  Admit date: 10/19/2016 Discharge date: 10/24/2016  Admitted From: Home Disposition:  Home  Recommendations for Outpatient Follow-up:  1. Follow up with PCP in 1-2 weeks 2. Please continue coumadin level checks. Goal INR 3-3.5 3. INR on 4/30 of 3.29    Discharge Condition:Stable CODE STATUS:Full Diet recommendation: Heart healthy   Brief/Interim Summary: 81 y.o.femalewith medical history significant of rheumatic heart disease, status post mechanical mitral and aortic valve, diastolic heart failure, hx of pulmonary artery hypertension  osteoporosis vertebral compression fractions and has had multiple kyphoplasty's, hyperlipidemia, hypertension, a.fib on coumadin and LBBB, DM 2, sp pacemaker  Presented with right mid to lower back pain started for 3-4 weeks she states she was ignoring the pain but finally decided to get it checked out so she called 911. No recent injury or fall, no recent worsening of the pain. It has been steady. Shewas given fentanyl patient was noted to be hypoxic down to 85% on RAdoes not have a history of hypoxia.Patient had not had any shortness of breath or chest pain but does hurt to take a deep breath in her back.When patient ambulated oxygen saturation dropped to 83%. Denies shortness of breath no chest pain her hematoma of the left breast have been going down. denies dyspnea on exertion. She denies weight gain.  NO cough no wheezing no sick contacts.  In the beginning of April she had a fall resulting in compression fractures and was undergone kyphoplasty on 16th of April her Coumadin was held was bridged with Lovenox.  March 2018 patient was admitted with bleeding from a site of breast biopsy was found to have hemoglobin of 9 and INR 3and recheck her hemoglobin dropped down to 6 she was admitted and transfused 2 units felt to have possible  infection of breath hematoma and treated with antibiotic coverage discharge on clindamycin   . Back pain - suspected musculoskeletal in etiology - currently stable.  . Hypoxia, uncertain etiology - Initially suspected volume overload and given lasix overnight, however patient seems clinically euvolemic and with normal BNP, thus doubt heart failure - 2d echo done. Await results - Cardiology consulted and had been following. Recs to continue lovenox bridge until coumadin is therapeutic - Patient recently noted to have ambulated with PT on room air without difficulty and without hypoxia - Coumadin became therapeutic as of 4/30  . Chronic diastolic CHF (congestive heart failure) ([place]) - Per above, doubt acute heart failure - IV diuretics were discontinued per Cardiology recommendations  . Breast hematoma - remains stable  - Paitent is without evidence of infectious process  DM 2  - Will continue with SSI coverage - Currently stable  . Chronic atrial fibrillation (HCC) -  - Patient has remained stable and rate controlled - CHA2DS2 vas score 4 - Will continue with current anticoagulation with Coumadin per pharmacy, per below  Hx of mitral and aortic mechanical valves - INR noted to be subtherapeutic at 2.4 - On Lovenox bridge until INR is therapeutic at 3-3.5  . LBBB (left bundle branch block)  - Noted to be chronic - Currently stable  Discharge Diagnoses:  Active Problems:   Chronic atrial fibrillation (HCC)   Mechanical heart valve present   LBBB (left bundle branch block)   Back pain   Hypoxia   Type 2 diabetes mellitus without complication (HCC)   Acute on chronic diastolic CHF (congestive heart failure) ([place])   Breast  hematoma   Subtherapeutic international normalized ratio (INR)    Discharge Instructions   Allergies as of 10/24/2016      Reactions   Codeine Nausea And Vomiting      Medication List    STOP taking these medications    enoxaparin 60 MG/0.6ML injection Commonly known as:  LOVENOX     TAKE these medications   acetaminophen 500 MG tablet Commonly known as:  TYLENOL Take 500-1,000 mg by mouth every 6 (six) hours as needed for mild pain, moderate pain, fever or headache.   amLODipine 5 MG tablet Commonly known as:  NORVASC Take 5 mg by mouth daily.   atorvastatin 20 MG tablet Commonly known as:  LIPITOR Take 20 mg by mouth daily.   cholecalciferol 1000 units tablet Commonly known as:  VITAMIN D Take 2,000 Units by mouth daily.   metFORMIN 500 MG tablet Commonly known as:  GLUCOPHAGE Take 500 mg by mouth 2 (two) times daily with a meal.   nitroGLYCERIN 0.4 MG SL tablet Commonly known as:  NITROSTAT Place 0.4 mg under the tongue every 5 (five) minutes as needed for chest pain.   polyethylene glycol packet Commonly known as:  MIRALAX / GLYCOLAX Take 17 g by mouth daily as needed for mild constipation.   warfarin 3 MG tablet Commonly known as:  COUMADIN Take 1.5-3 mg by mouth every evening. Pt takes one-half tablet on Monday and one tablet all other days.      Follow-up Information    MCNEILL,WENDY, MD. Schedule an appointment as soon as possible for a visit in 1 week(s).   Specialty:  Family Medicine Contact information: [place] [address] [phone]          Allergies  Allergen Reactions  . Codeine Nausea And Vomiting    Consultations:  Cardiology  Procedures/Studies: Dg Chest 2 View  Result Date: 10/19/2016 CLINICAL DATA:  Initial evaluation for acute mid to lower back pain. EXAM: CHEST  2 VIEW COMPARISON:  Prior radiograph from 09/17/2016. FINDINGS: Single E left-sided transvenous pacemaker/ AICD, unchanged. Median sternotomy wires noted. Stable cardiomegaly. Mediastinal silhouette grossly within normal limits. Lungs hypoinflated. Mild diffuse pulmonary pulmonary vascular congestion without frank pulmonary edema. Probable superimposed bibasilar  atelectasis. No other focal infiltrates. Sequela prior vertebral augmentation seen at multiple levels within the thoracolumbar spine. Scattered compression deformities are grossly stable. No acute osseus abnormality. IMPRESSION: 1. Shallow lung inflation with associated bibasilar atelectasis. No other active cardiopulmonary disease. 2. Stable cardiomegaly with mild perihilar pulmonary vascular congestion without overt pulmonary edema. Electronically Signed   By: Jeannine Boga M.D.   On: 10/19/2016 19:11   Dg Thoracic Spine 2 View  Result Date: 10/19/2016 CLINICAL DATA:  Initial evaluation for acute right mid to lower back pain. Status post recent vertebral augmentation. EXAM: THORACIC SPINE 2 VIEWS COMPARISON:  Prior radiograph from 09/28/2016. FINDINGS: Stable alignment with exaggeration of the normal thoracic kyphosis. Sequelae of prior vertebral augmentation seen at multiple levels within the lower thoracic and upper lumbar spine. No complication identified. Overall, scattered compression deformities are stable from prior with maintained vertebral body height. No acute osseous abnormality. No soft tissue abnormality. IMPRESSION: 1. No acute abnormality identified within the thoracic spine. 2. Stable appearance of multilevel compression deformities involving the mid and lower thoracic spine with sequelae of prior vertebral augmentation. Electronically Signed   By: Jeannine Boga M.D.   On: 10/19/2016 19:15   Ct Angio Chest Pe W And/or Wo Contrast  Result Date:  10/19/2016 CLINICAL DATA:  Hypoxia.  Recent lumbar spine surgery. EXAM: CT ANGIOGRAPHY CHEST WITH CONTRAST TECHNIQUE: Multidetector CT imaging of the chest was performed using the standard protocol during bolus administration of intravenous contrast. Multiplanar CT image reconstructions and MIPs were obtained to evaluate the vascular anatomy. CONTRAST:  100 mL Isovue 370 COMPARISON:  Thoracic spine radiograph and two-view chest x-ray  from the same. CT chest 08/26/2016 FINDINGS: Cardiovascular: The heart is enlarged. Bilateral atrial enlargement is noted. Atherosclerotic changes are noted in the aorta without aneurysm or definite stenosis. Pulmonary arterial opacification is satisfactory. There are no significant focal filling defects suggest pulmonary embolus. Mediastinum/Nodes: No significant mediastinal or axillary adenopathy is present. Lungs/Pleura: Bilateral pleural effusions are noted, left greater than right. Mild dependent atelectasis is present. There is no significant airspace consolidation. No nodule or mass lesion is present. Upper Abdomen: Within normal limits. Musculoskeletal: Spinal augmentation is again noted T9, T10, T11. The T4 vertebral body height is preserved. Fractures are present through the remaining thoracic vertebral bodies. There are not significantly changed since March 2nd. Exaggerated thoracic kyphosis is associated. A hypoplastic left fourth rib is noted. Review of the MIP images confirms the above findings. IMPRESSION: 1. No evidence for pulmonary embolus. 2. Cardiomegaly without failure. 3. Median sternotomy for CABG. 4. Small bilateral pleural effusions, left greater than right with associated atelectasis. Electronically Signed   By: San Morelle M.D.   On: 10/19/2016 20:35    Subjective: Eager to go home  Discharge Exam: Vitals:   10/24/16 0433 10/24/16 0922  BP: (!) 152/68 (!) 155/62  Pulse: 60   Resp: 16   Temp: 98.3 F (36.8 C)    Vitals:   10/23/16 1313 10/23/16 2053 10/24/16 0433 10/24/16 0922  BP: (!) 145/82 (!) 157/73 (!) 152/68 (!) 155/62  Pulse: 67 70 60   Resp: 16 20 16    Temp: 98.5 F (36.9 C) 98.4 F (36.9 C) 98.3 F (36.8 C)   TempSrc: Oral Oral Oral   SpO2: 94% 93% 91%   Weight:   56.9 kg (125 lb 7.1 oz)   Height:        General: Pt is alert, awake, not in acute distress Cardiovascular: RRR, S1/S2 +, no rubs, no gallops Respiratory: CTA bilaterally, no  wheezing, no rhonchi Abdominal: Soft, NT, ND, bowel sounds + Extremities: no edema, no cyanosis   The results of significant diagnostics from this hospitalization (including imaging, microbiology, ancillary and laboratory) are listed below for reference.     Microbiology: No results found for this or any previous visit (from the past 240 hour(s)).   Labs: BNP (last 3 results)  Recent Labs  10/19/16 2233 10/20/16 0334  BNP 86.5 93.7   Basic Metabolic Panel:  Recent Labs Lab 10/19/16 1825 10/20/16 0334 10/21/16 0617 10/22/16 0603 10/23/16 0556  NA 138 139 140 139 139  K 4.3 3.6 3.9 3.9 4.1  CL 103 103 103 104 105  CO2 27 29 27 27 26   GLUCOSE 146* 130* 104* 129* 129*  BUN 11 10 14 14 16   CREATININE 0.82 0.74 0.77 0.76 0.79  CALCIUM 9.2 9.1 9.3 9.1 9.5   Liver Function Tests:  Recent Labs Lab 10/19/16 1825  AST 26  ALT 16  ALKPHOS 85  BILITOT 0.7  PROT 7.0  ALBUMIN 4.0   No results for input(s): LIPASE, AMYLASE in the last 168 hours. No results for input(s): AMMONIA in the last 168 hours. CBC:  Recent Labs Lab 10/19/16 1825 10/20/16 0334 10/22/16  0603  WBC 6.4 5.4 5.7  NEUTROABS 4.5 3.2  --   HGB 13.0 12.0 12.8  HCT 39.4 36.5 38.7  MCV 96.1 93.8 94.9  PLT 207 197 198   Cardiac Enzymes:  Recent Labs Lab 10/20/16 0045 10/20/16 0334 10/20/16 0650 10/20/16 1022 10/20/16 1333  TROPONINI 0.04* 0.04* 0.04* 0.03* 0.03*   BNP: Invalid input(s): POCBNP CBG:  Recent Labs Lab 10/23/16 1141 10/23/16 1645 10/23/16 2057 10/24/16 0743 10/24/16 1216  GLUCAP 128* 88 140* 110* 87   D-Dimer No results for input(s): DDIMER in the last 72 hours. Hgb A1c No results for input(s): HGBA1C in the last 72 hours. Lipid Profile No results for input(s): CHOL, HDL, LDLCALC, TRIG, CHOLHDL, LDLDIRECT in the last 72 hours. Thyroid function studies No results for input(s): TSH, T4TOTAL, T3FREE, THYROIDAB in the last 72 hours.  Invalid input(s):  FREET3 Anemia work up No results for input(s): VITAMINB12, FOLATE, FERRITIN, TIBC, IRON, RETICCTPCT in the last 72 hours. Urinalysis No results found for: COLORURINE, APPEARANCEUR, LABSPEC, [place], GLUCOSEU, HGBUR, BILIRUBINUR, KETONESUR, PROTEINUR, UROBILINOGEN, NITRITE, LEUKOCYTESUR Sepsis Labs Invalid input(s): PROCALCITONIN,  WBC,  LACTICIDVEN Microbiology No results found for this or any previous visit (from the past 240 hour(s)).   SIGNED:   Donne Hazel, MD  [facility] 10/24/2016, 3:30 PM  If 7PM-7AM, please contact night-coverage [email or web address] Password [email or web address]

## 2016-10-24 NOTE — Progress Notes (Signed)
ANTICOAGULATION CONSULT NOTE - Follow Up Consult  Pharmacy Consult for Warfarin, enoxaparin (bridge until INR at goal) Indication: atrial fibrillation, mechanical valves (mitral, aortic)  (INR goal 3-3.5 per Cardiology)  Allergies  Allergen Reactions  . Codeine Nausea And Vomiting    Patient Measurements: Height: 5\' 4"  (162.6 cm) Weight: 125 lb 7.1 oz (56.9 kg) IBW/kg (Calculated) : 54.7  Vital Signs: Temp: 98.3 F (36.8 C) (04/30 0433) Temp Source: Oral (04/30 0433) BP: 155/62 (04/30 0922) Pulse Rate: 60 (04/30 0433)  Labs:  Recent Labs  10/22/16 0603 10/23/16 0556 10/24/16 0456  HGB 12.8  --   --   HCT 38.7  --   --   PLT 198  --   --   LABPROT 25.7* 27.1* 34.2*  INR 2.30 2.46 3.29  CREATININE 0.76 0.79  --     Estimated Creatinine Clearance: 50 mL/min (by C-G formula based on SCr of 0.79 mg/dL).  Assessment: 63 yoF with Afib and Hx rheumatic heart disease with MVR and AVR on warfarin, HTN, DM, dCHF, admitted with back pain.  INR noted to be 2.5-3.   Patient with recent hematoma and kyphoplasty, for which she was bridged with Lovenox.  INR < 2 on admit.  MD note hematoma stable.  Enoxaparin until INR at goal, per MD note.  PTA warfarin dose reported as 3 mg daily except takes 1.5 mg on Mondays; INR was 2.2 at anti-coag clinic visit on 4/23 - with new INR goal, patient likely to new higher dose at discharge.  Today, 10/24/2016: - INR therapeutic at 3.29 (goal 3-3.5), large 7.1 second jump in protime - could reflect the total of 7 mg given 4/26 (4+3)  - Hgb, platelets WNL/stable (4/28) - No bleeding reported (breast hematoma noted to be stable) - No significant drug interactions with warfarin noted   Goal of Therapy:  INR 3-3.5 per Cardiology (Dr. Debara Pickett) Monitor platelets by anticoagulation protocol: Yes   Plan:  DC LMWH - INR therapeutic Warfarin 2 mg at 1800 Daily INR  Eudelia Bunch, Pharm.D. [phone] 10/24/2016 10:03 AM

## 2016-10-24 NOTE — Care Management Note (Signed)
Case Management Note  Patient Details  Name: Courtney Evans MRN: [id number] Date of Birth: 1936/02/07  Subjective/Objective:                    Action/Plan:d/c home    Expected Discharge Date:  10/24/16               Expected Discharge Plan:  Home/Self Care  In-House Referral:     Discharge planning Services  CM Consult  Post Acute Care Choice:    Choice offered to:     DME Arranged:    DME Agency:     HH Arranged:    [place] Agency:     Status of Service:  Completed, signed off  If discussed at [employer] of Stay Meetings, dates discussed:    Additional Comments:  Dessa Phi, RN 10/24/2016, 11:02 AM

## 2016-10-28 DIAGNOSIS — I480 Paroxysmal atrial fibrillation: Secondary | ICD-10-CM | POA: Diagnosis not present

## 2016-10-28 DIAGNOSIS — Z7901 Long term (current) use of anticoagulants: Secondary | ICD-10-CM | POA: Diagnosis not present

## 2016-10-28 DIAGNOSIS — M62830 Muscle spasm of back: Secondary | ICD-10-CM | POA: Diagnosis not present

## 2016-10-28 DIAGNOSIS — I5032 Chronic diastolic (congestive) heart failure: Secondary | ICD-10-CM | POA: Diagnosis not present

## 2016-10-28 DIAGNOSIS — Z952 Presence of prosthetic heart valve: Secondary | ICD-10-CM | POA: Diagnosis not present

## 2016-10-28 DIAGNOSIS — Z9181 History of falling: Secondary | ICD-10-CM | POA: Diagnosis not present

## 2016-10-28 DIAGNOSIS — R5381 Other malaise: Secondary | ICD-10-CM | POA: Diagnosis not present

## 2016-10-31 DIAGNOSIS — I130 Hypertensive heart and chronic kidney disease with heart failure and stage 1 through stage 4 chronic kidney disease, or unspecified chronic kidney disease: Secondary | ICD-10-CM | POA: Diagnosis not present

## 2016-10-31 DIAGNOSIS — M62830 Muscle spasm of back: Secondary | ICD-10-CM | POA: Diagnosis not present

## 2016-10-31 DIAGNOSIS — I5032 Chronic diastolic (congestive) heart failure: Secondary | ICD-10-CM | POA: Diagnosis not present

## 2016-10-31 DIAGNOSIS — M419 Scoliosis, unspecified: Secondary | ICD-10-CM | POA: Diagnosis not present

## 2016-10-31 DIAGNOSIS — R296 Repeated falls: Secondary | ICD-10-CM | POA: Diagnosis not present

## 2016-10-31 DIAGNOSIS — M4000 Postural kyphosis, site unspecified: Secondary | ICD-10-CM | POA: Diagnosis not present

## 2016-10-31 DIAGNOSIS — E119 Type 2 diabetes mellitus without complications: Secondary | ICD-10-CM | POA: Diagnosis not present

## 2016-10-31 DIAGNOSIS — I480 Paroxysmal atrial fibrillation: Secondary | ICD-10-CM | POA: Diagnosis not present

## 2016-11-02 DIAGNOSIS — M62830 Muscle spasm of back: Secondary | ICD-10-CM | POA: Diagnosis not present

## 2016-11-02 DIAGNOSIS — M419 Scoliosis, unspecified: Secondary | ICD-10-CM | POA: Diagnosis not present

## 2016-11-02 DIAGNOSIS — E119 Type 2 diabetes mellitus without complications: Secondary | ICD-10-CM | POA: Diagnosis not present

## 2016-11-02 DIAGNOSIS — I130 Hypertensive heart and chronic kidney disease with heart failure and stage 1 through stage 4 chronic kidney disease, or unspecified chronic kidney disease: Secondary | ICD-10-CM | POA: Diagnosis not present

## 2016-11-02 DIAGNOSIS — I5032 Chronic diastolic (congestive) heart failure: Secondary | ICD-10-CM | POA: Diagnosis not present

## 2016-11-02 DIAGNOSIS — M4000 Postural kyphosis, site unspecified: Secondary | ICD-10-CM | POA: Diagnosis not present

## 2016-11-08 DIAGNOSIS — E119 Type 2 diabetes mellitus without complications: Secondary | ICD-10-CM | POA: Diagnosis not present

## 2016-11-08 DIAGNOSIS — I130 Hypertensive heart and chronic kidney disease with heart failure and stage 1 through stage 4 chronic kidney disease, or unspecified chronic kidney disease: Secondary | ICD-10-CM | POA: Diagnosis not present

## 2016-11-08 DIAGNOSIS — M419 Scoliosis, unspecified: Secondary | ICD-10-CM | POA: Diagnosis not present

## 2016-11-08 DIAGNOSIS — M62830 Muscle spasm of back: Secondary | ICD-10-CM | POA: Diagnosis not present

## 2016-11-08 DIAGNOSIS — I5032 Chronic diastolic (congestive) heart failure: Secondary | ICD-10-CM | POA: Diagnosis not present

## 2016-11-08 DIAGNOSIS — M4000 Postural kyphosis, site unspecified: Secondary | ICD-10-CM | POA: Diagnosis not present

## 2016-11-09 DIAGNOSIS — M419 Scoliosis, unspecified: Secondary | ICD-10-CM | POA: Diagnosis not present

## 2016-11-09 DIAGNOSIS — I130 Hypertensive heart and chronic kidney disease with heart failure and stage 1 through stage 4 chronic kidney disease, or unspecified chronic kidney disease: Secondary | ICD-10-CM | POA: Diagnosis not present

## 2016-11-09 DIAGNOSIS — E119 Type 2 diabetes mellitus without complications: Secondary | ICD-10-CM | POA: Diagnosis not present

## 2016-11-09 DIAGNOSIS — M4000 Postural kyphosis, site unspecified: Secondary | ICD-10-CM | POA: Diagnosis not present

## 2016-11-09 DIAGNOSIS — I5032 Chronic diastolic (congestive) heart failure: Secondary | ICD-10-CM | POA: Diagnosis not present

## 2016-11-09 DIAGNOSIS — M62830 Muscle spasm of back: Secondary | ICD-10-CM | POA: Diagnosis not present

## 2016-11-14 DIAGNOSIS — Z7901 Long term (current) use of anticoagulants: Secondary | ICD-10-CM | POA: Diagnosis not present

## 2016-11-15 DIAGNOSIS — E119 Type 2 diabetes mellitus without complications: Secondary | ICD-10-CM | POA: Diagnosis not present

## 2016-11-15 DIAGNOSIS — I130 Hypertensive heart and chronic kidney disease with heart failure and stage 1 through stage 4 chronic kidney disease, or unspecified chronic kidney disease: Secondary | ICD-10-CM | POA: Diagnosis not present

## 2016-11-15 DIAGNOSIS — I5032 Chronic diastolic (congestive) heart failure: Secondary | ICD-10-CM | POA: Diagnosis not present

## 2016-11-15 DIAGNOSIS — M4000 Postural kyphosis, site unspecified: Secondary | ICD-10-CM | POA: Diagnosis not present

## 2016-11-15 DIAGNOSIS — M62830 Muscle spasm of back: Secondary | ICD-10-CM | POA: Diagnosis not present

## 2016-11-15 DIAGNOSIS — M419 Scoliosis, unspecified: Secondary | ICD-10-CM | POA: Diagnosis not present

## 2016-11-16 DIAGNOSIS — M419 Scoliosis, unspecified: Secondary | ICD-10-CM | POA: Diagnosis not present

## 2016-11-16 DIAGNOSIS — M4000 Postural kyphosis, site unspecified: Secondary | ICD-10-CM | POA: Diagnosis not present

## 2016-11-16 DIAGNOSIS — I130 Hypertensive heart and chronic kidney disease with heart failure and stage 1 through stage 4 chronic kidney disease, or unspecified chronic kidney disease: Secondary | ICD-10-CM | POA: Diagnosis not present

## 2016-11-16 DIAGNOSIS — M62830 Muscle spasm of back: Secondary | ICD-10-CM | POA: Diagnosis not present

## 2016-11-16 DIAGNOSIS — E119 Type 2 diabetes mellitus without complications: Secondary | ICD-10-CM | POA: Diagnosis not present

## 2016-11-16 DIAGNOSIS — I5032 Chronic diastolic (congestive) heart failure: Secondary | ICD-10-CM | POA: Diagnosis not present

## 2016-11-17 DIAGNOSIS — M419 Scoliosis, unspecified: Secondary | ICD-10-CM | POA: Diagnosis not present

## 2016-11-17 DIAGNOSIS — M62830 Muscle spasm of back: Secondary | ICD-10-CM | POA: Diagnosis not present

## 2016-11-17 DIAGNOSIS — M4000 Postural kyphosis, site unspecified: Secondary | ICD-10-CM | POA: Diagnosis not present

## 2016-11-17 DIAGNOSIS — I130 Hypertensive heart and chronic kidney disease with heart failure and stage 1 through stage 4 chronic kidney disease, or unspecified chronic kidney disease: Secondary | ICD-10-CM | POA: Diagnosis not present

## 2016-11-17 DIAGNOSIS — I5032 Chronic diastolic (congestive) heart failure: Secondary | ICD-10-CM | POA: Diagnosis not present

## 2016-11-17 DIAGNOSIS — E119 Type 2 diabetes mellitus without complications: Secondary | ICD-10-CM | POA: Diagnosis not present

## 2016-11-18 DIAGNOSIS — I130 Hypertensive heart and chronic kidney disease with heart failure and stage 1 through stage 4 chronic kidney disease, or unspecified chronic kidney disease: Secondary | ICD-10-CM | POA: Diagnosis not present

## 2016-11-18 DIAGNOSIS — M62830 Muscle spasm of back: Secondary | ICD-10-CM | POA: Diagnosis not present

## 2016-11-18 DIAGNOSIS — I5032 Chronic diastolic (congestive) heart failure: Secondary | ICD-10-CM | POA: Diagnosis not present

## 2016-11-18 DIAGNOSIS — M4000 Postural kyphosis, site unspecified: Secondary | ICD-10-CM | POA: Diagnosis not present

## 2016-11-18 DIAGNOSIS — E119 Type 2 diabetes mellitus without complications: Secondary | ICD-10-CM | POA: Diagnosis not present

## 2016-11-18 DIAGNOSIS — M419 Scoliosis, unspecified: Secondary | ICD-10-CM | POA: Diagnosis not present

## 2016-11-23 DIAGNOSIS — M4000 Postural kyphosis, site unspecified: Secondary | ICD-10-CM | POA: Diagnosis not present

## 2016-11-23 DIAGNOSIS — I130 Hypertensive heart and chronic kidney disease with heart failure and stage 1 through stage 4 chronic kidney disease, or unspecified chronic kidney disease: Secondary | ICD-10-CM | POA: Diagnosis not present

## 2016-11-23 DIAGNOSIS — M62830 Muscle spasm of back: Secondary | ICD-10-CM | POA: Diagnosis not present

## 2016-11-23 DIAGNOSIS — M419 Scoliosis, unspecified: Secondary | ICD-10-CM | POA: Diagnosis not present

## 2016-11-23 DIAGNOSIS — I5032 Chronic diastolic (congestive) heart failure: Secondary | ICD-10-CM | POA: Diagnosis not present

## 2016-11-23 DIAGNOSIS — E119 Type 2 diabetes mellitus without complications: Secondary | ICD-10-CM | POA: Diagnosis not present

## 2016-12-13 DIAGNOSIS — E559 Vitamin D deficiency, unspecified: Secondary | ICD-10-CM | POA: Diagnosis not present

## 2016-12-13 DIAGNOSIS — E782 Mixed hyperlipidemia: Secondary | ICD-10-CM | POA: Diagnosis not present

## 2016-12-13 DIAGNOSIS — E119 Type 2 diabetes mellitus without complications: Secondary | ICD-10-CM | POA: Diagnosis not present

## 2016-12-13 DIAGNOSIS — Z7901 Long term (current) use of anticoagulants: Secondary | ICD-10-CM | POA: Diagnosis not present

## 2016-12-13 DIAGNOSIS — F324 Major depressive disorder, single episode, in partial remission: Secondary | ICD-10-CM | POA: Diagnosis not present

## 2016-12-13 DIAGNOSIS — H26492 Other secondary cataract, left eye: Secondary | ICD-10-CM | POA: Diagnosis not present

## 2016-12-13 DIAGNOSIS — Z7984 Long term (current) use of oral hypoglycemic drugs: Secondary | ICD-10-CM | POA: Diagnosis not present

## 2016-12-13 DIAGNOSIS — I10 Essential (primary) hypertension: Secondary | ICD-10-CM | POA: Diagnosis not present

## 2016-12-13 DIAGNOSIS — D62 Acute posthemorrhagic anemia: Secondary | ICD-10-CM | POA: Diagnosis not present

## 2016-12-13 DIAGNOSIS — M810 Age-related osteoporosis without current pathological fracture: Secondary | ICD-10-CM | POA: Diagnosis not present

## 2016-12-13 DIAGNOSIS — I480 Paroxysmal atrial fibrillation: Secondary | ICD-10-CM | POA: Diagnosis not present

## 2017-01-01 DIAGNOSIS — S20229A Contusion of unspecified back wall of thorax, initial encounter: Secondary | ICD-10-CM | POA: Diagnosis not present

## 2017-01-02 DIAGNOSIS — Z7901 Long term (current) use of anticoagulants: Secondary | ICD-10-CM | POA: Diagnosis not present

## 2017-01-20 DIAGNOSIS — Z952 Presence of prosthetic heart valve: Secondary | ICD-10-CM | POA: Diagnosis not present

## 2017-01-20 DIAGNOSIS — Z7901 Long term (current) use of anticoagulants: Secondary | ICD-10-CM | POA: Diagnosis not present

## 2017-01-20 DIAGNOSIS — F3342 Major depressive disorder, recurrent, in full remission: Secondary | ICD-10-CM | POA: Diagnosis not present

## 2017-01-20 DIAGNOSIS — M810 Age-related osteoporosis without current pathological fracture: Secondary | ICD-10-CM | POA: Diagnosis not present

## 2017-01-25 ENCOUNTER — Ambulatory Visit: Payer: Self-pay | Admitting: Pharmacist Clinician (PhC)/ Clinical Pharmacy Specialist

## 2017-01-25 DIAGNOSIS — I482 Chronic atrial fibrillation, unspecified: Secondary | ICD-10-CM

## 2017-01-25 DIAGNOSIS — Z952 Presence of prosthetic heart valve: Secondary | ICD-10-CM

## 2017-01-30 ENCOUNTER — Other Ambulatory Visit: Payer: Self-pay | Admitting: Obstetrics and Gynecology

## 2017-01-30 DIAGNOSIS — N6320 Unspecified lump in the left breast, unspecified quadrant: Secondary | ICD-10-CM

## 2017-02-03 DIAGNOSIS — Z7901 Long term (current) use of anticoagulants: Secondary | ICD-10-CM | POA: Diagnosis not present

## 2017-02-03 DIAGNOSIS — I480 Paroxysmal atrial fibrillation: Secondary | ICD-10-CM | POA: Diagnosis not present

## 2017-02-06 DIAGNOSIS — Z960 Presence of urogenital implants: Secondary | ICD-10-CM | POA: Diagnosis not present

## 2017-02-06 DIAGNOSIS — N814 Uterovaginal prolapse, unspecified: Secondary | ICD-10-CM | POA: Diagnosis not present

## 2017-02-06 DIAGNOSIS — N8110 Cystocele, unspecified: Secondary | ICD-10-CM | POA: Diagnosis not present

## 2017-02-10 DIAGNOSIS — I480 Paroxysmal atrial fibrillation: Secondary | ICD-10-CM | POA: Diagnosis not present

## 2017-02-10 DIAGNOSIS — Z7901 Long term (current) use of anticoagulants: Secondary | ICD-10-CM | POA: Diagnosis not present

## 2017-02-17 DIAGNOSIS — Z952 Presence of prosthetic heart valve: Secondary | ICD-10-CM | POA: Diagnosis not present

## 2017-02-17 DIAGNOSIS — Z7901 Long term (current) use of anticoagulants: Secondary | ICD-10-CM | POA: Diagnosis not present

## 2017-03-17 DIAGNOSIS — Z7901 Long term (current) use of anticoagulants: Secondary | ICD-10-CM | POA: Diagnosis not present

## 2017-03-17 DIAGNOSIS — I480 Paroxysmal atrial fibrillation: Secondary | ICD-10-CM | POA: Diagnosis not present

## 2017-03-17 DIAGNOSIS — Z952 Presence of prosthetic heart valve: Secondary | ICD-10-CM | POA: Diagnosis not present

## 2017-03-20 DIAGNOSIS — H26492 Other secondary cataract, left eye: Secondary | ICD-10-CM | POA: Diagnosis not present

## 2017-03-29 DIAGNOSIS — H353121 Nonexudative age-related macular degeneration, left eye, early dry stage: Secondary | ICD-10-CM | POA: Diagnosis not present

## 2017-03-31 ENCOUNTER — Other Ambulatory Visit: Payer: Medicare Other

## 2017-04-21 ENCOUNTER — Ambulatory Visit: Payer: Medicare Other

## 2017-04-21 ENCOUNTER — Ambulatory Visit
Admission: RE | Admit: 2017-04-21 | Discharge: 2017-04-21 | Disposition: A | Discharge status: Home / Self Care | Payer: Medicare Other | Source: Ambulatory Visit | Attending: Obstetrics and Gynecology | Admitting: Obstetrics and Gynecology

## 2017-04-21 DIAGNOSIS — Z7901 Long term (current) use of anticoagulants: Secondary | ICD-10-CM | POA: Diagnosis not present

## 2017-04-21 DIAGNOSIS — R928 Other abnormal and inconclusive findings on diagnostic imaging of breast: Secondary | ICD-10-CM | POA: Diagnosis not present

## 2017-04-21 DIAGNOSIS — H04123 Dry eye syndrome of bilateral lacrimal glands: Secondary | ICD-10-CM | POA: Diagnosis not present

## 2017-04-21 DIAGNOSIS — N6320 Unspecified lump in the left breast, unspecified quadrant: Secondary | ICD-10-CM

## 2017-04-21 DIAGNOSIS — I480 Paroxysmal atrial fibrillation: Secondary | ICD-10-CM | POA: Diagnosis not present

## 2017-05-05 DIAGNOSIS — Z7901 Long term (current) use of anticoagulants: Secondary | ICD-10-CM | POA: Diagnosis not present

## 2017-05-05 DIAGNOSIS — Z23 Encounter for immunization: Secondary | ICD-10-CM | POA: Diagnosis not present

## 2017-05-08 ENCOUNTER — Emergency Department (HOSPITAL_COMMUNITY)
Admission: EM | Admit: 2017-05-08 | Discharge: 2017-05-08 | Disposition: A | Discharge status: Home / Self Care | Payer: Medicare Other | Attending: Emergency Medicine | Admitting: Emergency Medicine

## 2017-05-08 ENCOUNTER — Emergency Department (HOSPITAL_COMMUNITY): Payer: Medicare Other

## 2017-05-08 ENCOUNTER — Other Ambulatory Visit: Payer: Self-pay

## 2017-05-08 ENCOUNTER — Encounter (HOSPITAL_COMMUNITY): Payer: Self-pay

## 2017-05-08 DIAGNOSIS — I2510 Atherosclerotic heart disease of native coronary artery without angina pectoris: Secondary | ICD-10-CM | POA: Insufficient documentation

## 2017-05-08 DIAGNOSIS — Z7984 Long term (current) use of oral hypoglycemic drugs: Secondary | ICD-10-CM | POA: Insufficient documentation

## 2017-05-08 DIAGNOSIS — M545 Low back pain, unspecified: Secondary | ICD-10-CM

## 2017-05-08 DIAGNOSIS — I10 Essential (primary) hypertension: Secondary | ICD-10-CM | POA: Diagnosis not present

## 2017-05-08 DIAGNOSIS — Z7901 Long term (current) use of anticoagulants: Secondary | ICD-10-CM | POA: Insufficient documentation

## 2017-05-08 DIAGNOSIS — Z79899 Other long term (current) drug therapy: Secondary | ICD-10-CM | POA: Insufficient documentation

## 2017-05-08 DIAGNOSIS — Z950 Presence of cardiac pacemaker: Secondary | ICD-10-CM | POA: Diagnosis not present

## 2017-05-08 DIAGNOSIS — M62838 Other muscle spasm: Secondary | ICD-10-CM

## 2017-05-08 DIAGNOSIS — I509 Heart failure, unspecified: Secondary | ICD-10-CM | POA: Insufficient documentation

## 2017-05-08 DIAGNOSIS — D649 Anemia, unspecified: Secondary | ICD-10-CM | POA: Diagnosis not present

## 2017-05-08 DIAGNOSIS — M549 Dorsalgia, unspecified: Secondary | ICD-10-CM | POA: Diagnosis not present

## 2017-05-08 DIAGNOSIS — I110 Hypertensive heart disease with heart failure: Secondary | ICD-10-CM | POA: Diagnosis not present

## 2017-05-08 DIAGNOSIS — E119 Type 2 diabetes mellitus without complications: Secondary | ICD-10-CM | POA: Insufficient documentation

## 2017-05-08 DIAGNOSIS — M62830 Muscle spasm of back: Secondary | ICD-10-CM | POA: Insufficient documentation

## 2017-05-08 DIAGNOSIS — S32000A Wedge compression fracture of unspecified lumbar vertebra, initial encounter for closed fracture: Secondary | ICD-10-CM | POA: Diagnosis not present

## 2017-05-08 MED ORDER — DOCUSATE SODIUM 100 MG PO CAPS: 100.0000 mg | ORAL_CAPSULE | Freq: Two times a day (BID) | ORAL | 0 refills | Status: DC

## 2017-05-08 MED ORDER — CYCLOBENZAPRINE HCL 10 MG PO TABS
5.0000 mg | ORAL_TABLET | Freq: Once | ORAL | Status: AC
  Administered 2017-05-08: 5 mg via ORAL
  Filled 2017-05-08: qty 1

## 2017-05-08 MED ORDER — IBUPROFEN 400 MG PO TABS: 400.0000 mg | ORAL_TABLET | Freq: Three times a day (TID) | ORAL | 0 refills | Status: DC | PRN

## 2017-05-08 MED ORDER — OXYCODONE-ACETAMINOPHEN 5-325 MG PO TABS
1.0000 | ORAL_TABLET | Freq: Once | ORAL | Status: AC
  Administered 2017-05-08: 1 via ORAL
  Filled 2017-05-08: qty 1

## 2017-05-08 MED ORDER — HYDROCODONE-ACETAMINOPHEN 5-325 MG PO TABS: 1.0000 | ORAL_TABLET | ORAL | 0 refills | Status: DC | PRN

## 2017-05-08 MED ORDER — IBUPROFEN 200 MG PO TABS
600.0000 mg | ORAL_TABLET | Freq: Once | ORAL | Status: DC
  Filled 2017-05-08: qty 3

## 2017-05-08 NOTE — ED Notes (Signed)
Pt stated she had to use the restroom. Pt was placed on purewic cath.

## 2017-05-08 NOTE — ED Notes (Signed)
Bed: WA01 Expected date:  Expected time:  Means of arrival:  Comments: EMS-back pain

## 2017-05-08 NOTE — ED Provider Notes (Signed)
[facility] DEPT Provider Note   CSN: [id number] Arrival date & time: 05/08/17  1553     History   Chief Complaint Chief Complaint  Patient presents with  . Back Pain    HPI Courtney Evans is a 81 y.o. female.  HPI Patient is a 81 year old female presents the emergency department with back pain and back spasm.  She states that she has had several fractures in her back before but has had no recent injury or trauma.  She states that she awoke this morning and seemed to have a catch in her back.  She denies abdominal pain.  No fevers or chills.  Denies chest pain.  No weakness of her arms or legs.  No other complaints.  She has tried Tylenol for her pain with some improvement but not complete.  She feels as though there is muscle spasm on the left side.   Past Medical History:  Diagnosis Date  . A-fib ([place])   . Compression fracture   . Coronary artery disease   . Diabetes mellitus without complication ([place])   . Hyperlipidemia   . Hypertension   . Pacemaker     Patient Active Problem List   Diagnosis Date Noted  . Subtherapeutic international normalized ratio (INR)   . Back pain 10/19/2016  . Hypoxia 10/19/2016  . Type 2 diabetes mellitus without complication ([place]) 08/65/7846  . Acute on chronic diastolic CHF (congestive heart failure) ([place]) 10/19/2016  . Breast hematoma 10/19/2016  . Anemia 08/26/2016  . Rheumatic heart disease 02/19/2016  . LBBB (left bundle branch block) 02/19/2016  . Pneumonia 11/19/2015  . Sepsis due to pneumonia ([place]) 11/19/2015  . CAP (community acquired pneumonia) 11/19/2015  . Coagulopathy ([place]) 11/19/2015  . Chronic atrial fibrillation ([place]) 11/19/2015  . Lactic acidosis 11/19/2015  . Mechanical heart valve present 11/19/2015    Past Surgical History:  Procedure Laterality Date  . AORTIC AND MITRAL VALVE REPLACEMENT  1994   w/ mechanical valves at [facility]  . BACK SURGERY    . CARDIAC SURGERY    . KYPHOPLASTY   2011   1 - at [facility]  . KYPHOPLASTY  2012   2 - at [facility]  . KYPHOPLASTY  2013   1 - at [facility]  . KYPHOPLASTY  05/2015   1 - at [facility]  . PACEMAKER GENERATOR CHANGE  2013  . [facility] [place]    [place] History    No data available       Home Medications    Prior to Admission medications   Medication Sig Start Date End Date Taking? Authorizing Provider  acetaminophen (TYLENOL) 500 MG tablet Take 500-1,000 mg by mouth every 6 (six) hours as needed for mild pain, moderate pain, fever or headache.    [provider]  amLODipine (NORVASC) 5 MG tablet Take 5 mg by mouth daily.     [provider]  atorvastatin (LIPITOR) 20 MG tablet Take 20 mg by mouth daily.     [provider]  cholecalciferol (VITAMIN D) 1000 units tablet Take 2,000 Units by mouth daily.     [provider]  docusate sodium (COLACE) 100 MG capsule Take 1 capsule (100 mg total) every 12 (twelve) hours by mouth. 05/08/17   Jola Schmidt, MD  HYDROcodone-acetaminophen (NORCO/VICODIN) 5-325 MG tablet Take 1 tablet  every 4 (four) hours as needed by mouth for moderate pain. 05/08/17   Jola Schmidt, MD  ibuprofen (ADVIL,MOTRIN) 400 MG tablet Take 1 tablet (400 mg total) every 8 (eight) hours as needed by mouth. 05/08/17   Jola Schmidt, MD  metFORMIN (GLUCOPHAGE) 500 MG tablet Take 500 mg by mouth 2 (two) times daily with a meal.     [provider]  nitroGLYCERIN (NITROSTAT) 0.4 MG SL tablet Place 0.4 mg under the tongue every 5 (five) minutes as needed for chest pain.    [provider]  polyethylene glycol (MIRALAX / GLYCOLAX) packet Take 17 g by mouth daily as needed for mild constipation.    [provider]  warfarin (COUMADIN) 3 MG tablet Take 1.5-3 mg by mouth every evening. Pt takes one-half tablet  on Monday and one tablet all other days.    [provider]    Family History Family History  Problem Relation Age of Onset  . Heart attack Mother   . Stroke Father     Social History Social History   Tobacco Use  . Smoking status: Never Smoker  . Smokeless tobacco: Never Used  Substance Use Topics  . Alcohol use: No  . Drug use: No     Allergies   Codeine   Review of Systems Review of Systems  All other systems reviewed and are negative.    Physical Exam Updated Vital Signs BP (!) 161/76   Pulse 63   Temp 98 F (36.7 C) (Oral)   Resp 18   SpO2 91%   Physical Exam  Constitutional: She is oriented to person, place, and time. She appears well-developed and well-nourished.  HENT:  Head: Normocephalic.  Eyes: EOM are normal.  Neck: Normal range of motion.  Pulmonary/Chest: Effort normal.  Abdominal: She exhibits no distension.  Musculoskeletal: Normal range of motion.  Mild thoracic and lumbar tenderness with parathoracic and paralumbar tenderness on the left with mild spasm of the paralumbar muscles on the left.  No rash noted.  5 out of 5 strength in bilateral lower extremity major muscle groups.  Full range of motion of bilateral hips and knees and ankles.  Neurological: She is alert and oriented to person, place, and time.  Psychiatric: She has a normal mood and affect.  Nursing note and vitals reviewed.    ED Treatments / Results  Labs (all labs ordered are listed, but only abnormal results are displayed) Labs Reviewed - No data to display  EKG  EKG Interpretation None       Radiology Dg Thoracic Spine 2 View  Result Date: 05/08/2017 CLINICAL DATA:  81 year old female with intermittent lower back pain. No injury. Initial encounter. EXAM: THORACIC SPINE 2 VIEWS COMPARISON:  10/19/2016 chest CT. 09/28/2016 thoracic spine plain film examination. 09/17/2016 lumbar spine CT. FINDINGS: Thoracic kyphosis with multiple compression fractures  some of which been treated with cement augmentation (T9, T10, T11 and T12) appear similar to prior exams. This includes compression fracture T2, T5, T6, T7, T8, T9, T10, T11 and T12 with most notable compression fractures at the T5 and T12 level. Slight retropulsion at the T12 level. No new thoracic spine compression fractures noted. Compression fractures L2, L3 and L4 with cement augmentation L2 and L4 level. Pacemaker in place. Vascular calcifications. IMPRESSION: Multiple thoracic and lumbar compression fractures (some treated with cement augmentation), appear relatively similar to prior exams without new fracture identified. If further delineation were clinically desired, CT imaging may then be considered as patient  cannot have MR given the presence of pacemaker. Electronically Signed   By: Genia Del M.D.   On: 05/08/2017 17:21   Dg Lumbar Spine Complete  Result Date: 05/08/2017 CLINICAL DATA:  Back pain. EXAM: LUMBAR SPINE - COMPLETE 4+ VIEW COMPARISON:  CT, 09/17/2016 FINDINGS: There multiple compression fractures. Mild wedge-shaped deformity of T9. Moderate compression fracture of T10. Moderate compression fracture of T11, treated previously with vertebroplasty. Mild compression fracture of T12 treated previously with vertebroplasty. Moderate compression fracture of L1 previously treated with vertebroplasty. Mild to moderate compression fracture of L3 and moderate compression fracture of L5 also treated previously. Moderate compression fracture of L4. L2 remains normal in height. It should be noted that current labeling is different than the CT scan. The lowest rib-bearing vertebra what is labeled T12 with the current exam, which was labeled T11 on the prior CT scan. These fractures are all chronic, unchanged from the prior CT and prior thoracic spine radiographs dated 09/28/2016. No bone lesions. Bones diffusely demineralized. No spondylolisthesis. Mild loss of disc height from the lower thoracic  spine through L4-L5. Soft tissues are unremarkable. IMPRESSION: 1. No acute fractures. 2. Multiple old fractures many of which have been previously treated with vertebroplasty. Electronically Signed   By: Lajean Manes M.D.   On: 05/08/2017 17:10    Procedures Procedures (including critical care time)  Medications Ordered in ED Medications  ibuprofen (ADVIL,MOTRIN) tablet 600 mg (600 mg Oral Refused 05/08/17 1713)  oxyCODONE-acetaminophen (PERCOCET/ROXICET) 5-325 MG per tablet 1 tablet (1 tablet Oral Given 05/08/17 1716)  cyclobenzaprine (FLEXERIL) tablet 5 mg (5 mg Oral Given 05/08/17 1713)     Initial Impression / Assessment and Plan / ED Course  I have reviewed the triage vital signs and the nursing notes.  Pertinent labs & imaging results that were available during my care of the patient were reviewed by me and considered in my medical decision making (see chart for details).     No new osseous injury noted on plain films.  Abdominal exam is benign.  Pain is improved.  Recommended a short course of pain medication and anti-inflammatories as well as heat.  Patient was warned of the possibility of constipation with opioid pain medications.  Discharged home in good condition.  Primary care follow-up.  Patient understands return to the ER for new or worsening symptoms  Final Clinical Impressions(s) / ED Diagnoses   Final diagnoses:  Acute left-sided low back pain without sciatica  Muscle spasm    ED Discharge Orders        Ordered    HYDROcodone-acetaminophen (NORCO/VICODIN) 5-325 MG tablet  Every 4 hours PRN     05/08/17 1759    docusate sodium (COLACE) 100 MG capsule  Every 12 hours     05/08/17 1759    ibuprofen (ADVIL,MOTRIN) 400 MG tablet  Every 8 hours PRN     05/08/17 1800       Jola Schmidt, MD 05/08/17 1830

## 2017-05-08 NOTE — ED Triage Notes (Signed)
Patient brought in by PTAR from home with c/o intermittent back pain. Patient has history of Diabetes, HTN, and has a permanent pacemaker. Patient denies any other complaints.

## 2017-05-08 NOTE — ED Notes (Signed)
Patient's daughter and POA, Lattie Haw, called and updated on patient's POC. Patient's daughter to come and pick patient up.

## 2017-06-08 DIAGNOSIS — M62830 Muscle spasm of back: Secondary | ICD-10-CM | POA: Diagnosis not present

## 2017-06-08 DIAGNOSIS — Z7901 Long term (current) use of anticoagulants: Secondary | ICD-10-CM | POA: Diagnosis not present

## 2017-06-08 DIAGNOSIS — S93421A Sprain of deltoid ligament of right ankle, initial encounter: Secondary | ICD-10-CM | POA: Diagnosis not present

## 2017-06-08 DIAGNOSIS — Z952 Presence of prosthetic heart valve: Secondary | ICD-10-CM | POA: Diagnosis not present

## 2017-06-08 DIAGNOSIS — I480 Paroxysmal atrial fibrillation: Secondary | ICD-10-CM | POA: Diagnosis not present

## 2017-06-29 DIAGNOSIS — E119 Type 2 diabetes mellitus without complications: Secondary | ICD-10-CM | POA: Diagnosis not present

## 2017-06-29 DIAGNOSIS — N390 Urinary tract infection, site not specified: Secondary | ICD-10-CM | POA: Diagnosis not present

## 2017-06-29 DIAGNOSIS — I10 Essential (primary) hypertension: Secondary | ICD-10-CM | POA: Diagnosis not present

## 2017-06-29 DIAGNOSIS — K5900 Constipation, unspecified: Secondary | ICD-10-CM | POA: Diagnosis not present

## 2017-06-29 DIAGNOSIS — Z79899 Other long term (current) drug therapy: Secondary | ICD-10-CM | POA: Diagnosis not present

## 2017-06-29 DIAGNOSIS — R109 Unspecified abdominal pain: Secondary | ICD-10-CM | POA: Diagnosis not present

## 2017-07-07 DIAGNOSIS — N390 Urinary tract infection, site not specified: Secondary | ICD-10-CM | POA: Diagnosis not present

## 2017-07-07 DIAGNOSIS — Z7901 Long term (current) use of anticoagulants: Secondary | ICD-10-CM | POA: Diagnosis not present

## 2017-07-13 DIAGNOSIS — H43392 Other vitreous opacities, left eye: Secondary | ICD-10-CM | POA: Diagnosis not present

## 2017-07-27 DIAGNOSIS — E119 Type 2 diabetes mellitus without complications: Secondary | ICD-10-CM | POA: Diagnosis not present

## 2017-07-27 DIAGNOSIS — H31012 Macula scars of posterior pole (postinflammatory) (post-traumatic), left eye: Secondary | ICD-10-CM | POA: Diagnosis not present

## 2017-07-27 DIAGNOSIS — M810 Age-related osteoporosis without current pathological fracture: Secondary | ICD-10-CM | POA: Diagnosis not present

## 2017-07-27 DIAGNOSIS — N390 Urinary tract infection, site not specified: Secondary | ICD-10-CM | POA: Diagnosis not present

## 2017-07-27 DIAGNOSIS — Z7984 Long term (current) use of oral hypoglycemic drugs: Secondary | ICD-10-CM | POA: Diagnosis not present

## 2017-07-27 DIAGNOSIS — Z7901 Long term (current) use of anticoagulants: Secondary | ICD-10-CM | POA: Diagnosis not present

## 2017-07-27 DIAGNOSIS — I480 Paroxysmal atrial fibrillation: Secondary | ICD-10-CM | POA: Diagnosis not present

## 2017-07-27 DIAGNOSIS — F324 Major depressive disorder, single episode, in partial remission: Secondary | ICD-10-CM | POA: Diagnosis not present

## 2017-07-27 DIAGNOSIS — D62 Acute posthemorrhagic anemia: Secondary | ICD-10-CM | POA: Diagnosis not present

## 2017-07-27 DIAGNOSIS — E559 Vitamin D deficiency, unspecified: Secondary | ICD-10-CM | POA: Diagnosis not present

## 2017-07-27 DIAGNOSIS — I10 Essential (primary) hypertension: Secondary | ICD-10-CM | POA: Diagnosis not present

## 2017-07-27 DIAGNOSIS — E782 Mixed hyperlipidemia: Secondary | ICD-10-CM | POA: Diagnosis not present

## 2017-08-03 DIAGNOSIS — F324 Major depressive disorder, single episode, in partial remission: Secondary | ICD-10-CM | POA: Diagnosis not present

## 2017-08-03 DIAGNOSIS — M545 Low back pain: Secondary | ICD-10-CM | POA: Diagnosis not present

## 2017-08-03 DIAGNOSIS — I480 Paroxysmal atrial fibrillation: Secondary | ICD-10-CM | POA: Diagnosis not present

## 2017-08-03 DIAGNOSIS — I10 Essential (primary) hypertension: Secondary | ICD-10-CM | POA: Diagnosis not present

## 2017-08-03 DIAGNOSIS — E559 Vitamin D deficiency, unspecified: Secondary | ICD-10-CM | POA: Diagnosis not present

## 2017-08-03 DIAGNOSIS — E119 Type 2 diabetes mellitus without complications: Secondary | ICD-10-CM | POA: Diagnosis not present

## 2017-08-03 DIAGNOSIS — Z7901 Long term (current) use of anticoagulants: Secondary | ICD-10-CM | POA: Diagnosis not present

## 2017-08-03 DIAGNOSIS — E782 Mixed hyperlipidemia: Secondary | ICD-10-CM | POA: Diagnosis not present

## 2017-08-03 DIAGNOSIS — M810 Age-related osteoporosis without current pathological fracture: Secondary | ICD-10-CM | POA: Diagnosis not present

## 2017-08-03 IMAGING — CT CT CHEST W/ CM
2 of 4 series · 14 of 36 positions shown, 17 images · IV contrast (ISOVUE)
Comparison: Chest x-ray 11/20/2015

CLINICAL DATA: Worsening swelling and hematoma left breast, recent
left breast biopsy, decreased hemoglobin, question active bleeding
on Coumadin

EXAM:
CT CHEST WITH CONTRAST
TECHNIQUE: Multidetector CT imaging of the chest was performed during
intravenous contrast administration.
CONTRAST:  1 QEBZR0-HJJ IOPAMIDOL (QEBZR0-HJJ) INJECTION 61%

[Series 2: chest with st · axial · 0.68mm/px · z∈[+1433,+1629]mm · 11 of 116 slices shown, 14 images]
[im 9/116  mediastinal]
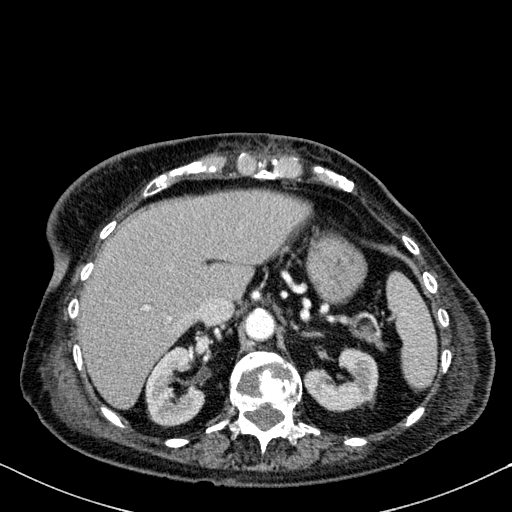
[im 9/116  lung]
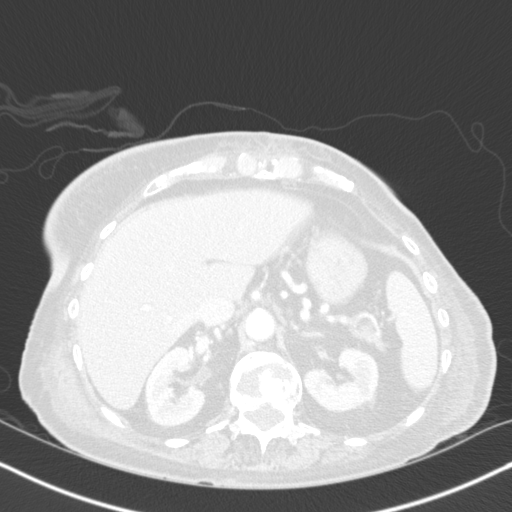
[im 18/116  lung]
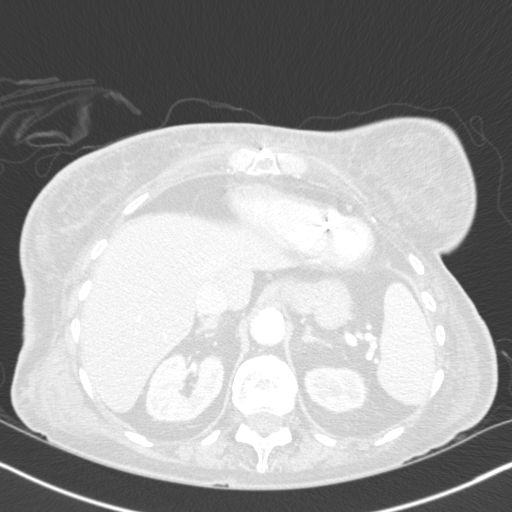
[im 27/116  lung]
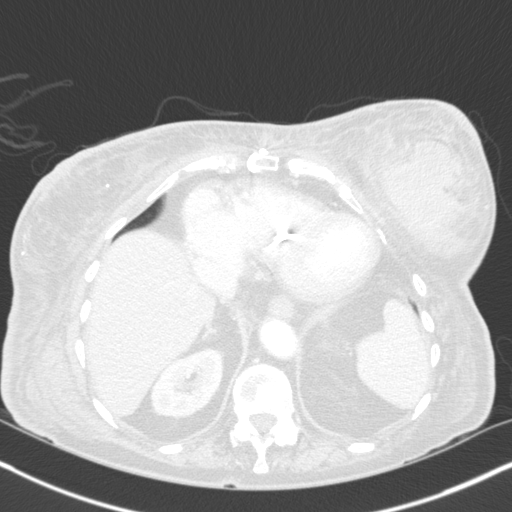
[im 36/116  lung]
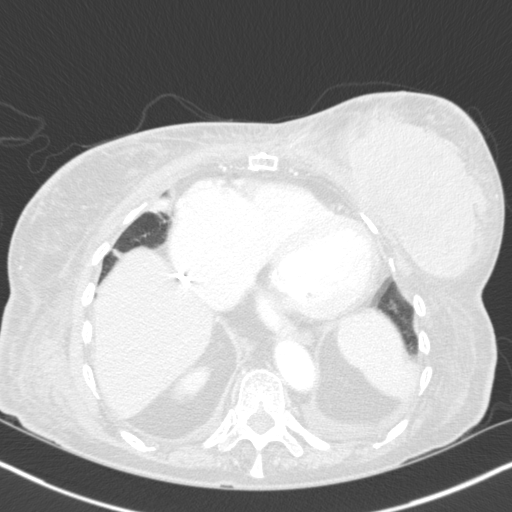
[im 45/116  mediastinal]
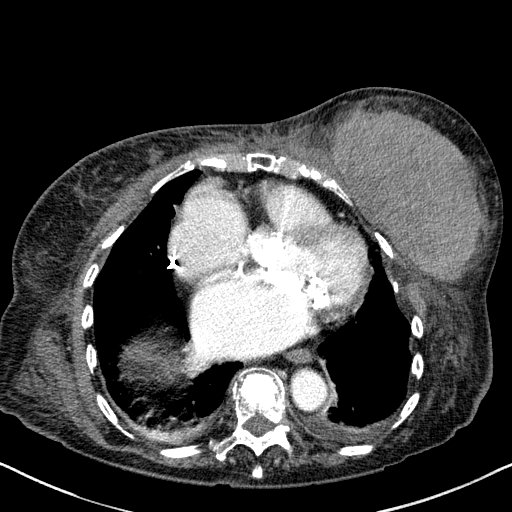
[im 45/116  lung]
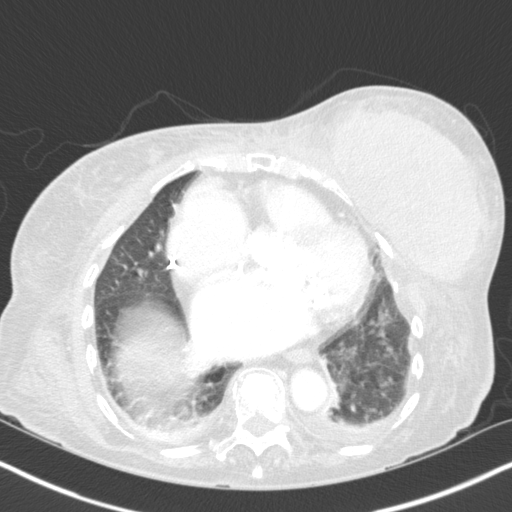
[im 62/116  lung]
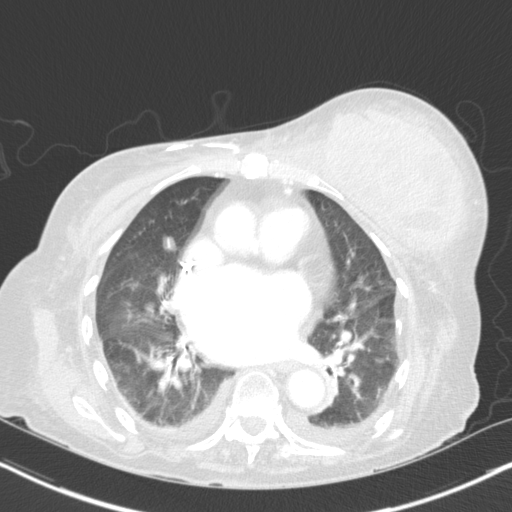
[im 71/116  lung]
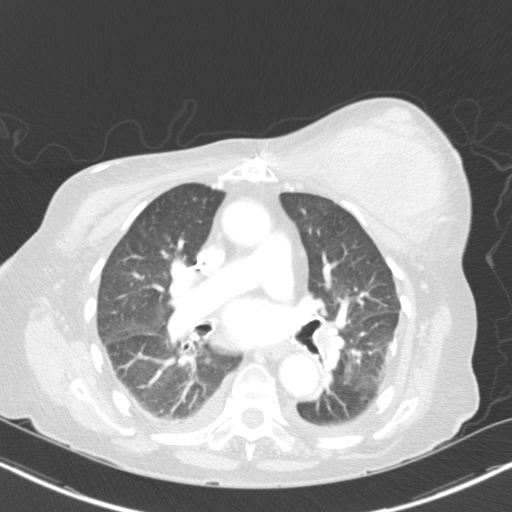
[im 80/116  lung]
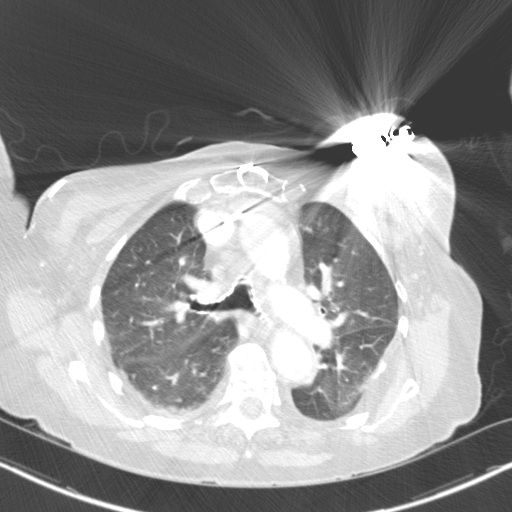
[im 89/116  mediastinal]
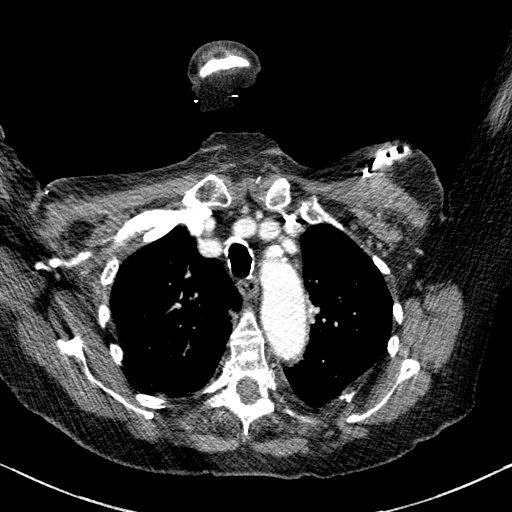
[im 89/116  lung]
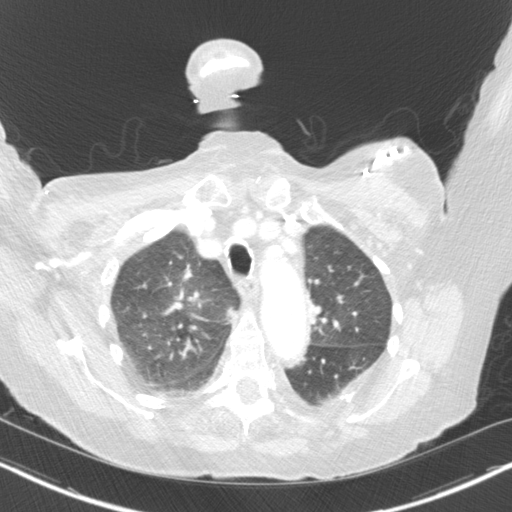
[im 98/116  lung]
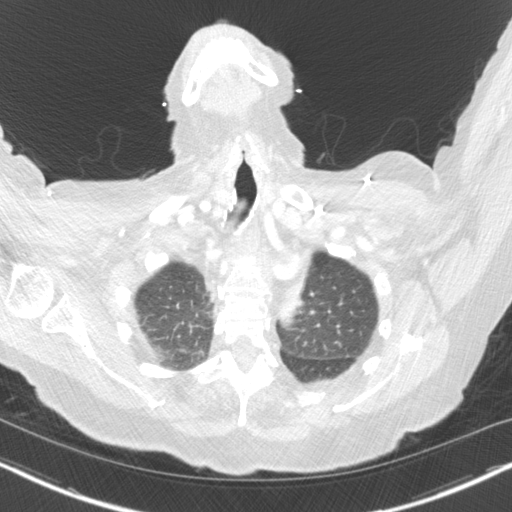
[im 107/116  lung]
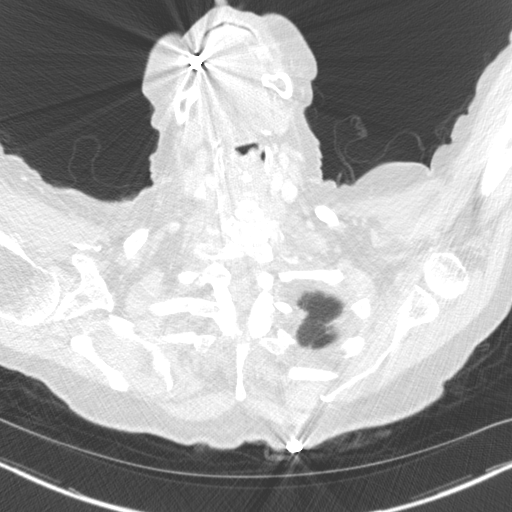

[Series 3: coronals · coronal · 0.58mm/px · 3 of 85 slices shown]
[im 17/85  lung]
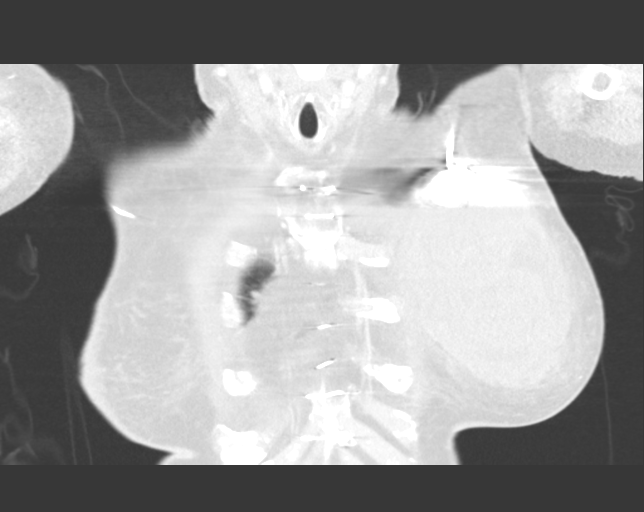
[im 34/85  lung]
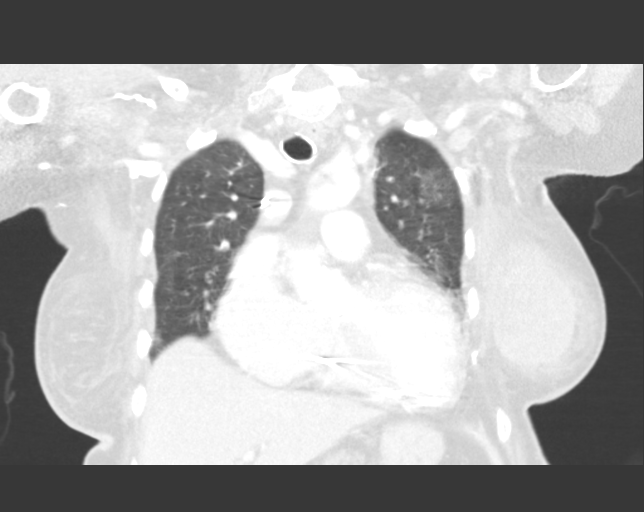
[im 51/85  lung]
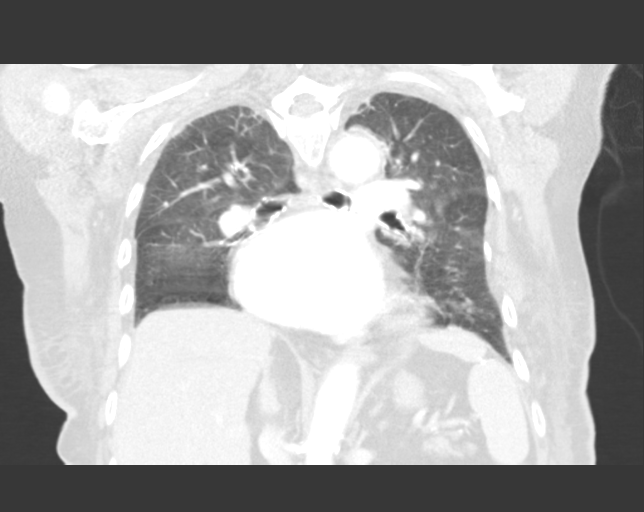

[14 of 36 positions shown; findings below may reference images not displayed]

FINDINGS: Cardiovascular: Cardiomegaly is noted. Mitral valve prosthesis.
Single lead cardiac pacemaker in place with tip in right ventricle.
No pericardial effusion. Atherosclerotic calcifications of thoracic
aorta. The patient is status post aortic valve replacement.

Mediastinum/Nodes: No mediastinal hematoma or adenopathy. No hilar
adenopathy. Central airways are patent.

Lungs/Pleura: There is bilateral small pleural effusion. Bilateral
lower lobe posterior atelectasis. No pulmonary edema. No segmental
infiltrates. The patient is status post median sternotomy. Mild
calcified pleural thickening in left upper lobe posteriorly see
axial image 31.

Upper Abdomen: The visualized upper abdomen shows no adrenal gland
mass. Visualized pancreas, spleen liver and upper kidneys are
unremarkable.

Musculoskeletal:

The patient is status post recent left breast biopsy. There is a
large hematoma with subacute blood products in left breast Measures
12.8 x 7.4 cm. On coronal image 22 the hematoma measures 11 cm
cranial caudally by 10 cm transverse diameter. There is no evidence
of active contrast extravasation to suggest active acute bleeding.
There is mild superior displacement/ mass effect of cardiac
pacemaker in left anterior chest wall. No acute rib fractures are
noted.

Sagittal images of the spine shows diffuse osteopenia. Multilevel
compression deformities thoracic spine. Multilevel prior
vertebroplasty mid and lower thoracic spine.
IMPRESSION: 1. The patient is status post recent left breast biopsy. There is a
large hematoma with subacute blood products in left breast Measures
12.8 x 7.4 cm. On coronal image 22 the hematoma measures 11 cm
cranial caudally by 10 cm transverse diameter. There is no evidence
of active contrast extravasation to suggest active acute bleeding.
There is mild superior displacement/ mass effect on cardiac
pacemaker in left anterior chest wall.
2. Bilateral small pleural effusion. Bilateral lower lobe posterior
atelectasis. No pulmonary edema. No segmental infiltrates.
3. Cardiomegaly. Status post aortic and mitral valve replacement.
Status post median sternotomy.
4. Multilevel compression deformities and prior vertebroplasty mid
and lower thoracic spine.

These results were called by telephone at the time of interpretation
on 08/26/2016 at [DATE] to Dr. TABASSUM CRAWLEY , who verbally
acknowledged these results.

## 2017-08-31 DIAGNOSIS — Z7901 Long term (current) use of anticoagulants: Secondary | ICD-10-CM | POA: Diagnosis not present

## 2017-08-31 DIAGNOSIS — I480 Paroxysmal atrial fibrillation: Secondary | ICD-10-CM | POA: Diagnosis not present

## 2017-09-14 DIAGNOSIS — R609 Edema, unspecified: Secondary | ICD-10-CM | POA: Diagnosis not present

## 2017-09-28 DIAGNOSIS — I480 Paroxysmal atrial fibrillation: Secondary | ICD-10-CM | POA: Diagnosis not present

## 2017-09-28 DIAGNOSIS — Z7901 Long term (current) use of anticoagulants: Secondary | ICD-10-CM | POA: Diagnosis not present

## 2017-09-28 DIAGNOSIS — Z952 Presence of prosthetic heart valve: Secondary | ICD-10-CM | POA: Diagnosis not present

## 2017-10-05 DIAGNOSIS — S22000A Wedge compression fracture of unspecified thoracic vertebra, initial encounter for closed fracture: Secondary | ICD-10-CM | POA: Diagnosis not present

## 2017-10-05 DIAGNOSIS — Z8781 Personal history of (healed) traumatic fracture: Secondary | ICD-10-CM | POA: Diagnosis not present

## 2017-10-05 DIAGNOSIS — I10 Essential (primary) hypertension: Secondary | ICD-10-CM | POA: Diagnosis not present

## 2017-10-05 DIAGNOSIS — Z6826 Body mass index (BMI) 26.0-26.9, adult: Secondary | ICD-10-CM | POA: Diagnosis not present

## 2017-10-12 DIAGNOSIS — Z7901 Long term (current) use of anticoagulants: Secondary | ICD-10-CM | POA: Diagnosis not present

## 2017-10-26 DIAGNOSIS — I480 Paroxysmal atrial fibrillation: Secondary | ICD-10-CM | POA: Diagnosis not present

## 2017-10-26 DIAGNOSIS — Z7901 Long term (current) use of anticoagulants: Secondary | ICD-10-CM | POA: Diagnosis not present

## 2017-10-26 DIAGNOSIS — Z85828 Personal history of other malignant neoplasm of skin: Secondary | ICD-10-CM | POA: Diagnosis not present

## 2017-10-26 DIAGNOSIS — Z954 Presence of other heart-valve replacement: Secondary | ICD-10-CM | POA: Diagnosis not present

## 2017-10-26 DIAGNOSIS — L821 Other seborrheic keratosis: Secondary | ICD-10-CM | POA: Diagnosis not present

## 2017-10-26 DIAGNOSIS — C4441 Basal cell carcinoma of skin of scalp and neck: Secondary | ICD-10-CM | POA: Diagnosis not present

## 2017-10-26 DIAGNOSIS — L820 Inflamed seborrheic keratosis: Secondary | ICD-10-CM | POA: Diagnosis not present

## 2017-10-26 DIAGNOSIS — D485 Neoplasm of uncertain behavior of skin: Secondary | ICD-10-CM | POA: Diagnosis not present

## 2017-10-26 DIAGNOSIS — L812 Freckles: Secondary | ICD-10-CM | POA: Diagnosis not present

## 2017-10-26 DIAGNOSIS — L570 Actinic keratosis: Secondary | ICD-10-CM | POA: Diagnosis not present

## 2017-10-26 DIAGNOSIS — C44519 Basal cell carcinoma of skin of other part of trunk: Secondary | ICD-10-CM | POA: Diagnosis not present

## 2017-11-10 DIAGNOSIS — M549 Dorsalgia, unspecified: Secondary | ICD-10-CM | POA: Diagnosis not present

## 2017-11-10 DIAGNOSIS — I10 Essential (primary) hypertension: Secondary | ICD-10-CM | POA: Diagnosis not present

## 2017-11-10 DIAGNOSIS — F3342 Major depressive disorder, recurrent, in full remission: Secondary | ICD-10-CM | POA: Diagnosis not present

## 2017-11-10 DIAGNOSIS — I480 Paroxysmal atrial fibrillation: Secondary | ICD-10-CM | POA: Diagnosis not present

## 2017-11-10 DIAGNOSIS — G8929 Other chronic pain: Secondary | ICD-10-CM | POA: Diagnosis not present

## 2017-11-10 DIAGNOSIS — E782 Mixed hyperlipidemia: Secondary | ICD-10-CM | POA: Diagnosis not present

## 2017-11-10 DIAGNOSIS — M810 Age-related osteoporosis without current pathological fracture: Secondary | ICD-10-CM | POA: Diagnosis not present

## 2017-11-10 DIAGNOSIS — Z7901 Long term (current) use of anticoagulants: Secondary | ICD-10-CM | POA: Diagnosis not present

## 2017-11-10 DIAGNOSIS — E119 Type 2 diabetes mellitus without complications: Secondary | ICD-10-CM | POA: Diagnosis not present

## 2017-11-22 DIAGNOSIS — N3090 Cystitis, unspecified without hematuria: Secondary | ICD-10-CM | POA: Diagnosis not present

## 2017-11-22 DIAGNOSIS — N3001 Acute cystitis with hematuria: Secondary | ICD-10-CM | POA: Diagnosis not present

## 2017-12-08 DIAGNOSIS — Z7901 Long term (current) use of anticoagulants: Secondary | ICD-10-CM | POA: Diagnosis not present

## 2017-12-08 DIAGNOSIS — I480 Paroxysmal atrial fibrillation: Secondary | ICD-10-CM | POA: Diagnosis not present

## 2018-01-05 ENCOUNTER — Encounter: Payer: Self-pay | Admitting: Cardiology

## 2018-01-05 DIAGNOSIS — Z7901 Long term (current) use of anticoagulants: Secondary | ICD-10-CM | POA: Diagnosis not present

## 2018-01-24 ENCOUNTER — Telehealth: Payer: Self-pay | Admitting: Cardiovascular Disease

## 2018-01-24 NOTE — Telephone Encounter (Signed)
New message   1. Has your device fired? Unsure per daughter, states her mother felt something in her chest while she was asleep.   2. Is you device beeping? no  3. Are you experiencing draining or swelling at device site? no  4. Are you calling to see if we received your device transmission? no  5. Have you passed out? No     Please route to [place]

## 2018-01-24 NOTE — Telephone Encounter (Signed)
Unable to reach [place], VM full, so I called Ms. Antonson at home.  She reports that overnight she was asleep and woke up to an awful noise coming from her chest. She feels fine at this time. I advised her that her pacemaker doesn't beep. She is having difficulty hearing and does not understand about the home monitor (she has not transmitted since Dec 2017, at that time the battery had 4y 23m remaining longevity, 31% RV pacing). I let her know that I would attempt Neoma Laming again, she is appreciative.  Neoma Laming reports that this morning her mother called her and overnight she was "jarred" in the chest and woken from a deep sleep. She thinks it is her pacemaker. She is at work 2 hours from her mother and her sister is also at work; they will not be able to get to their mother until Friday. Neoma Laming says that her mother has been "sluggish" all week. She doesn't know where the monitor is or how to complete a transmission. I offered a device clinic appt for Friday to check the device, but advised them that I don't have a provider in the office (no EP) that day. Scheduled for 12:00 on 01/26/18.

## 2018-01-25 ENCOUNTER — Telehealth: Payer: Self-pay

## 2018-01-26 ENCOUNTER — Ambulatory Visit (INDEPENDENT_AMBULATORY_CARE_PROVIDER_SITE_OTHER): Payer: PPO | Admitting: *Deleted

## 2018-01-26 DIAGNOSIS — Z950 Presence of cardiac pacemaker: Secondary | ICD-10-CM | POA: Diagnosis not present

## 2018-01-26 DIAGNOSIS — I482 Chronic atrial fibrillation, unspecified: Secondary | ICD-10-CM

## 2018-01-26 LAB — CUP PACEART INCLINIC DEVICE CHECK
Battery Impedance: 2431 Ohm
Battery Remaining Longevity: 32 mo
Battery Voltage: 2.76 V
Brady Statistic RV Percent Paced: 42 %
Date Time Interrogation Session: 20190802142402
Implantable Lead Implant Date: 19980924
Implantable Lead Location: 753860
Implantable Lead Model: 5068
Implantable Pulse Generator Implant Date: 20130429
Lead Channel Impedance Value: 0 Ohm
Lead Channel Impedance Value: 1235 Ohm
Lead Channel Pacing Threshold Amplitude: 1 V
Lead Channel Pacing Threshold Amplitude: 1.125 V
Lead Channel Pacing Threshold Pulse Width: 0.4 ms
Lead Channel Pacing Threshold Pulse Width: 0.4 ms
Lead Channel Sensing Intrinsic Amplitude: 22.4 mV
Lead Channel Setting Pacing Amplitude: 2.25 V
Lead Channel Setting Pacing Pulse Width: 0.4 ms
Lead Channel Setting Sensing Sensitivity: 5.6 mV

## 2018-01-26 NOTE — Progress Notes (Signed)
Pacemaker check in clinic. Ms. Courtney Evans c/o suddenly waking up from her PPM "knocking" her chest a few nights ago. She has felt "sluggish" recently. Normal device function. Threshold, sensing, impedance consistent with previous measurements. Device programmed to maximize longevity. No high ventricular rates noted. Device programmed at appropriate safety margins. Histogram distribution appropriate for patient activity level. Device programmed to optimize intrinsic conduction. Estimated longevity 2.5 years with a range of 1-4 years remaining. Patient enrolled in remote follow-up- [phone] monitor mailed to daughter Courtney Evans's address. Follow-up with PCP next week per daughter, Courtney Evans. ROV with [place] 05/10/18, [facility] 06/08/18.

## 2018-02-07 NOTE — Telephone Encounter (Signed)
Attempted to confirm remote transmission with pt. No answer and was unable to leave a message.   

## 2018-02-09 DIAGNOSIS — Z7901 Long term (current) use of anticoagulants: Secondary | ICD-10-CM | POA: Diagnosis not present

## 2018-02-22 DIAGNOSIS — I10 Essential (primary) hypertension: Secondary | ICD-10-CM | POA: Diagnosis not present

## 2018-02-22 DIAGNOSIS — Z8781 Personal history of (healed) traumatic fracture: Secondary | ICD-10-CM | POA: Diagnosis not present

## 2018-02-22 DIAGNOSIS — S22000A Wedge compression fracture of unspecified thoracic vertebra, initial encounter for closed fracture: Secondary | ICD-10-CM | POA: Diagnosis not present

## 2018-02-22 DIAGNOSIS — Z6827 Body mass index (BMI) 27.0-27.9, adult: Secondary | ICD-10-CM | POA: Diagnosis not present

## 2018-03-09 DIAGNOSIS — Z7901 Long term (current) use of anticoagulants: Secondary | ICD-10-CM | POA: Diagnosis not present

## 2018-03-26 ENCOUNTER — Emergency Department (HOSPITAL_COMMUNITY): Payer: PPO

## 2018-03-26 ENCOUNTER — Other Ambulatory Visit: Payer: Self-pay

## 2018-03-26 ENCOUNTER — Emergency Department (HOSPITAL_COMMUNITY)
Admission: EM | Admit: 2018-03-26 | Discharge: 2018-03-26 | Disposition: A | Discharge status: Home / Self Care | Payer: PPO | Attending: Emergency Medicine | Admitting: Emergency Medicine

## 2018-03-26 ENCOUNTER — Encounter (HOSPITAL_COMMUNITY): Payer: Self-pay | Admitting: Emergency Medicine

## 2018-03-26 DIAGNOSIS — Z950 Presence of cardiac pacemaker: Secondary | ICD-10-CM | POA: Insufficient documentation

## 2018-03-26 DIAGNOSIS — S3993XA Unspecified injury of pelvis, initial encounter: Secondary | ICD-10-CM | POA: Diagnosis not present

## 2018-03-26 DIAGNOSIS — I2510 Atherosclerotic heart disease of native coronary artery without angina pectoris: Secondary | ICD-10-CM | POA: Insufficient documentation

## 2018-03-26 DIAGNOSIS — E119 Type 2 diabetes mellitus without complications: Secondary | ICD-10-CM | POA: Diagnosis not present

## 2018-03-26 DIAGNOSIS — M545 Low back pain, unspecified: Secondary | ICD-10-CM

## 2018-03-26 DIAGNOSIS — I10 Essential (primary) hypertension: Secondary | ICD-10-CM | POA: Insufficient documentation

## 2018-03-26 DIAGNOSIS — W19XXXA Unspecified fall, initial encounter: Secondary | ICD-10-CM | POA: Diagnosis not present

## 2018-03-26 DIAGNOSIS — Z79899 Other long term (current) drug therapy: Secondary | ICD-10-CM | POA: Insufficient documentation

## 2018-03-26 DIAGNOSIS — R279 Unspecified lack of coordination: Secondary | ICD-10-CM | POA: Diagnosis not present

## 2018-03-26 DIAGNOSIS — S3992XA Unspecified injury of lower back, initial encounter: Secondary | ICD-10-CM | POA: Diagnosis not present

## 2018-03-26 DIAGNOSIS — W1811XA Fall from or off toilet without subsequent striking against object, initial encounter: Secondary | ICD-10-CM | POA: Insufficient documentation

## 2018-03-26 DIAGNOSIS — R52 Pain, unspecified: Secondary | ICD-10-CM | POA: Diagnosis not present

## 2018-03-26 DIAGNOSIS — S0990XA Unspecified injury of head, initial encounter: Secondary | ICD-10-CM | POA: Diagnosis not present

## 2018-03-26 DIAGNOSIS — Z7901 Long term (current) use of anticoagulants: Secondary | ICD-10-CM | POA: Diagnosis not present

## 2018-03-26 DIAGNOSIS — R0902 Hypoxemia: Secondary | ICD-10-CM | POA: Diagnosis not present

## 2018-03-26 DIAGNOSIS — I959 Hypotension, unspecified: Secondary | ICD-10-CM | POA: Diagnosis not present

## 2018-03-26 DIAGNOSIS — Z743 Need for continuous supervision: Secondary | ICD-10-CM | POA: Diagnosis not present

## 2018-03-26 DIAGNOSIS — G8929 Other chronic pain: Secondary | ICD-10-CM | POA: Diagnosis not present

## 2018-03-26 MED ORDER — ACETAMINOPHEN 500 MG PO TABS
1000.0000 mg | ORAL_TABLET | Freq: Once | ORAL | Status: AC
  Administered 2018-03-26: 1000 mg via ORAL
  Filled 2018-03-26: qty 2

## 2018-03-26 MED ORDER — OXYCODONE HCL 5 MG PO TABS
2.5000 mg | ORAL_TABLET | Freq: Once | ORAL | Status: AC
  Administered 2018-03-26: 2.5 mg via ORAL
  Filled 2018-03-26: qty 1

## 2018-03-26 NOTE — ED Notes (Signed)
PTAR at beside.

## 2018-03-26 NOTE — ED Notes (Signed)
MD at bedside. 

## 2018-03-26 NOTE — ED Provider Notes (Signed)
[facility] EMERGENCY DEPARTMENT Provider Note   CSN: [id number] Arrival date & time: 03/26/18  1448     History   Chief Complaint Chief Complaint  Patient presents with  . Fall    HPI Courtney Evans is a 82 y.o. female.  82 yo F with a cc of a fall.  This sounds mechanical in nature.  The patient was trying to sit on the toilet in the middle the night and she missed and slid down the bathtub which is next to the toilet.  She thinks that she injured her back but was able to get up and walk back to the bed.  She has a history of chronic back pain.  States it hurts down low in the midline.  She does think that she struck her head.  She denies any chest pain abdominal pain lower extremity pain upper extremity pain.  Denies loss of bowel or bladder denies loss perirectal sensation.  The history is provided by the patient.  Fall  This is a new problem. The current episode started 1 to 2 hours ago. The problem occurs constantly. The problem has not changed since onset.Pertinent negatives include no chest pain, no headaches and no shortness of breath. The symptoms are aggravated by bending. Nothing relieves the symptoms. She has tried nothing for the symptoms. The treatment provided no relief.    Past Medical History:  Diagnosis Date  . A-fib ([place])   . Compression fracture   . Coronary artery disease   . Diabetes mellitus without complication ([place])   . Hyperlipidemia   . Hypertension   . Pacemaker     Patient Active Problem List   Diagnosis Date Noted  . Subtherapeutic international normalized ratio (INR)   . Back pain 10/19/2016  . Hypoxia 10/19/2016  . Type 2 diabetes mellitus without complication ([place]) 18/56/3149  . Acute on chronic diastolic CHF (congestive heart failure) ([place]) 10/19/2016  . Breast hematoma 10/19/2016  . Anemia 08/26/2016  . Rheumatic heart disease 02/19/2016  . LBBB (left bundle branch block) 02/19/2016  . Pneumonia 11/19/2015  . Sepsis  due to pneumonia ([place]) 11/19/2015  . CAP (community acquired pneumonia) 11/19/2015  . Coagulopathy ([place]) 11/19/2015  . Chronic atrial fibrillation ([place]) 11/19/2015  . Lactic acidosis 11/19/2015  . Mechanical heart valve present 11/19/2015    Past Surgical History:  Procedure Laterality Date  . AORTIC AND MITRAL VALVE REPLACEMENT  1994   w/ mechanical valves at [facility]  . BACK SURGERY    . CARDIAC SURGERY    . KYPHOPLASTY  2011   1 - at [facility]  . KYPHOPLASTY  2012   2 - at [facility]  . KYPHOPLASTY  2013   1 - at [facility]  . KYPHOPLASTY  05/2015   1 - at [facility]  . PACEMAKER GENERATOR CHANGE  2013  . [place]     [place] History   None      Home Medications    Prior to Admission medications   Medication Sig Start Date End Date Taking? Authorizing Provider  acetaminophen (TYLENOL) 500 MG tablet Take 500-1,000 mg by mouth every 6 (six) hours as needed for mild pain, moderate pain, fever or headache.    [provider]  amLODipine (NORVASC) 5 MG tablet Take 5 mg by mouth daily.  [provider]  atorvastatin (LIPITOR) 20 MG tablet Take 20 mg by mouth daily.     [provider]  cholecalciferol (VITAMIN D) 1000 units tablet Take 2,000 Units by mouth daily.     [provider]  docusate sodium (COLACE) 100 MG capsule Take 1 capsule (100 mg total) every 12 (twelve) hours by mouth. Patient taking differently: Take 100 mg by mouth daily as needed.  05/08/17   Jola Schmidt, MD  HYDROcodone-acetaminophen (NORCO/VICODIN) 5-325 MG tablet Take 1 tablet every 4 (four) hours as needed by mouth for moderate pain. 05/08/17   Jola Schmidt, MD  ibuprofen (ADVIL,MOTRIN) 400 MG tablet Take 1 tablet (400 mg total) every 8 (eight) hours as needed by mouth. 05/08/17   Jola Schmidt, MD  metFORMIN  (GLUCOPHAGE) 500 MG tablet Take 500 mg by mouth 2 (two) times daily with a meal.     [provider]  nitroGLYCERIN (NITROSTAT) 0.4 MG SL tablet Place 0.4 mg under the tongue every 5 (five) minutes as needed for chest pain.    [provider]  polyethylene glycol (MIRALAX / GLYCOLAX) packet Take 17 g by mouth daily as needed for mild constipation.    [provider]  warfarin (COUMADIN) 3 MG tablet Take 1.5-3 mg by mouth every evening. Pt takes one-half tablet on Monday and one tablet all other days.    [provider]    Family History Family History  Problem Relation Age of Onset  . Heart attack Mother   . Stroke Father     Social History Social History   Tobacco Use  . Smoking status: Never Smoker  . Smokeless tobacco: Never Used  Substance Use Topics  . Alcohol use: No  . Drug use: No     Allergies   Codeine   Review of Systems Review of Systems  Constitutional: Negative for chills and fever.  HENT: Negative for congestion and rhinorrhea.   Eyes: Negative for redness and visual disturbance.  Respiratory: Negative for shortness of breath and wheezing.   Cardiovascular: Negative for chest pain and palpitations.  Gastrointestinal: Negative for nausea and vomiting.  Genitourinary: Negative for dysuria and urgency.  Musculoskeletal: Positive for back pain. Negative for arthralgias and myalgias.  Skin: Negative for pallor and wound.  Neurological: Negative for dizziness and headaches.     Physical Exam Updated Vital Signs Pulse 85   Temp 98.3 F (36.8 C) (Oral)   Ht 5\' 4"  (1.626 m)   Wt 61.2 kg   SpO2 94%   BMI 23.17 kg/m   Physical Exam  Constitutional: She is oriented to person, place, and time. She appears well-developed and well-nourished. No distress.  HENT:  Head: Normocephalic and atraumatic.  Eyes: Pupils are equal, round, and reactive to light. EOM are normal.  Neck: Normal range of motion. Neck supple.    Cardiovascular: Normal rate and regular rhythm. Exam reveals no gallop and no friction rub.  No murmur heard. Pulmonary/Chest: Effort normal. She has no wheezes. She has no rales.  Abdominal: Soft. She exhibits no distension and no mass. There is no tenderness. There is no rebound and no guarding.  Musculoskeletal: She exhibits tenderness ( Midline low back.). She exhibits no edema.  Small bruise to the left tricep.  Small bruise to the base of the right jaw.  Both appear old.  Neurological: She is alert and oriented to person, place, and time.  Skin: Skin is warm and dry. She is not diaphoretic.  Psychiatric: She has  a normal mood and affect. Her behavior is normal.  Nursing note and vitals reviewed.    ED Treatments / Results  Labs (all labs ordered are listed, but only abnormal results are displayed) Labs Reviewed - No data to display  EKG None  Radiology No results found.  Procedures Procedures (including critical care time)  Medications Ordered in ED Medications  acetaminophen (TYLENOL) tablet 1,000 mg (1,000 mg Oral Given 03/26/18 0740)  oxyCODONE (Oxy IR/ROXICODONE) immediate release tablet 2.5 mg (2.5 mg Oral Given 03/26/18 0741)     Initial Impression / Assessment and Plan / ED Course  I have reviewed the triage vital signs and the nursing notes.  Pertinent labs & imaging results that were available during my care of the patient were reviewed by me and considered in my medical decision making (see chart for details).     82 yo F with a cc of a mechanical fall.  Patient tried to sit on the toilet and missed.  Complaining of low back pain.  Doubt fx, will obtain plain films.  CT head with bumping head.   CT negative, low back with old fx but no new.  D/c home.   9:16 AM:  I have discussed the diagnosis/risks/treatment options with the patient and family and believe the pt to be eligible for discharge home to follow-up with PCP. We also discussed returning to the ED  immediately if new or worsening sx occur. We discussed the sx which are most concerning (e.g., sudden worsening pain, fever, inability to tolerate by mouth) that necessitate immediate return. Medications administered to the patient during their visit and any new prescriptions provided to the patient are listed below.  Medications given during this visit Medications  acetaminophen (TYLENOL) tablet 1,000 mg (1,000 mg Oral Given 03/26/18 0740)  oxyCODONE (Oxy IR/ROXICODONE) immediate release tablet 2.5 mg (2.5 mg Oral Given 03/26/18 0741)      The patient appears reasonably screen and/or stabilized for discharge and I doubt any other medical condition or other [facility] requiring further screening, evaluation, or treatment in the ED at this time prior to discharge.    Final Clinical Impressions(s) / ED Diagnoses   Final diagnoses:  None    ED Discharge Orders    None       Deno Etienne, DO 03/26/18 [id number]

## 2018-03-26 NOTE — ED Triage Notes (Addendum)
Patient presents to the ED from Home, states she was sitting on the toilet and slide off onto the floor. Patient states she hit her back on her tub. Patient denies any LOC states she is unaware if she on any blood thinners. Patient alert and oriented complaints of lower back pain. Patient states she has a history of chronic back pain just worst with fall.

## 2018-04-06 DIAGNOSIS — Z23 Encounter for immunization: Secondary | ICD-10-CM | POA: Diagnosis not present

## 2018-04-06 DIAGNOSIS — G8929 Other chronic pain: Secondary | ICD-10-CM | POA: Diagnosis not present

## 2018-04-06 DIAGNOSIS — R296 Repeated falls: Secondary | ICD-10-CM | POA: Diagnosis not present

## 2018-04-06 DIAGNOSIS — M545 Low back pain: Secondary | ICD-10-CM | POA: Diagnosis not present

## 2018-04-06 DIAGNOSIS — Z7901 Long term (current) use of anticoagulants: Secondary | ICD-10-CM | POA: Diagnosis not present

## 2018-04-06 DIAGNOSIS — R2689 Other abnormalities of gait and mobility: Secondary | ICD-10-CM | POA: Diagnosis not present

## 2018-04-09 DIAGNOSIS — Z7901 Long term (current) use of anticoagulants: Secondary | ICD-10-CM | POA: Diagnosis not present

## 2018-04-09 DIAGNOSIS — Z952 Presence of prosthetic heart valve: Secondary | ICD-10-CM | POA: Diagnosis not present

## 2018-04-09 DIAGNOSIS — I480 Paroxysmal atrial fibrillation: Secondary | ICD-10-CM | POA: Diagnosis not present

## 2018-04-20 DIAGNOSIS — M545 Low back pain: Secondary | ICD-10-CM | POA: Diagnosis not present

## 2018-04-20 DIAGNOSIS — Z7901 Long term (current) use of anticoagulants: Secondary | ICD-10-CM | POA: Diagnosis not present

## 2018-04-24 DIAGNOSIS — Z87311 Personal history of (healed) other pathological fracture: Secondary | ICD-10-CM | POA: Diagnosis not present

## 2018-04-24 DIAGNOSIS — I4891 Unspecified atrial fibrillation: Secondary | ICD-10-CM | POA: Diagnosis not present

## 2018-04-24 DIAGNOSIS — M62830 Muscle spasm of back: Secondary | ICD-10-CM | POA: Diagnosis not present

## 2018-04-24 DIAGNOSIS — Z79891 Long term (current) use of opiate analgesic: Secondary | ICD-10-CM | POA: Diagnosis not present

## 2018-04-24 DIAGNOSIS — E119 Type 2 diabetes mellitus without complications: Secondary | ICD-10-CM | POA: Diagnosis not present

## 2018-04-24 DIAGNOSIS — R2689 Other abnormalities of gait and mobility: Secondary | ICD-10-CM | POA: Diagnosis not present

## 2018-04-24 DIAGNOSIS — G8929 Other chronic pain: Secondary | ICD-10-CM | POA: Diagnosis not present

## 2018-04-24 DIAGNOSIS — Z7901 Long term (current) use of anticoagulants: Secondary | ICD-10-CM | POA: Diagnosis not present

## 2018-04-24 DIAGNOSIS — M810 Age-related osteoporosis without current pathological fracture: Secondary | ICD-10-CM | POA: Diagnosis not present

## 2018-04-24 DIAGNOSIS — M40209 Unspecified kyphosis, site unspecified: Secondary | ICD-10-CM | POA: Diagnosis not present

## 2018-04-24 DIAGNOSIS — R296 Repeated falls: Secondary | ICD-10-CM | POA: Diagnosis not present

## 2018-05-10 ENCOUNTER — Ambulatory Visit (INDEPENDENT_AMBULATORY_CARE_PROVIDER_SITE_OTHER): Payer: PPO | Admitting: Cardiovascular Disease

## 2018-05-10 ENCOUNTER — Encounter: Payer: Self-pay | Admitting: Cardiovascular Disease

## 2018-05-10 VITALS — BP 166/72 | HR 72 | Ht 64.0 in | Wt 135.0 lb

## 2018-05-10 DIAGNOSIS — Z950 Presence of cardiac pacemaker: Secondary | ICD-10-CM | POA: Diagnosis not present

## 2018-05-10 DIAGNOSIS — L570 Actinic keratosis: Secondary | ICD-10-CM | POA: Diagnosis not present

## 2018-05-10 DIAGNOSIS — C44629 Squamous cell carcinoma of skin of left upper limb, including shoulder: Secondary | ICD-10-CM | POA: Diagnosis not present

## 2018-05-10 DIAGNOSIS — D692 Other nonthrombocytopenic purpura: Secondary | ICD-10-CM | POA: Diagnosis not present

## 2018-05-10 DIAGNOSIS — I10 Essential (primary) hypertension: Secondary | ICD-10-CM | POA: Diagnosis not present

## 2018-05-10 DIAGNOSIS — D485 Neoplasm of uncertain behavior of skin: Secondary | ICD-10-CM | POA: Diagnosis not present

## 2018-05-10 DIAGNOSIS — E7800 Pure hypercholesterolemia, unspecified: Secondary | ICD-10-CM | POA: Diagnosis not present

## 2018-05-10 DIAGNOSIS — I4821 Permanent atrial fibrillation: Secondary | ICD-10-CM | POA: Insufficient documentation

## 2018-05-10 DIAGNOSIS — N814 Uterovaginal prolapse, unspecified: Secondary | ICD-10-CM | POA: Diagnosis not present

## 2018-05-10 DIAGNOSIS — I5032 Chronic diastolic (congestive) heart failure: Secondary | ICD-10-CM | POA: Diagnosis not present

## 2018-05-10 DIAGNOSIS — Z952 Presence of prosthetic heart valve: Secondary | ICD-10-CM | POA: Diagnosis not present

## 2018-05-10 DIAGNOSIS — Z85828 Personal history of other malignant neoplasm of skin: Secondary | ICD-10-CM | POA: Diagnosis not present

## 2018-05-10 DIAGNOSIS — N8110 Cystocele, unspecified: Secondary | ICD-10-CM | POA: Diagnosis not present

## 2018-05-10 DIAGNOSIS — Z7901 Long term (current) use of anticoagulants: Secondary | ICD-10-CM | POA: Diagnosis not present

## 2018-05-10 DIAGNOSIS — Z6822 Body mass index (BMI) 22.0-22.9, adult: Secondary | ICD-10-CM | POA: Diagnosis not present

## 2018-05-10 DIAGNOSIS — L821 Other seborrheic keratosis: Secondary | ICD-10-CM | POA: Diagnosis not present

## 2018-05-10 DIAGNOSIS — I447 Left bundle-branch block, unspecified: Secondary | ICD-10-CM | POA: Diagnosis not present

## 2018-05-10 DIAGNOSIS — I2721 Secondary pulmonary arterial hypertension: Secondary | ICD-10-CM | POA: Diagnosis not present

## 2018-05-10 DIAGNOSIS — Z01419 Encounter for gynecological examination (general) (routine) without abnormal findings: Secondary | ICD-10-CM | POA: Diagnosis not present

## 2018-05-10 DIAGNOSIS — Z960 Presence of urogenital implants: Secondary | ICD-10-CM | POA: Diagnosis not present

## 2018-05-10 NOTE — Patient Instructions (Signed)
Medication Instructions:  Dr Sallyanne Kuster recommends that you continue on your current medications as directed. Please refer to the Current Medication list given to you today.  If you need a refill on your cardiac medications before your next appointment, please call your pharmacy.   Testing/Procedures: 1. Echocardiogram - Your physician has requested that you have an echocardiogram. Echocardiography is a painless test that uses sound waves to create images of your heart. It provides your doctor with information about the size and shape of your heart and how well your heart's chambers and valves are working. This procedure takes approximately one hour. There are no restrictions for this procedure.  >> This will be performed at our [facility] location [place], [place] [address] [phone]  2. Remote Pacemaker Transmission - Remote monitoring is used to monitor your Pacemaker of ICD from home. This monitoring reduces the number of office visits required to check your device to one time per year. It allows [place] to keep an eye on the functioning of your device to ensure it is working properly. You are scheduled for a device check from home on Friday, December 13th, 2019. You may send your transmission at any time that day. If you have a wireless device, the transmission will be sent automatically. After your physician reviews your transmission, you will receive a postcard with your next transmission date.   Follow-Up: At [facility], you and your health needs are our priority.  As part of our continuing mission to provide you with exceptional heart care, we have created designated Provider Care Teams.  These Care Teams include your primary Cardiologist (physician) and Advanced Practice Providers (APPs -  Physician Assistants and Nurse Practitioners) who all work together to provide you with the care you need, when you need it. You will need a follow up appointment in 12 months.   Please call our office 2 months in advance to schedule this appointment.  You may see Sanda Klein, MD or one of the following Advanced Practice Providers on your designated Care Team: [place], [place] . Fabian Sharp, PA-C

## 2018-05-10 NOTE — Progress Notes (Signed)
Cardiology Office Note    Date:  05/10/2018   ID:  Courtney Evans, DOB 06-13-1936, MRN [id number]  PCP:  Cari Caraway, MD  Cardiologist:   Sanda Klein, MD   Chief Complaint  Patient presents with  . Pacemaker Check  Atrial fibrillation, valvular heart disease  History of Present Illness:  Courtney Evans is a 82 y.o. female with long-standing persistent atrial fibrillation, single-chamber permanent pacemaker, aortic and mitral mechanical valve replacement here for follow-up. She is accompanied by her daughter Lattie Haw, a Copywriter, advertising.  She has not had any cardiac problems since her last appointment.  Her biggest issue is severe low back pain, with treatment limited by the fact that she requires chronic warfarin anticoagulation for dual aortic and mitral mechanical prosthesis and permanent atrial fibrillation.  The back problems make her quite sedentary.  He says of chest pain or shortness of breath either at rest or with activity.  Does not have palpitations and denies dizziness or syncope.  She has not had any falls or injuries.  Her blood pressure at home is in the 120s/60s, believes it is high today because she is in pain.  Most recent echocardiogram in April 2018 showed left ventricular ejection fraction 50-55%, biatrial dilation, mechanical MVR with mean gradient of 3 mmHg, mechanical AVR mean gradient of 12 mmHg, moderate tricuspid regurgitation with estimated systolic PA pressure 44 mmHg.  Comprehensive pacemaker check today shows normal device function.  The estimated generator longevity is 2.5 years (range 1-3.5 years), she is not pacemaker dependent, 41% ventricular pacing, no episodes of high ventricular rates, normal lead parameters.  She has a long-standing history of heart problems. She had rheumatic heart disease and was initially diagnosed in 1962 when she underwent some type of surgical procedure (presumably a closed commissurotomy during that historical period). In 1994  she underwent aortic and mitral valve replacement at [facility] receiving 2 mechanical valves. In 1998 received a single-chamber permanent pacemaker generator, change out in 2013  [facility]).  She bears a diagnosis of diastolic heart failure, but does not require routine loop diuretic therapy. She had an episode of acute exacerbation of heart failure in December 2016 at the time of a vertebral fracture. Previous echocardiograms have documented moderate pulmonary artery hypertension in the 50-55 mmHg range. She has not had stroke or other embolic events and denies any complications with warfarin anticoagulation. Anticoagulation is currently monitored by her primary care provider, Dr. Cari Caraway. She has had problems with osteoporosis vertebral compression fractions and has had multiple kyphoplasty's. She has hyperlipidemia and takes a statin, but as far as I know has never had coronary problems. She has mild hypertension, treated with amlodipine.    Past Medical History:  Diagnosis Date  . A-fib ([place])   . Compression fracture   . Coronary artery disease   . Diabetes mellitus without complication ([place])   . Hyperlipidemia   . Hypertension   . Pacemaker     Past Surgical History:  Procedure Laterality Date  . AORTIC AND MITRAL VALVE REPLACEMENT  1994   w/ mechanical valves at [facility]  . BACK SURGERY    . CARDIAC SURGERY    . KYPHOPLASTY  2011   1 - at [facility]  . KYPHOPLASTY  2012   2 - at [facility]  . KYPHOPLASTY  2013   1 - at [facility]  . KYPHOPLASTY  05/2015   1 - at [facility]  [address]  . PACEMAKER GENERATOR CHANGE  2013  . [place]    Current Medications: Outpatient Medications Prior to Visit  Medication Sig Dispense Refill  . acetaminophen (TYLENOL) 500 MG tablet Take 500-1,000 mg by mouth every 6 (six) hours as  needed for mild pain, moderate pain, fever or headache.    Marland Kitchen amLODipine (NORVASC) 5 MG tablet Take 5 mg by mouth daily.     Marland Kitchen atorvastatin (LIPITOR) 20 MG tablet Take 20 mg by mouth daily.     . cholecalciferol (VITAMIN D) 1000 units tablet Take 2,000 Units by mouth daily.     Marland Kitchen docusate sodium (COLACE) 100 MG capsule Take 1 capsule (100 mg total) every 12 (twelve) hours by mouth. (Patient taking differently: Take 100 mg by mouth daily as needed. ) 30 capsule 0  . nitroGLYCERIN (NITROSTAT) 0.4 MG SL tablet Place 0.4 mg under the tongue every 5 (five) minutes as needed for chest pain.    . polyethylene glycol (MIRALAX / GLYCOLAX) packet Take 17 g by mouth daily as needed for mild constipation.    Marland Kitchen warfarin (COUMADIN) 3 MG tablet Take 1.5-3 mg by mouth every evening. Pt takes one-half tablet on Monday and one tablet all other days.    Marland Kitchen HYDROcodone-acetaminophen (NORCO/VICODIN) 5-325 MG tablet Take 1 tablet every 4 (four) hours as needed by mouth for moderate pain. 15 tablet 0  . ibuprofen (ADVIL,MOTRIN) 400 MG tablet Take 1 tablet (400 mg total) every 8 (eight) hours as needed by mouth. 12 tablet 0  . metFORMIN (GLUCOPHAGE) 500 MG tablet Take 500 mg by mouth 2 (two) times daily with a meal.      No facility-administered medications prior to visit.      Allergies:   Codeine   Social History   Socioeconomic History  . Marital status: Married    Spouse name: Not on file  . Number of children: Not on file  . Years of education: Not on file  . Highest education level: Not on file  Occupational History  . Not on file  Social Needs  . Financial resource strain: Not on file  . Food insecurity:    Worry: Not on file    Inability: Not on file  . Transportation needs:    Medical: Not on file    Non-medical: Not on file  Tobacco Use  . Smoking status: Never Smoker  . Smokeless tobacco: Never Used  Substance and Sexual Activity  . Alcohol use: No  . Drug use: No  . Sexual activity: Never    Lifestyle  . Physical activity:    Days per week: Not on file    Minutes per session: Not on file  . Stress: Not on file  Relationships  . Social connections:    Talks on phone: Not on file    Gets together: Not on file    Attends religious service: Not on file    Active member of club or organization: Not on file    Attends meetings of clubs or organizations: Not on file    Relationship status: Not on file  Other Topics Concern  . Not on file  Social History Narrative   Epworth Sleepiness Scale = 3 (as of 02/19/2016)     Family History:  The patient's family history includes Heart attack in her mother; Stroke in her father.   ROS:   Please see the history of  present illness.    ROS all other systems are reviewed and are negative   PHYSICAL EXAM:   VS:  BP (!) 166/72   Pulse 72   Ht 5\' 4"  (1.626 m)   Wt 135 lb (61.2 kg)   BMI 23.17 kg/m     General: Alert, oriented x3, no distress, lean, well-healed left subclavian pacemaker site Head: no evidence of trauma, PERRL, EOMI, no exophtalmos or lid lag, no myxedema, no xanthelasma; normal ears, nose and oropharynx Neck: normal jugular venous pulsations and no hepatojugular reflux; brisk carotid pulses without delay and no carotid bruits Chest: clear to auscultation, no signs of consolidation by percussion or palpation, normal fremitus, symmetrical and full respiratory excursions Cardiovascular: normal position and quality of the apical impulse, irregular rhythm, crisp mechanical prostatic valve clicks, no murmurs, rubs or gallops Abdomen: no tenderness or distention, no masses by palpation, no abnormal pulsatility or arterial bruits, normal bowel sounds, no hepatosplenomegaly Extremities: no clubbing, cyanosis or edema; 2+ radial, ulnar and brachial pulses bilaterally; 2+ right femoral, posterior tibial and dorsalis pedis pulses; 2+ left femoral, posterior tibial and dorsalis pedis pulses; no subclavian or femoral  bruits Neurological: grossly nonfocal Psych: Normal mood and affect   Wt Readings from Last 3 Encounters:  05/10/18 135 lb (61.2 kg)  03/26/18 135 lb (61.2 kg)  10/24/16 125 lb 7.1 oz (56.9 kg)      Studies/Labs Reviewed:   EKG:  EKG is ordered today.  The ekg ordered today demonstrates mostly atrial sensed rhythm (atrial fibrillation with controlled ventricular rate), occasional paced ventricular beats and fusion beats the native QRS is very broad with a pattern of left bundle branch block, QRS 140 ms, QTC 473 ms  Recent Labs: June 29, 2017 Hemoglobin 14.0, creatinine 0.7, calcium 3.9, glucose 116,  Lipid Panel 2015 HDL 53, calculated LDL 129, triglycerides 133  ASSESSMENT:    No diagnosis found.   PLAN:  In order of problems listed above:  1. AFib: Well rate controlled without occasions.  Occasional ventricular pacing. 2. CHF: Appears clinically euvolemic.  Not requiring diuretics.  Activity is limited by back problems rather than dyspnea. 3. AVR: Normal prosthetic valve function by echo 2018, will recheck in 2020 unless symptoms develop.  Aware of need for endocarditis prophylaxis with dental/surgical procedures. 4. MVR: Normal prosthetic valve function by echo 2018, will recheck in 2020. 5. PAH: Suspect that she may have fixed pulmonary artery hypertension from long-standing rheumatic mitral valve disease (? Mitral stenosis).  Check on echo in 2018 was very similar to the findings of [place] in 2016 6. PPM: Normally functioning single-chamber device, she is not pacemaker dependent.  Continue remote downloads every 3 months, yearly office visits. 7. Warfarin: Monitored by Dr. Addison Lank 8. HLP: On statin, labs monitored by Dr. Addison Lank 9. LBBB: Relatively preserved EF, no clinical heart failure, no need for upgrade to CRT device. 10. HTN: Well-controlled typically, but currently in pain with elevated blood pressure.    Medication Adjustments/Labs and Tests  Ordered: Current medicines are reviewed at length with the patient today.  Concerns regarding medicines are outlined above.  Medication changes, Labs and Tests ordered today are listed in the Patient Instructions below. There are no Patient Instructions on file for this visit.   Signed, Sanda Klein, MD  05/10/2018 10:03 AM    [facility] [place], [place], [place]  [address] Phone: [phone]; Fax: [phone]

## 2018-05-11 LAB — CUP PACEART INCLINIC DEVICE CHECK
Date Time Interrogation Session: 20191115153606
Lead Channel Setting Pacing Amplitude: 2.25 V
Lead Channel Setting Pacing Pulse Width: 0.4 ms
MDC IDC LEAD IMPLANT DT: 19980924
MDC IDC LEAD LOCATION: 753860
MDC IDC PG IMPLANT DT: 20130429
MDC IDC SET LEADCHNL RV SENSING SENSITIVITY: 5.6 mV

## 2018-05-16 ENCOUNTER — Other Ambulatory Visit: Payer: Self-pay

## 2018-05-16 ENCOUNTER — Emergency Department (HOSPITAL_COMMUNITY): Payer: PPO

## 2018-05-16 ENCOUNTER — Encounter (HOSPITAL_COMMUNITY): Payer: Self-pay | Admitting: Emergency Medicine

## 2018-05-16 ENCOUNTER — Emergency Department (HOSPITAL_COMMUNITY)
Admission: EM | Admit: 2018-05-16 | Discharge: 2018-05-16 | Disposition: A | Discharge status: Home / Self Care | Payer: PPO | Attending: Emergency Medicine | Admitting: Emergency Medicine

## 2018-05-16 DIAGNOSIS — I5032 Chronic diastolic (congestive) heart failure: Secondary | ICD-10-CM | POA: Insufficient documentation

## 2018-05-16 DIAGNOSIS — I447 Left bundle-branch block, unspecified: Secondary | ICD-10-CM | POA: Diagnosis not present

## 2018-05-16 DIAGNOSIS — E119 Type 2 diabetes mellitus without complications: Secondary | ICD-10-CM | POA: Insufficient documentation

## 2018-05-16 DIAGNOSIS — K529 Noninfective gastroenteritis and colitis, unspecified: Secondary | ICD-10-CM | POA: Diagnosis not present

## 2018-05-16 DIAGNOSIS — I4891 Unspecified atrial fibrillation: Secondary | ICD-10-CM | POA: Diagnosis not present

## 2018-05-16 DIAGNOSIS — Z7901 Long term (current) use of anticoagulants: Secondary | ICD-10-CM | POA: Insufficient documentation

## 2018-05-16 DIAGNOSIS — I213 ST elevation (STEMI) myocardial infarction of unspecified site: Secondary | ICD-10-CM | POA: Diagnosis not present

## 2018-05-16 DIAGNOSIS — Z950 Presence of cardiac pacemaker: Secondary | ICD-10-CM | POA: Insufficient documentation

## 2018-05-16 DIAGNOSIS — R0902 Hypoxemia: Secondary | ICD-10-CM | POA: Diagnosis not present

## 2018-05-16 DIAGNOSIS — Z79899 Other long term (current) drug therapy: Secondary | ICD-10-CM | POA: Diagnosis not present

## 2018-05-16 DIAGNOSIS — R1032 Left lower quadrant pain: Secondary | ICD-10-CM | POA: Diagnosis not present

## 2018-05-16 DIAGNOSIS — I110 Hypertensive heart disease with heart failure: Secondary | ICD-10-CM | POA: Insufficient documentation

## 2018-05-16 DIAGNOSIS — R42 Dizziness and giddiness: Secondary | ICD-10-CM | POA: Diagnosis not present

## 2018-05-16 LAB — COMPREHENSIVE METABOLIC PANEL
ALK PHOS: 90 U/L (ref 38–126)
ALT: 16 U/L (ref 0–44)
ANION GAP: 9 (ref 5–15)
AST: 33 U/L (ref 15–41)
Albumin: 4.1 g/dL (ref 3.5–5.0)
BUN: 10 mg/dL (ref 8–23)
CALCIUM: 9.5 mg/dL (ref 8.9–10.3)
CO2: 26 mmol/L (ref 22–32)
CREATININE: 0.95 mg/dL (ref 0.44–1.00)
Chloride: 101 mmol/L (ref 98–111)
GFR calc non Af Amer: 55 mL/min — ABNORMAL LOW (ref >60)
Glucose, Bld: 146 mg/dL — ABNORMAL HIGH (ref 70–99)
Potassium: 3.6 mmol/L (ref 3.5–5.1)
SODIUM: 136 mmol/L (ref 135–145)
TOTAL PROTEIN: 6.9 g/dL (ref 6.5–8.1)
Total Bilirubin: 1.5 mg/dL — ABNORMAL HIGH (ref 0.3–1.2)

## 2018-05-16 LAB — CBC WITH DIFFERENTIAL/PLATELET
ABS IMMATURE GRANULOCYTES: 0.05 10*3/uL (ref 0.00–0.07)
Basophils Absolute: 0 10*3/uL (ref 0.0–0.1)
Basophils Relative: 0 %
EOS PCT: 0 %
Eosinophils Absolute: 0 10*3/uL (ref 0.0–0.5)
HEMATOCRIT: 45.4 % (ref 36.0–46.0)
HEMOGLOBIN: 14.4 g/dL (ref 12.0–15.0)
Immature Granulocytes: 0 %
LYMPHS PCT: 7 %
Lymphs Abs: 0.8 10*3/uL (ref 0.7–4.0)
MCH: 30.6 pg (ref 26.0–34.0)
MCHC: 31.7 g/dL (ref 30.0–36.0)
MCV: 96.4 fL (ref 80.0–100.0)
MONO ABS: 1 10*3/uL (ref 0.1–1.0)
Monocytes Relative: 9 %
Neutro Abs: 9.8 10*3/uL — ABNORMAL HIGH (ref 1.7–7.7)
Neutrophils Relative %: 84 %
Platelets: 211 10*3/uL (ref 150–400)
RBC: 4.71 MIL/uL (ref 3.87–5.11)
RDW: 13.2 % (ref 11.5–15.5)
WBC: 11.7 10*3/uL — AB (ref 4.0–10.5)
nRBC: 0 % (ref 0.0–0.2)

## 2018-05-16 LAB — LIPASE, BLOOD: Lipase: 28 U/L (ref 11–51)

## 2018-05-16 LAB — PROTIME-INR
INR: 3.21
PROTHROMBIN TIME: 32.4 s — AB (ref 11.4–15.2)

## 2018-05-16 LAB — I-STAT CG4 LACTIC ACID, ED: LACTIC ACID, VENOUS: 1.04 mmol/L (ref 0.5–1.9)

## 2018-05-16 MED ORDER — FENTANYL CITRATE (PF) 100 MCG/2ML IJ SOLN
50.0000 ug | Freq: Once | INTRAMUSCULAR | Status: AC
Start: 2018-05-16 — End: 2018-05-16
  Administered 2018-05-16: 50 ug via INTRAVENOUS
  Filled 2018-05-16: qty 2

## 2018-05-16 MED ORDER — HYDROCODONE-ACETAMINOPHEN 5-325 MG PO TABS: 1.0000 | ORAL_TABLET | ORAL | 0 refills | Status: DC | PRN

## 2018-05-16 MED ORDER — AMOXICILLIN-POT CLAVULANATE 875-125 MG PO TABS: 1.0000 | ORAL_TABLET | Freq: Two times a day (BID) | ORAL | 0 refills | Status: DC

## 2018-05-16 MED ORDER — PIPERACILLIN-TAZOBACTAM 3.375 G IVPB 30 MIN
3.3750 g | Freq: Once | INTRAVENOUS | Status: AC
  Administered 2018-05-16: 3.375 g via INTRAVENOUS
  Filled 2018-05-16: qty 50

## 2018-05-16 MED ORDER — ONDANSETRON 4 MG PO TBDP: 4.0000 mg | ORAL_TABLET | Freq: Three times a day (TID) | ORAL | 0 refills | Status: DC | PRN

## 2018-05-16 MED ORDER — IOHEXOL 300 MG/ML  SOLN
100.0000 mL | Freq: Once | INTRAMUSCULAR | Status: AC | PRN
  Administered 2018-05-16: 100 mL via INTRAVENOUS

## 2018-05-16 NOTE — Progress Notes (Signed)
CSW consulted as pt is in need of ride home. CSW spoke with pt at beside where pt expressed that pt's daughter is unable to pick pt up and pt does not meet criteria for PTAR transport back home. CSW has provided pt with taxi to listed address at this time as approved by team lead.    No further CSW needs. CSW signing off.    Virgie Dad Darlisa Spruiell, MSW, [place] [facility] Clinical Social Worker [phone]

## 2018-05-16 NOTE — ED Triage Notes (Signed)
Pt BIB [facility], c/o LLQ pain and nausea that started at 1800 last night. Tenderness on palpation.

## 2018-05-16 NOTE — ED Notes (Signed)
Patient verbalizes understanding of discharge instructions. Opportunity for questioning and answers were provided. 

## 2018-05-16 NOTE — ED Provider Notes (Signed)
[facility] EMERGENCY DEPARTMENT Provider Note   CSN: [id number] Arrival date & time: 05/16/18  0310     History   Chief Complaint Chief Complaint  Patient presents with  . Abdominal Pain    HPI Courtney Evans is a 81 y.o. female.  The history is provided by the patient and medical records.  Abdominal Pain   Associated symptoms include nausea.     82 y.o. F with hx of AFIB, CAD, DM2, HLP, HTN, pacemaker placement, presenting to the ED for lower abdominal pain.  States began yesterday afternoon but started getting a lot worse last night.  Pain mostly localized to left lower abdomen.  She reports nausea but denies vomiting.  No BM today but had one yesterday that was normal.  She has been able to pass gas. No difficulty urinating.  No dysuria or hematuria.  No reported fever/chills.  Denies prior abdominal surgeries.  Given zofran with EMS and nausea is better.  Past Medical History:  Diagnosis Date  . A-fib ([place])   . Compression fracture   . Coronary artery disease   . Diabetes mellitus without complication ([place])   . Hyperlipidemia   . Hypertension   . Pacemaker     Patient Active Problem List   Diagnosis Date Noted  . Aortic valve prosthesis present 05/10/2018  . Permanent atrial fibrillation 05/10/2018  . PAH (pulmonary artery hypertension) ([place]) 05/10/2018  . Pacemaker 05/10/2018  . Long term current use of anticoagulant 05/10/2018  . Hypercholesterolemia 05/10/2018  . Essential hypertension 05/10/2018  . Subtherapeutic international normalized ratio (INR)   . Back pain 10/19/2016  . Hypoxia 10/19/2016  . Type 2 diabetes mellitus without complication ([place]) 63/06/6008  . Chronic diastolic heart failure ([place]) 10/19/2016  . Breast hematoma 10/19/2016  . Anemia 08/26/2016  . Rheumatic heart disease 02/19/2016  . LBBB (left bundle branch block) 02/19/2016  . Pneumonia 11/19/2015  . Sepsis due to pneumonia ([place]) 11/19/2015  . CAP (community  acquired pneumonia) 11/19/2015  . Coagulopathy ([place]) 11/19/2015  . Chronic atrial fibrillation 11/19/2015  . Lactic acidosis 11/19/2015  . H/O mitral valve replacement with mechanical valve 11/19/2015    Past Surgical History:  Procedure Laterality Date  . AORTIC AND MITRAL VALVE REPLACEMENT  1994   w/ mechanical valves at [facility]  . BACK SURGERY    . CARDIAC SURGERY    . KYPHOPLASTY  2011   1 - at [facility]  . KYPHOPLASTY  2012   2 - at [facility]  . KYPHOPLASTY  2013   1 - at [facility]  . KYPHOPLASTY  05/2015   1 - at [place]  . PACEMAKER GENERATOR CHANGE  2013  . [place]     [place] History   None      Home Medications    Prior to Admission medications   Medication Sig Start Date End Date Taking? Authorizing Provider  acetaminophen (TYLENOL) 500 MG tablet Take 500-1,000 mg by mouth every 6 (six) hours as needed for mild pain, moderate pain, fever or headache.    [provider]  amLODipine (NORVASC) 5 MG tablet Take 5 mg by mouth daily.     [provider]  atorvastatin (LIPITOR) 20 MG tablet Take 20 mg by mouth daily.     [provider]  cholecalciferol (VITAMIN D) 1000 units  tablet Take 2,000 Units by mouth daily.     [provider]  docusate sodium (COLACE) 100 MG capsule Take 1 capsule (100 mg total) every 12 (twelve) hours by mouth. Patient taking differently: Take 100 mg by mouth daily as needed.  05/08/17   Jola Schmidt, MD  nitroGLYCERIN (NITROSTAT) 0.4 MG SL tablet Place 0.4 mg under the tongue every 5 (five) minutes as needed for chest pain.    [provider]  polyethylene glycol (MIRALAX / GLYCOLAX) packet Take 17 g by mouth daily as needed for mild constipation.    [provider]  warfarin (COUMADIN) 3 MG tablet Take 1.5-3 mg by mouth every evening.  Pt takes one-half tablet on Monday and one tablet all other days.    [provider]    Family History Family History  Problem Relation Age of Onset  . Heart attack Mother   . Stroke Father     Social History Social History   Tobacco Use  . Smoking status: Never Smoker  . Smokeless tobacco: Never Used  Substance Use Topics  . Alcohol use: No  . Drug use: No     Allergies   Codeine   Review of Systems Review of Systems  Gastrointestinal: Positive for abdominal pain and nausea.  All other systems reviewed and are negative.    Physical Exam Updated Vital Signs BP (!) 164/68 (BP Location: Right Arm)   Pulse 77   Temp 99.9 F (37.7 C) (Oral)   Resp 18   SpO2 93%   Physical Exam  Constitutional: She is oriented to person, place, and time. She appears well-developed and well-nourished.  HENT:  Head: Normocephalic and atraumatic.  Mouth/Throat: Oropharynx is clear and moist.  Eyes: Pupils are equal, round, and reactive to light. Conjunctivae and EOM are normal.  Neck: Normal range of motion.  Cardiovascular: Normal rate, regular rhythm and normal heart sounds.  Pulmonary/Chest: Effort normal and breath sounds normal.  Abdominal: Soft. Bowel sounds are normal. There is tenderness in the left lower quadrant.    Abdomen appears somewhat distended and firm but not rigid; tenderness in LLQ and somewhat into the suprapubic region;  Normal bowel sounds  Musculoskeletal: Normal range of motion.  Neurological: She is alert and oriented to person, place, and time.  Skin: Skin is warm and dry.  Psychiatric: She has a normal mood and affect.  Nursing note and vitals reviewed.    ED Treatments / Results  Labs (all labs ordered are listed, but only abnormal results are displayed) Labs Reviewed  CBC WITH DIFFERENTIAL/PLATELET - Abnormal; Notable for the following components:      Result Value   WBC 11.7 (*)    Neutro Abs 9.8 (*)    All other components within  normal limits  COMPREHENSIVE METABOLIC PANEL - Abnormal; Notable for the following components:   Glucose, Bld 146 (*)    Total Bilirubin 1.5 (*)    GFR calc non Af Amer 55 (*)    All other components within normal limits  PROTIME-INR - Abnormal; Notable for the following components:   Prothrombin Time 32.4 (*)    All other components within normal limits  LIPASE, BLOOD  URINALYSIS, ROUTINE W REFLEX MICROSCOPIC  I-STAT CG4 LACTIC ACID, ED  I-STAT CG4 LACTIC ACID, ED    EKG None  Radiology Ct Abdomen Pelvis W Contrast  Result Date: 05/16/2018 CLINICAL DATA:  Left lower quadrant pain and nausea for 2 days. History of diabetes. EXAM: CT ABDOMEN AND  PELVIS WITH CONTRAST TECHNIQUE: Multidetector CT imaging of the abdomen and pelvis was performed using the standard protocol following bolus administration of intravenous contrast. CONTRAST:  182mL OMNIPAQUE IOHEXOL 300 MG/ML  SOLN COMPARISON:  None. FINDINGS: Lower chest: Small bilateral pleural effusions with basilar atelectasis. Diffuse cardiac enlargement. Cardiac valve prostheses. Hepatobiliary: No focal liver lesions. Gallbladder is mildly distended without wall thickening or inflammatory change. No gallstones. No bile duct dilatation. Pancreas: Fatty infiltration of the pancreas. No acute process. Spleen: Splenic calcification. No focal lesion. Adrenals/Urinary Tract: Adrenal glands are unremarkable. Kidneys are normal, without renal calculi, focal lesion, or hydronephrosis. Bladder is unremarkable. Stomach/Bowel: Stomach, small bowel, and colon are not abnormally distended. Diffuse wall thickening involving the descending colon and transverse colon. Mild stranding in the fat around the descending colon. Changes are consistent with colitis, possibly infectious or inflammatory. Consider ulcerative colitis. Ischemic colitis in the distribution of the inferior mesenteric artery could also have this apparent but there is no definite evidence of  vascular compromise. No pneumatosis. Appendix is normal. Vascular/Lymphatic: Aortic atherosclerosis. No enlarged abdominal or pelvic lymph nodes. Reproductive: Uterus and bilateral adnexa are unremarkable. Other: No free air or free fluid in the abdomen. Abdominal wall musculature appears intact. Musculoskeletal: Multiple vertebral compression deformities post kyphoplasty. Likely osteoporosis. IMPRESSION: Diffuse wall thickening involving the descending colon and transverse colon. Changes consistent with colitis, probably infectious or inflammatory. Ischemic colitis less likely. Small bilateral pleural effusions with basilar atelectasis. Electronically Signed   By: Lucienne Capers M.D.   On: 05/16/2018 05:12    Procedures Procedures (including critical care time)  Medications Ordered in ED Medications  fentaNYL (SUBLIMAZE) injection 50 mcg (50 mcg Intravenous Given 05/16/18 0319)  iohexol (OMNIPAQUE) 300 MG/ML solution 100 mL (100 mLs Intravenous Contrast Given 05/16/18 0442)  piperacillin-tazobactam (ZOSYN) IVPB 3.375 g (3.375 g Intravenous New Bag/Given 05/16/18 0545)     Initial Impression / Assessment and Plan / ED Course  I have reviewed the triage vital signs and the nursing notes.  Pertinent labs & imaging results that were available during my care of the patient were reviewed by me and considered in my medical decision making (see chart for details).  82 y.o. F here with lower abdominal pain, gradually worsening since yesterday.  She has low grade fever here but non-toxic in appearance.  Abdomen is distended with tenderness mostly in the LLQ, somewhat suprapubic region.  Plan for labs, CT.  zofran given with EMS with improvement of nausea, given dose of fentanyl for pain.  Labs overall reassuring-- normal lactate, WBC count 11K.  Chemistry reassuring, lipase WNL.  CT with findings of colitis of descending and transverse colon, likely inflammatory vs infectious.  Given low grade fever,  suspect infectious etiology.  Feel less likely ischemic colitis given normal lactate, pain not out of proportion, and maintains stable VS.  Results discussed with patient, states she is feeling better.  She would like to go home, feel this is reasonable.  Given dose of zosyn here, will d/c home with Augmentin along with symptomatic control.  Will have her follow-up closely with her primary care doctor.  She will return here for any new/acute changes.  Patient seen and evaluated with attending physician, Dr. Betsey Holiday, who agrees with assessment and plan of care.  Final Clinical Impressions(s) / ED Diagnoses   Final diagnoses:  Colitis    ED Discharge Orders         Ordered    amoxicillin-clavulanate (AUGMENTIN) 875-125 MG tablet  Every 12 hours  05/16/18 0542    HYDROcodone-acetaminophen (NORCO/VICODIN) 5-325 MG tablet  Every 4 hours PRN     05/16/18 0542    ondansetron (ZOFRAN ODT) 4 MG disintegrating tablet  Every 8 hours PRN     05/16/18 0542           Larene Pickett, PA-C 05/16/18 2194    Orpah Greek, MD 05/16/18 [phone]

## 2018-05-16 NOTE — ED Notes (Signed)
Waiting SW to see pt for cab voucher request if possible, pt does not meet transport criteria per PTAR and pts daughter declines picking the pt up per the RN and Charge Rn, pt aware of plan breakfast tray ordered

## 2018-05-16 NOTE — ED Provider Notes (Signed)
Patient presented to the ER with abdominal pain.  Patient with progressively worsening pain, mostly left lower quadrant over the last 2 days.  Nausea but no vomiting.  Face to face Exam: HEENT - PERRLA Lungs - CTAB Heart - RRR, no M/R/G Abd -diffuse mild distention, diffuse tenderness Neuro - alert, oriented x3  Plan: CT abdomen and pelvis.   Orpah Greek, MD 05/16/18 [phone]

## 2018-05-16 NOTE — Discharge Instructions (Addendum)
Take the prescribed medication as directed.  Try to push oral fluids at home, gentle diet for now and progress back to normal as tolerated. Follow-up with your primary care doctor. Return to the ED for new or worsening symptoms.

## 2018-05-18 DIAGNOSIS — Z952 Presence of prosthetic heart valve: Secondary | ICD-10-CM | POA: Diagnosis not present

## 2018-05-18 DIAGNOSIS — I480 Paroxysmal atrial fibrillation: Secondary | ICD-10-CM | POA: Diagnosis not present

## 2018-05-18 DIAGNOSIS — E118 Type 2 diabetes mellitus with unspecified complications: Secondary | ICD-10-CM | POA: Diagnosis not present

## 2018-05-18 DIAGNOSIS — E782 Mixed hyperlipidemia: Secondary | ICD-10-CM | POA: Diagnosis not present

## 2018-05-18 DIAGNOSIS — F324 Major depressive disorder, single episode, in partial remission: Secondary | ICD-10-CM | POA: Diagnosis not present

## 2018-05-18 DIAGNOSIS — G3184 Mild cognitive impairment, so stated: Secondary | ICD-10-CM | POA: Diagnosis not present

## 2018-05-18 DIAGNOSIS — K529 Noninfective gastroenteritis and colitis, unspecified: Secondary | ICD-10-CM | POA: Diagnosis not present

## 2018-05-18 DIAGNOSIS — E559 Vitamin D deficiency, unspecified: Secondary | ICD-10-CM | POA: Diagnosis not present

## 2018-05-18 DIAGNOSIS — R338 Other retention of urine: Secondary | ICD-10-CM | POA: Diagnosis not present

## 2018-05-18 DIAGNOSIS — N812 Incomplete uterovaginal prolapse: Secondary | ICD-10-CM | POA: Diagnosis not present

## 2018-05-18 DIAGNOSIS — Z7901 Long term (current) use of anticoagulants: Secondary | ICD-10-CM | POA: Diagnosis not present

## 2018-05-18 DIAGNOSIS — I10 Essential (primary) hypertension: Secondary | ICD-10-CM | POA: Diagnosis not present

## 2018-05-18 DIAGNOSIS — G8929 Other chronic pain: Secondary | ICD-10-CM | POA: Diagnosis not present

## 2018-05-18 DIAGNOSIS — M810 Age-related osteoporosis without current pathological fracture: Secondary | ICD-10-CM | POA: Diagnosis not present

## 2018-06-01 DIAGNOSIS — Z952 Presence of prosthetic heart valve: Secondary | ICD-10-CM | POA: Diagnosis not present

## 2018-06-01 DIAGNOSIS — Z7901 Long term (current) use of anticoagulants: Secondary | ICD-10-CM | POA: Diagnosis not present

## 2018-06-08 ENCOUNTER — Ambulatory Visit (INDEPENDENT_AMBULATORY_CARE_PROVIDER_SITE_OTHER): Payer: PPO

## 2018-06-08 ENCOUNTER — Telehealth: Payer: Self-pay | Admitting: Cardiology

## 2018-06-08 DIAGNOSIS — I4821 Permanent atrial fibrillation: Secondary | ICD-10-CM

## 2018-06-08 NOTE — Telephone Encounter (Signed)
Spoke with pt and reminded pt of remote transmission that is due today. Pt verbalized understanding.   

## 2018-06-11 NOTE — Progress Notes (Signed)
Remote pacemaker transmission.   

## 2018-06-12 ENCOUNTER — Encounter: Payer: Self-pay | Admitting: Cardiology

## 2018-06-29 DIAGNOSIS — Z7901 Long term (current) use of anticoagulants: Secondary | ICD-10-CM | POA: Diagnosis not present

## 2018-07-06 DIAGNOSIS — Z7901 Long term (current) use of anticoagulants: Secondary | ICD-10-CM | POA: Diagnosis not present

## 2018-07-19 LAB — CUP PACEART REMOTE DEVICE CHECK
Brady Statistic RV Percent Paced: 53 %
Implantable Lead Implant Date: 19980924
Lead Channel Impedance Value: 1139 Ohm
Lead Channel Pacing Threshold Amplitude: 1 V
Lead Channel Setting Pacing Amplitude: 2 V
Lead Channel Setting Pacing Pulse Width: 0.4 ms
Lead Channel Setting Sensing Sensitivity: 5.6 mV
MDC IDC LEAD LOCATION: 753860
MDC IDC MSMT BATTERY IMPEDANCE: 2852 Ohm
MDC IDC MSMT BATTERY REMAINING LONGEVITY: 26 mo
MDC IDC MSMT BATTERY VOLTAGE: 2.75 V
MDC IDC MSMT LEADCHNL RA IMPEDANCE VALUE: 0 Ohm
MDC IDC MSMT LEADCHNL RV PACING THRESHOLD PULSEWIDTH: 0.4 ms
MDC IDC PG IMPLANT DT: 20130429
MDC IDC SESS DTM: 20191215000350

## 2018-08-10 DIAGNOSIS — Z7901 Long term (current) use of anticoagulants: Secondary | ICD-10-CM | POA: Diagnosis not present

## 2018-08-18 DIAGNOSIS — N390 Urinary tract infection, site not specified: Secondary | ICD-10-CM | POA: Diagnosis not present

## 2018-08-24 DIAGNOSIS — E119 Type 2 diabetes mellitus without complications: Secondary | ICD-10-CM | POA: Diagnosis not present

## 2018-08-24 DIAGNOSIS — H26491 Other secondary cataract, right eye: Secondary | ICD-10-CM | POA: Diagnosis not present

## 2018-08-24 DIAGNOSIS — H26493 Other secondary cataract, bilateral: Secondary | ICD-10-CM | POA: Diagnosis not present

## 2018-08-28 DIAGNOSIS — H3589 Other specified retinal disorders: Secondary | ICD-10-CM | POA: Diagnosis not present

## 2018-08-28 DIAGNOSIS — H26492 Other secondary cataract, left eye: Secondary | ICD-10-CM | POA: Diagnosis not present

## 2018-09-10 ENCOUNTER — Other Ambulatory Visit: Payer: Self-pay

## 2018-09-10 ENCOUNTER — Encounter: Payer: PPO | Admitting: *Deleted

## 2018-09-11 ENCOUNTER — Telehealth: Payer: Self-pay

## 2018-09-11 NOTE — Telephone Encounter (Signed)
Left message for patient to remind of missed remote transmission.  

## 2018-09-14 ENCOUNTER — Telehealth: Payer: Self-pay

## 2018-09-14 DIAGNOSIS — I480 Paroxysmal atrial fibrillation: Secondary | ICD-10-CM | POA: Diagnosis not present

## 2018-09-14 DIAGNOSIS — M810 Age-related osteoporosis without current pathological fracture: Secondary | ICD-10-CM | POA: Diagnosis not present

## 2018-09-14 DIAGNOSIS — Z7901 Long term (current) use of anticoagulants: Secondary | ICD-10-CM | POA: Diagnosis not present

## 2018-09-14 DIAGNOSIS — Z952 Presence of prosthetic heart valve: Secondary | ICD-10-CM | POA: Diagnosis not present

## 2018-09-14 NOTE — Telephone Encounter (Signed)
Left message for patient to remind of missed remote transmission.  

## 2018-09-25 DIAGNOSIS — Z7901 Long term (current) use of anticoagulants: Secondary | ICD-10-CM | POA: Diagnosis not present

## 2018-10-12 ENCOUNTER — Ambulatory Visit (INDEPENDENT_AMBULATORY_CARE_PROVIDER_SITE_OTHER): Payer: PPO | Admitting: *Deleted

## 2018-10-12 DIAGNOSIS — I5032 Chronic diastolic (congestive) heart failure: Secondary | ICD-10-CM

## 2018-10-12 DIAGNOSIS — I4821 Permanent atrial fibrillation: Secondary | ICD-10-CM | POA: Diagnosis not present

## 2018-10-13 LAB — CUP PACEART REMOTE DEVICE CHECK
Battery Impedance: 3366 Ohm
Battery Remaining Longevity: 20 mo
Battery Voltage: 2.74 V
Brady Statistic RV Percent Paced: 53 %
Date Time Interrogation Session: 20200417200027
Implantable Lead Implant Date: 19980924
Implantable Lead Location: 753860
Implantable Lead Model: 5068
Implantable Pulse Generator Implant Date: 20130429
Lead Channel Impedance Value: 0 Ohm
Lead Channel Impedance Value: 1195 Ohm
Lead Channel Pacing Threshold Amplitude: 1.125 V
Lead Channel Pacing Threshold Pulse Width: 0.4 ms
Lead Channel Setting Pacing Amplitude: 2.25 V
Lead Channel Setting Pacing Pulse Width: 0.4 ms
Lead Channel Setting Sensing Sensitivity: 5.6 mV

## 2018-10-15 ENCOUNTER — Other Ambulatory Visit: Payer: Self-pay

## 2018-10-17 NOTE — Progress Notes (Signed)
Remote pacemaker transmission.   

## 2018-10-23 DIAGNOSIS — Z7901 Long term (current) use of anticoagulants: Secondary | ICD-10-CM | POA: Diagnosis not present

## 2018-10-23 DIAGNOSIS — M810 Age-related osteoporosis without current pathological fracture: Secondary | ICD-10-CM | POA: Diagnosis not present

## 2018-11-23 DIAGNOSIS — Z952 Presence of prosthetic heart valve: Secondary | ICD-10-CM | POA: Diagnosis not present

## 2018-11-23 DIAGNOSIS — I480 Paroxysmal atrial fibrillation: Secondary | ICD-10-CM | POA: Diagnosis not present

## 2018-11-23 DIAGNOSIS — E118 Type 2 diabetes mellitus with unspecified complications: Secondary | ICD-10-CM | POA: Diagnosis not present

## 2018-11-23 DIAGNOSIS — Z7901 Long term (current) use of anticoagulants: Secondary | ICD-10-CM | POA: Diagnosis not present

## 2018-11-23 DIAGNOSIS — E782 Mixed hyperlipidemia: Secondary | ICD-10-CM | POA: Diagnosis not present

## 2018-11-23 DIAGNOSIS — I10 Essential (primary) hypertension: Secondary | ICD-10-CM | POA: Diagnosis not present

## 2018-11-23 DIAGNOSIS — M810 Age-related osteoporosis without current pathological fracture: Secondary | ICD-10-CM | POA: Diagnosis not present

## 2018-11-23 DIAGNOSIS — F324 Major depressive disorder, single episode, in partial remission: Secondary | ICD-10-CM | POA: Diagnosis not present

## 2018-11-23 DIAGNOSIS — E559 Vitamin D deficiency, unspecified: Secondary | ICD-10-CM | POA: Diagnosis not present

## 2018-11-23 DIAGNOSIS — K529 Noninfective gastroenteritis and colitis, unspecified: Secondary | ICD-10-CM | POA: Diagnosis not present

## 2018-11-23 DIAGNOSIS — G8929 Other chronic pain: Secondary | ICD-10-CM | POA: Diagnosis not present

## 2018-11-24 DIAGNOSIS — N3001 Acute cystitis with hematuria: Secondary | ICD-10-CM | POA: Diagnosis not present

## 2018-11-24 DIAGNOSIS — N3090 Cystitis, unspecified without hematuria: Secondary | ICD-10-CM | POA: Diagnosis not present

## 2018-12-01 ENCOUNTER — Emergency Department (HOSPITAL_COMMUNITY)
Admission: EM | Admit: 2018-12-01 | Discharge: 2018-12-01 | Disposition: A | Discharge status: Home / Self Care | Payer: PPO | Attending: Emergency Medicine | Admitting: Emergency Medicine

## 2018-12-01 ENCOUNTER — Other Ambulatory Visit: Payer: Self-pay

## 2018-12-01 ENCOUNTER — Encounter (HOSPITAL_COMMUNITY): Payer: Self-pay

## 2018-12-01 ENCOUNTER — Emergency Department (HOSPITAL_COMMUNITY): Payer: PPO

## 2018-12-01 DIAGNOSIS — R0902 Hypoxemia: Secondary | ICD-10-CM | POA: Diagnosis not present

## 2018-12-01 DIAGNOSIS — E119 Type 2 diabetes mellitus without complications: Secondary | ICD-10-CM | POA: Diagnosis not present

## 2018-12-01 DIAGNOSIS — I700 Atherosclerosis of aorta: Secondary | ICD-10-CM | POA: Diagnosis not present

## 2018-12-01 DIAGNOSIS — R1084 Generalized abdominal pain: Secondary | ICD-10-CM | POA: Diagnosis not present

## 2018-12-01 DIAGNOSIS — N329 Bladder disorder, unspecified: Secondary | ICD-10-CM | POA: Diagnosis not present

## 2018-12-01 DIAGNOSIS — M439 Deforming dorsopathy, unspecified: Secondary | ICD-10-CM | POA: Diagnosis not present

## 2018-12-01 DIAGNOSIS — I10 Essential (primary) hypertension: Secondary | ICD-10-CM | POA: Diagnosis not present

## 2018-12-01 DIAGNOSIS — Z3044 Encounter for surveillance of vaginal ring hormonal contraceptive device: Secondary | ICD-10-CM | POA: Diagnosis not present

## 2018-12-01 DIAGNOSIS — I517 Cardiomegaly: Secondary | ICD-10-CM | POA: Diagnosis not present

## 2018-12-01 DIAGNOSIS — N134 Hydroureter: Secondary | ICD-10-CM | POA: Diagnosis not present

## 2018-12-01 DIAGNOSIS — Z79899 Other long term (current) drug therapy: Secondary | ICD-10-CM | POA: Insufficient documentation

## 2018-12-01 DIAGNOSIS — I2510 Atherosclerotic heart disease of native coronary artery without angina pectoris: Secondary | ICD-10-CM | POA: Diagnosis not present

## 2018-12-01 DIAGNOSIS — Z7901 Long term (current) use of anticoagulants: Secondary | ICD-10-CM | POA: Insufficient documentation

## 2018-12-01 DIAGNOSIS — R0602 Shortness of breath: Secondary | ICD-10-CM | POA: Insufficient documentation

## 2018-12-01 DIAGNOSIS — R1030 Lower abdominal pain, unspecified: Secondary | ICD-10-CM | POA: Diagnosis not present

## 2018-12-01 DIAGNOSIS — R52 Pain, unspecified: Secondary | ICD-10-CM | POA: Diagnosis not present

## 2018-12-01 DIAGNOSIS — N1330 Unspecified hydronephrosis: Secondary | ICD-10-CM | POA: Diagnosis not present

## 2018-12-01 DIAGNOSIS — Z950 Presence of cardiac pacemaker: Secondary | ICD-10-CM | POA: Diagnosis not present

## 2018-12-01 DIAGNOSIS — N3001 Acute cystitis with hematuria: Secondary | ICD-10-CM | POA: Insufficient documentation

## 2018-12-01 DIAGNOSIS — M5136 Other intervertebral disc degeneration, lumbar region: Secondary | ICD-10-CM | POA: Diagnosis not present

## 2018-12-01 DIAGNOSIS — R300 Dysuria: Secondary | ICD-10-CM | POA: Diagnosis not present

## 2018-12-01 LAB — CBC WITH DIFFERENTIAL/PLATELET
Abs Immature Granulocytes: 0.1 10*3/uL — ABNORMAL HIGH (ref 0.00–0.07)
Basophils Absolute: 0.1 10*3/uL (ref 0.0–0.1)
Basophils Relative: 1 %
Eosinophils Absolute: 0 10*3/uL (ref 0.0–0.5)
Eosinophils Relative: 0 %
HCT: 38.3 % (ref 36.0–46.0)
Hemoglobin: 12.9 g/dL (ref 12.0–15.0)
Immature Granulocytes: 1 %
Lymphocytes Relative: 9 %
Lymphs Abs: 1 10*3/uL (ref 0.7–4.0)
MCH: 32.6 pg (ref 26.0–34.0)
MCHC: 33.7 g/dL (ref 30.0–36.0)
MCV: 96.7 fL (ref 80.0–100.0)
Monocytes Absolute: 1 10*3/uL (ref 0.1–1.0)
Monocytes Relative: 9 %
Neutro Abs: 8.9 10*3/uL — ABNORMAL HIGH (ref 1.7–7.7)
Neutrophils Relative %: 80 %
Platelets: 195 10*3/uL (ref 150–400)
RBC: 3.96 MIL/uL (ref 3.87–5.11)
RDW: 12.8 % (ref 11.5–15.5)
WBC: 11.1 10*3/uL — ABNORMAL HIGH (ref 4.0–10.5)
nRBC: 0 % (ref 0.0–0.2)

## 2018-12-01 LAB — URINALYSIS, ROUTINE W REFLEX MICROSCOPIC
Bilirubin Urine: NEGATIVE
Glucose, UA: NEGATIVE mg/dL
Ketones, ur: NEGATIVE mg/dL
Nitrite: NEGATIVE
Protein, ur: 30 mg/dL — AB
RBC / HPF: >50 RBC/hpf — ABNORMAL HIGH (ref 0–5)
Specific Gravity, Urine: 1.009 (ref 1.005–1.030)
WBC, UA: >50 WBC/hpf — ABNORMAL HIGH (ref 0–5)
pH: 6 (ref 5.0–8.0)

## 2018-12-01 LAB — COMPREHENSIVE METABOLIC PANEL
ALT: 14 U/L (ref 0–44)
AST: 28 U/L (ref 15–41)
Albumin: 4.2 g/dL (ref 3.5–5.0)
Alkaline Phosphatase: 75 U/L (ref 38–126)
Anion gap: 10 (ref 5–15)
BUN: 22 mg/dL (ref 8–23)
CO2: 24 mmol/L (ref 22–32)
Calcium: 9 mg/dL (ref 8.9–10.3)
Chloride: 97 mmol/L — ABNORMAL LOW (ref 98–111)
Creatinine, Ser: 0.8 mg/dL (ref 0.44–1.00)
GFR calc Af Amer: >60 mL/min (ref >60)
GFR calc non Af Amer: >60 mL/min (ref >60)
Glucose, Bld: 169 mg/dL — ABNORMAL HIGH (ref 70–99)
Potassium: 4.2 mmol/L (ref 3.5–5.1)
Sodium: 131 mmol/L — ABNORMAL LOW (ref 135–145)
Total Bilirubin: 0.7 mg/dL (ref 0.3–1.2)
Total Protein: 7.1 g/dL (ref 6.5–8.1)

## 2018-12-01 LAB — LACTIC ACID, PLASMA: Lactic Acid, Venous: 1.9 mmol/L (ref 0.5–1.9)

## 2018-12-01 LAB — PROTIME-INR
INR: 2.4 — ABNORMAL HIGH (ref 0.8–1.2)
Prothrombin Time: 25.7 seconds — ABNORMAL HIGH (ref 11.4–15.2)

## 2018-12-01 MED ORDER — ONDANSETRON HCL 4 MG/2ML IJ SOLN
4.0000 mg | Freq: Once | INTRAMUSCULAR | Status: AC
  Administered 2018-12-01: 4 mg via INTRAVENOUS
  Filled 2018-12-01: qty 2

## 2018-12-01 MED ORDER — PROBIOTIC PO CAPS: 1.0000 | ORAL_CAPSULE | Freq: Every day | ORAL | 0 refills | Status: AC

## 2018-12-01 MED ORDER — ONDANSETRON 4 MG PO TBDP: 4.0000 mg | ORAL_TABLET | Freq: Four times a day (QID) | ORAL | 0 refills | Status: AC | PRN

## 2018-12-01 MED ORDER — SODIUM CHLORIDE 0.9 % IV SOLN
1.0000 g | Freq: Once | INTRAVENOUS | Status: AC
  Administered 2018-12-01: 1 g via INTRAVENOUS
  Filled 2018-12-01: qty 10

## 2018-12-01 MED ORDER — IOHEXOL 300 MG/ML  SOLN
100.0000 mL | Freq: Once | INTRAMUSCULAR | Status: AC | PRN
  Administered 2018-12-01: 100 mL via INTRAVENOUS

## 2018-12-01 MED ORDER — SODIUM CHLORIDE (PF) 0.9 % IJ SOLN
INTRAMUSCULAR | Status: AC
  Filled 2018-12-01: qty 50

## 2018-12-01 MED ORDER — CEPHALEXIN 500 MG PO CAPS: 500.0000 mg | ORAL_CAPSULE | Freq: Three times a day (TID) | ORAL | 0 refills | Status: DC

## 2018-12-01 MED ORDER — IOHEXOL 300 MG/ML  SOLN
30.0000 mL | Freq: Once | INTRAMUSCULAR | Status: AC | PRN
  Administered 2018-12-01: 30 mL via ORAL

## 2018-12-01 NOTE — ED Provider Notes (Signed)
TIME SEEN: 1:47 AM  CHIEF COMPLAINT: Abdominal pain, nausea, dysuria  HPI: Patient is an 83 year old female with history of CAD, hypertension, diabetes, hyperlipidemia, pacemaker and atrial fibrillation on Coumadin who presents to the emergency department with lower abdominal pain that she describes as pressure.  States this is been ongoing for a week with lower back pain, nausea.  She is not sure if she has had fever but feels warm to touch here.  Reports chronic shortness of breath but no chest pain or cough.  No vomiting or diarrhea.  States she has had some dysuria.  Was seen at urgent care 6 days ago and put on Macrobid and states symptoms are worsening.  Has had colitis.  No previous history of abdominal surgery.  ROS: See HPI Constitutional: no fever  Eyes: no drainage  ENT: no runny nose   Cardiovascular:  no chest pain  Resp: no SOB  GI: no vomiting GU:  dysuria Integumentary: no rash  Allergy: no hives  Musculoskeletal: no leg swelling  Neurological: no slurred speech ROS otherwise negative  PAST MEDICAL HISTORY/PAST SURGICAL HISTORY:  Past Medical History:  Diagnosis Date  . A-fib ([place])   . Compression fracture   . Coronary artery disease   . Diabetes mellitus without complication ([place])   . Hyperlipidemia   . Hypertension   . Pacemaker     MEDICATIONS:  Prior to Admission medications   Medication Sig Start Date End Date Taking? Authorizing Provider  acetaminophen (TYLENOL) 500 MG tablet Take 500-1,000 mg by mouth every 6 (six) hours as needed for mild pain, moderate pain, fever or headache.    [provider]  amLODipine (NORVASC) 5 MG tablet Take 5 mg by mouth daily.     [provider]  amoxicillin-clavulanate (AUGMENTIN) 875-125 MG tablet Take 1 tablet by mouth every 12 (twelve) hours. 05/16/18   Recardo Evangelist, PA-C  atorvastatin (LIPITOR) 20 MG tablet Take 20 mg by mouth daily.     [provider]  cholecalciferol (VITAMIN D) 1000  units tablet Take 2,000 Units by mouth daily.     [provider]  docusate sodium (COLACE) 100 MG capsule Take 1 capsule (100 mg total) every 12 (twelve) hours by mouth. Patient not taking: Reported on 05/16/2018 05/08/17   Jola Schmidt, MD  DULoxetine (CYMBALTA) 60 MG capsule Take 60 mg by mouth daily. 04/20/18   [provider]  HYDROcodone-acetaminophen (NORCO/VICODIN) 5-325 MG tablet Take 1 tablet by mouth every 4 (four) hours as needed. 05/16/18   Recardo Evangelist, PA-C  nitroGLYCERIN (NITROSTAT) 0.4 MG SL tablet Place 0.4 mg under the tongue every 5 (five) minutes as needed for chest pain.    [provider]  ondansetron (ZOFRAN ODT) 4 MG disintegrating tablet Take 1 tablet (4 mg total) by mouth every 8 (eight) hours as needed for nausea. 05/16/18   Recardo Evangelist, PA-C  warfarin (COUMADIN) 2.5 MG tablet Take 2.5 mg by mouth daily. 04/09/18   [provider]    ALLERGIES:  Allergies  Allergen Reactions  . Codeine Nausea And Vomiting    SOCIAL HISTORY:  Social History   Tobacco Use  . Smoking status: Never Smoker  . Smokeless tobacco: Never Used  Substance Use Topics  . Alcohol use: No    FAMILY HISTORY: Family History  Problem Relation Age of Onset  . Heart attack Mother   . Stroke Father     EXAM: BP (!) 161/66 (BP Location: Left Arm)   Pulse  75   Temp 99.1 F (37.3 C) (Oral)   Resp 18   Ht 5\' 4"  (1.626 m)   Wt 56.7 kg   SpO2 95%   BMI 21.46 kg/m  CONSTITUTIONAL: Alert and oriented and responds appropriately to questions.  Elderly, in no significant distress HEAD: Normocephalic EYES: Conjunctivae clear, pupils appear equal, EOMI ENT: normal nose; moist mucous membranes NECK: Supple, no meningismus, no nuchal rigidity, no LAD  CARD: RRR; S1 and S2 appreciated; no murmurs, no clicks, no rubs, no gallops RESP: Normal chest excursion without splinting or tachypnea; breath sounds clear and equal bilaterally; no wheezes,  no rhonchi, no rales, no hypoxia or respiratory distress, speaking full sentences ABD/GI: Normal bowel sounds; non-distended; soft, diffusely tender throughout the lower abdomen, no rebound, no guarding, no peritoneal signs, no hepatosplenomegaly BACK:  The back appears normal and is non-tender to palpation, there is no CVA tenderness EXT: Normal ROM in all joints; non-tender to palpation; no edema; normal capillary refill; no cyanosis, no calf tenderness or swelling    SKIN: Normal color for age and race; warm; no rash NEURO: Moves all extremities equally PSYCH: The patient's mood and manner are appropriate. Grooming and personal hygiene are appropriate.  MEDICAL DECISION MAKING: Patient here with complaints of abdominal pain, back pain, dysuria.  She feels very warm to touch.  Will obtain rectal temperature.  Has had colitis previously.  States she was treated for a "kidney infection" at urgent care with Macrobid.  No relief of symptoms.  Will obtain labs, urine and urine culture and also a CT of her abdomen pelvis to evaluate for pyelonephritis, kidney stone, colitis, appendicitis, bowel obstruction.  Will give Zofran.  She declines pain medication at this time.  ED PROGRESS: Patient's urine appears infected.  Urine culture pending.  She does have a mild leukocytosis of 11.1 with left shift.  No previous urine culture for comparison.  Will give IV ceftriaxone.  Rectal temperature 99.4.  CT scan pending to evaluate for infected kidney stone, pyelonephritis or other acute abdominal pathology in this elderly patient with multiple comorbidities.  CT scan shows rotated appearance of the vaginal pessary with mass-effect on the posterior urinary bladder.  There is mild right kidney pelviectasis and hydroureter without stranding or mass.  Radiology states it is possible that this rotated pessary could impinge on the right UVJ.  She states this pessary was placed over 2 years ago by her GYN and has not been  manipulated.  She is not sexually active.  I have low suspicion that this is being caused by her pessary but will have her follow-up with her GYN as an outpatient.  She reports feeling better.  Will discharge with Keflex.  Urine culture is pending.  She will call her daughter to pick her up from the ED.   At this time, I do not feel there is any life-threatening condition present. I have reviewed and discussed all results (EKG, imaging, lab, urine as appropriate) and exam findings with patient/family. I have reviewed nursing notes and appropriate previous records.  I feel the patient is safe to be discharged home without further emergent workup and can continue workup as an outpatient as needed. Discussed usual and customary return precautions. Patient/family verbalize understanding and are comfortable with this plan.  Outpatient follow-up has been provided as needed. All questions have been answered.      Ward, Delice Bison, DO 12/01/18 Delrae Rend

## 2018-12-01 NOTE — ED Triage Notes (Signed)
Pt reports burning with urination and lower abdominal pain for about 1 week. She reports that she was seen at urgent care for same last week. Reports compliance with antibiotics from that visit, but states that symptoms have not improved. A&Ox4.

## 2018-12-01 NOTE — ED Notes (Signed)
Patients daughter is coming to pick pt up.

## 2018-12-01 NOTE — ED Notes (Signed)
Pt is waiting on ride to get here

## 2018-12-01 NOTE — Discharge Instructions (Signed)
You have a urinary tract infection here.  Your labs were otherwise normal.  Your CT scan shows that your vaginal pessary may be rotated and pushing on your bladder and causing mild swelling of the right kidney.  I recommend close follow-up with your gynecologist to discuss further treatment such as removal of pessary, smaller pessary.  Please follow-up with your primary care physician in 1 week to have your urine rechecked.  If you develop fevers of 100.4 or higher despite being on antibiotics, worsening abdominal pain or back pain, vomiting that does not stop, confusion, please return to the emergency department.

## 2018-12-01 NOTE — ED Notes (Signed)
Bed: CJ67 Expected date:  Expected time:  Means of arrival:  Comments: 38F lower abd pain/ UTI

## 2018-12-01 NOTE — ED Notes (Signed)
Doctor at bedside.

## 2018-12-03 LAB — URINE CULTURE: Culture: >=100000 — AB

## 2018-12-04 ENCOUNTER — Telehealth: Payer: Self-pay | Admitting: Emergency Medicine

## 2018-12-04 NOTE — Telephone Encounter (Signed)
Post ED Visit - Positive Culture Follow-up  Culture report reviewed by antimicrobial stewardship pharmacist: [place] Team []  Elenor Quinones, Pharm.D. []  Heide Guile, Pharm.D., BCPS AQ-ID []  Parks Neptune, Pharm.D., BCPS []  Alycia Rossetti, Pharm.D., BCPS []  [place], Pharm.D., BCPS, AAHIVP []  Legrand Como, Pharm.D., BCPS, AAHIVP []  Salome Arnt, PharmD, BCPS []  Johnnette Gourd, PharmD, BCPS []  Hughes Better, PharmD, BCPS []  Leeroy Cha, PharmD []  Laqueta Linden, PharmD, BCPS []  Albertina Parr, PharmD  [place] Team []  Leodis Sias, PharmD []  Lindell Spar, PharmD []  Royetta Asal, PharmD []  Graylin Shiver, Rph []  Rema Fendt) Glennon Mac, PharmD []  Arlyn Dunning, PharmD []  Netta Cedars, PharmD [x]  Dia Sitter, PharmD []  Leone Haven, PharmD []  Gretta Arab, PharmD []  Theodis Shove, PharmD []  Peggyann Juba, PharmD []  Reuel Boom, PharmD   Positive urine culture Treated with cephalexin, organism sensitive to the same and no further patient follow-up is required at this time.  Hazle Nordmann 12/04/2018, 11:32 AM

## 2018-12-14 DIAGNOSIS — H903 Sensorineural hearing loss, bilateral: Secondary | ICD-10-CM | POA: Diagnosis not present

## 2018-12-14 DIAGNOSIS — H6123 Impacted cerumen, bilateral: Secondary | ICD-10-CM | POA: Diagnosis not present

## 2018-12-20 ENCOUNTER — Other Ambulatory Visit: Payer: Self-pay

## 2018-12-20 ENCOUNTER — Encounter (HOSPITAL_COMMUNITY): Payer: Self-pay | Admitting: Emergency Medicine

## 2018-12-20 ENCOUNTER — Observation Stay (HOSPITAL_COMMUNITY)
Admission: EM | Admit: 2018-12-20 | Discharge: 2018-12-22 | Disposition: A | Discharge status: Home / Self Care | Payer: PPO | Attending: Internal Medicine | Admitting: Internal Medicine

## 2018-12-20 DIAGNOSIS — N390 Urinary tract infection, site not specified: Principal | ICD-10-CM | POA: Diagnosis present

## 2018-12-20 DIAGNOSIS — N3091 Cystitis, unspecified with hematuria: Secondary | ICD-10-CM | POA: Diagnosis present

## 2018-12-20 DIAGNOSIS — E119 Type 2 diabetes mellitus without complications: Secondary | ICD-10-CM

## 2018-12-20 DIAGNOSIS — I482 Chronic atrial fibrillation, unspecified: Secondary | ICD-10-CM | POA: Diagnosis not present

## 2018-12-20 DIAGNOSIS — E785 Hyperlipidemia, unspecified: Secondary | ICD-10-CM | POA: Diagnosis not present

## 2018-12-20 DIAGNOSIS — R0781 Pleurodynia: Secondary | ICD-10-CM | POA: Diagnosis not present

## 2018-12-20 DIAGNOSIS — Z950 Presence of cardiac pacemaker: Secondary | ICD-10-CM | POA: Insufficient documentation

## 2018-12-20 DIAGNOSIS — Z20828 Contact with and (suspected) exposure to other viral communicable diseases: Secondary | ICD-10-CM | POA: Diagnosis not present

## 2018-12-20 DIAGNOSIS — B961 Klebsiella pneumoniae [K. pneumoniae] as the cause of diseases classified elsewhere: Secondary | ICD-10-CM | POA: Insufficient documentation

## 2018-12-20 DIAGNOSIS — N3090 Cystitis, unspecified without hematuria: Secondary | ICD-10-CM | POA: Diagnosis not present

## 2018-12-20 DIAGNOSIS — D649 Anemia, unspecified: Secondary | ICD-10-CM | POA: Diagnosis not present

## 2018-12-20 DIAGNOSIS — I2510 Atherosclerotic heart disease of native coronary artery without angina pectoris: Secondary | ICD-10-CM | POA: Insufficient documentation

## 2018-12-20 DIAGNOSIS — E7800 Pure hypercholesterolemia, unspecified: Secondary | ICD-10-CM | POA: Diagnosis present

## 2018-12-20 DIAGNOSIS — R52 Pain, unspecified: Secondary | ICD-10-CM | POA: Diagnosis not present

## 2018-12-20 DIAGNOSIS — Z885 Allergy status to narcotic agent status: Secondary | ICD-10-CM | POA: Diagnosis not present

## 2018-12-20 DIAGNOSIS — Z1159 Encounter for screening for other viral diseases: Secondary | ICD-10-CM | POA: Diagnosis not present

## 2018-12-20 DIAGNOSIS — M545 Low back pain: Secondary | ICD-10-CM | POA: Diagnosis not present

## 2018-12-20 DIAGNOSIS — Z7901 Long term (current) use of anticoagulants: Secondary | ICD-10-CM | POA: Diagnosis not present

## 2018-12-20 DIAGNOSIS — Z79899 Other long term (current) drug therapy: Secondary | ICD-10-CM | POA: Insufficient documentation

## 2018-12-20 DIAGNOSIS — R0902 Hypoxemia: Secondary | ICD-10-CM | POA: Diagnosis not present

## 2018-12-20 DIAGNOSIS — T300 Burn of unspecified body region, unspecified degree: Secondary | ICD-10-CM | POA: Diagnosis not present

## 2018-12-20 DIAGNOSIS — Z952 Presence of prosthetic heart valve: Secondary | ICD-10-CM | POA: Diagnosis not present

## 2018-12-20 DIAGNOSIS — Z8249 Family history of ischemic heart disease and other diseases of the circulatory system: Secondary | ICD-10-CM | POA: Insufficient documentation

## 2018-12-20 DIAGNOSIS — I119 Hypertensive heart disease without heart failure: Secondary | ICD-10-CM | POA: Diagnosis not present

## 2018-12-20 DIAGNOSIS — I10 Essential (primary) hypertension: Secondary | ICD-10-CM | POA: Diagnosis present

## 2018-12-20 DIAGNOSIS — M5489 Other dorsalgia: Secondary | ICD-10-CM | POA: Diagnosis not present

## 2018-12-20 LAB — URINALYSIS, ROUTINE W REFLEX MICROSCOPIC
Bilirubin Urine: NEGATIVE
Glucose, UA: NEGATIVE mg/dL
Ketones, ur: NEGATIVE mg/dL
Nitrite: POSITIVE — AB
Protein, ur: >=300 mg/dL — AB
RBC / HPF: >50 RBC/hpf — ABNORMAL HIGH (ref 0–5)
Specific Gravity, Urine: 1.011 (ref 1.005–1.030)
WBC, UA: >50 WBC/hpf — ABNORMAL HIGH (ref 0–5)
pH: 6 (ref 5.0–8.0)

## 2018-12-20 NOTE — ED Notes (Signed)
Bladder scan recorded 22 and 42 mL of urine in patient's bladder.

## 2018-12-20 NOTE — ED Notes (Signed)
Bed: RESB Expected date:  Expected time:  Means of arrival:  Comments: EMS 83 yo female from home/bladder pain and back pain-hx UTI-started 5 hours ago-IV attempt unsuccessful-[place]

## 2018-12-20 NOTE — ED Provider Notes (Addendum)
[facility] DEPT Provider Note: Georgena Spurling, MD, FACEP  CSN: [id number] MRN: [id number] ARRIVAL: 12/20/18 at 2247 ROOM: RESB/RESB   CHIEF COMPLAINT  Dysuria   HISTORY OF PRESENT ILLNESS  12/20/18 11:15 PM Courtney Evans is a 83 y.o. female who complains of pain in her bladder since about 5:30 PM this evening.  She rates her pain as an 8 out of 10, worse with urination.  Pain with urination is described as burning.  She also is having pain in her right lower posterior ribs for the past 2 weeks.  She states she has a history of spontaneous rib fractures and thinks she may have another.  She denies fever, chills, nausea, vomiting or diarrhea.   Past Medical History:  Diagnosis Date   A-fib [facility])    Compression fracture    Coronary artery disease    Diabetes mellitus without complication ([place])    Hyperlipidemia    Hypertension    Pacemaker     Past Surgical History:  Procedure Laterality Date   AORTIC AND MITRAL VALVE REPLACEMENT  1994   w/ mechanical valves at [place]     KYPHOPLASTY  2011   1 - at [facility]   KYPHOPLASTY  2012   2 - at [facility]   KYPHOPLASTY  2013   1 - at [facility]   KYPHOPLASTY  05/2015   1 - at [place]  2013   [place]   [place]    Family History  Problem Relation Age of Onset   Heart attack Mother    Stroke Father     Social History   Tobacco Use   Smoking status: Never Smoker   Smokeless tobacco: Never Used  Substance Use Topics   Alcohol use: No   Drug use: No    Prior to Admission medications   Medication Sig Start Date End Date Taking? Authorizing Provider  acetaminophen (TYLENOL) 500 MG tablet Take 500-1,000 mg by mouth every 6 (six) hours as needed for mild pain, moderate pain, fever or  headache.    [provider]  amLODipine (NORVASC) 5 MG tablet Take 5 mg by mouth daily.     [provider]  atorvastatin (LIPITOR) 20 MG tablet Take 20 mg by mouth daily.     [provider]  cephALEXin (KEFLEX) 500 MG capsule Take 1 capsule (500 mg total) by mouth 3 (three) times daily. 12/01/18   Ward, Delice Bison, DO  cholecalciferol (VITAMIN D) 1000 units tablet Take 2,000 Units by mouth daily.     [provider]  DULoxetine (CYMBALTA) 60 MG capsule Take 60 mg by mouth daily. 04/20/18   [provider]  nitroGLYCERIN (NITROSTAT) 0.4 MG SL tablet Place 0.4 mg under the tongue every 5 (five) minutes as needed for chest pain.    [provider]  ondansetron (ZOFRAN ODT) 4 MG disintegrating tablet Take 1 tablet (4 mg total) by mouth every 6 (six) hours as needed for nausea or vomiting. 12/01/18   Ward, Delice Bison, DO  Probiotic CAPS Take 1 capsule by mouth daily. 12/01/18   Ward, Delice Bison, DO  warfarin (COUMADIN) 2.5 MG tablet Take 2.5 mg by mouth daily. 04/09/18   [provider]    Allergies Codeine   REVIEW OF  SYSTEMS  Negative except as noted here or in the History of Present Illness.   PHYSICAL EXAMINATION  Initial Vital Signs Blood pressure (!) 176/67, pulse 75, temperature 98.3 F (36.8 C), temperature source Oral, resp. rate 16, height 5\' 4"  (1.626 m), weight 61.2 kg, SpO2 95 %.  Examination General: Well-developed, well-nourished female in no acute distress; appearance consistent with age of record HENT: normocephalic; atraumatic Eyes: pupils equal, round and reactive to light; extraocular muscles intact; bilateral pseudophakia Neck: supple Heart: regular rate and rhythm Lungs: clear to auscultation bilaterally Chest: Right lower posterior rib tenderness without deformity or crepitus Abdomen: soft; nondistended; nontender; bowel sounds present GU: Urine grossly bloody Extremities: No acute deformity; full range of  motion; pulses normal; trace edema of lower legs Neurologic: Awake, alert and oriented; motor function intact in all extremities and symmetric; no facial droop Skin: Warm and dry Psychiatric: Normal mood and affect   RESULTS  Summary of this visit's results, reviewed by myself:   EKG Interpretation  Date/Time:    Ventricular Rate:    PR Interval:    QRS Duration:   QT Interval:    QTC Calculation:   R Axis:     Text Interpretation:        Laboratory Studies: Results for orders placed or performed during the hospital encounter of 12/20/18 (from the past 24 hour(s))  Urinalysis, Routine w reflex microscopic- may I&O cath if menses     Status: Abnormal   Collection Time: 12/20/18 11:08 PM  Result Value Ref Range   Color, Urine RED (A) YELLOW   APPearance CLOUDY (A) CLEAR   Specific Gravity, Urine 1.011 1.005 - 1.030   pH 6.0 5.0 - 8.0   Glucose, UA NEGATIVE NEGATIVE mg/dL   Hgb urine dipstick LARGE (A) NEGATIVE   Bilirubin Urine NEGATIVE NEGATIVE   Ketones, ur NEGATIVE NEGATIVE mg/dL   Protein, ur >=300 (A) NEGATIVE mg/dL   Nitrite POSITIVE (A) NEGATIVE   Leukocytes,Ua MODERATE (A) NEGATIVE   RBC / HPF >50 (H) 0 - 5 RBC/hpf   WBC, UA >50 (H) 0 - 5 WBC/hpf   Bacteria, UA FEW (A) NONE SEEN   Imaging Studies: Dg Ribs Unilateral W/chest Right  Result Date: 12/21/2018 CLINICAL DATA:  Rib pain EXAM: RIGHT RIBS AND CHEST - 3+ VIEW COMPARISON:  October 19, 2016. FINDINGS: The heart size is enlarged. The patient is status post prior median sternotomy. The patient is status post prior multilevel vertebral augmentation. A left-sided ICD is noted. There is no pneumothorax. The lung volumes are somewhat low. There are likely trace bilateral pleural effusions. There appears to be some generalized volume overload. There are subtle airspace opacities in the upper lung zones bilaterally. There is no displaced rib fracture. IMPRESSION: 1. No displaced rib fracture. 2. Cardiomegaly. Low lung  volumes. Probable mild volume overload with trace bilateral pleural effusions. 3. Hazy bilateral upper lung zone airspace opacities may be secondary to developing pulmonary edema versus a developing infiltrate. Clinical correlation is recommended. Electronically Signed   By: Constance Holster M.D.   On: 12/21/2018 01:01    ED COURSE and MDM  Nursing notes and initial vitals signs, including pulse oximetry, reviewed.  Vitals:   12/20/18 2303 12/20/18 2306 12/21/18 0000 12/21/18 0030  BP: (!) 176/67  (!) 153/79 (!) 178/68  Pulse: 75  76 76  Resp: 16  18 16   Temp: 98.3 F (36.8 C)     TempSrc: Oral     SpO2: 95%  92% 94%  Weight:  61.2 kg    Height:  5\' 4"  (1.626 m)     1:07 AM Patient given 1 g of Rocephin IV for urinary tract infection.  She is not showing signs of sepsis at this time and I believe she is safe for discharge home.  Apart from rib pain she has no clinical evidence of pneumonia such as dyspnea or fever.  Most recent urine culture, June 6 of this year, grew out Klebsiella pneumoniae sensitive to cephalosporins and ciprofloxacin.   2:36 AM Patient now confused and unable to give address or name of [place].  This is likely a combination of sundowning and urinary tract infection.  She lives alone so we will have the patient admitted.  PROCEDURES    ED DIAGNOSES     ICD-10-CM   1. Hemorrhagic cystitis  N30.91   2. Rib pain on right side  R07.81        Shanon Rosser, MD 12/21/18 0110    Shanon Rosser, MD 12/21/18 [id number]

## 2018-12-20 NOTE — ED Triage Notes (Signed)
Arrives from home via EMS. Bladder pain started at 5:30 pm this evening, hx of UTI, reports back pain as well. Patient is able to urinate, reports burning with urination.

## 2018-12-21 ENCOUNTER — Other Ambulatory Visit: Payer: Self-pay

## 2018-12-21 ENCOUNTER — Emergency Department (HOSPITAL_COMMUNITY): Payer: PPO

## 2018-12-21 ENCOUNTER — Encounter (HOSPITAL_COMMUNITY): Payer: Self-pay | Admitting: Internal Medicine

## 2018-12-21 DIAGNOSIS — I10 Essential (primary) hypertension: Secondary | ICD-10-CM

## 2018-12-21 DIAGNOSIS — E7800 Pure hypercholesterolemia, unspecified: Secondary | ICD-10-CM

## 2018-12-21 DIAGNOSIS — I482 Chronic atrial fibrillation, unspecified: Secondary | ICD-10-CM | POA: Diagnosis not present

## 2018-12-21 DIAGNOSIS — R0781 Pleurodynia: Secondary | ICD-10-CM | POA: Diagnosis not present

## 2018-12-21 DIAGNOSIS — N390 Urinary tract infection, site not specified: Secondary | ICD-10-CM

## 2018-12-21 LAB — CBC WITH DIFFERENTIAL/PLATELET
Abs Immature Granulocytes: 0.04 10*3/uL (ref 0.00–0.07)
Basophils Absolute: 0.1 10*3/uL (ref 0.0–0.1)
Basophils Relative: 1 %
Eosinophils Absolute: 0 10*3/uL (ref 0.0–0.5)
Eosinophils Relative: 0 %
HCT: 39 % (ref 36.0–46.0)
Hemoglobin: 13.2 g/dL (ref 12.0–15.0)
Immature Granulocytes: 0 %
Lymphocytes Relative: 12 %
Lymphs Abs: 1.3 10*3/uL (ref 0.7–4.0)
MCH: 33.1 pg (ref 26.0–34.0)
MCHC: 33.8 g/dL (ref 30.0–36.0)
MCV: 97.7 fL (ref 80.0–100.0)
Monocytes Absolute: 0.9 10*3/uL (ref 0.1–1.0)
Monocytes Relative: 9 %
Neutro Abs: 7.9 10*3/uL — ABNORMAL HIGH (ref 1.7–7.7)
Neutrophils Relative %: 78 %
Platelets: 174 10*3/uL (ref 150–400)
RBC: 3.99 MIL/uL (ref 3.87–5.11)
RDW: 12.9 % (ref 11.5–15.5)
WBC: 10.2 10*3/uL (ref 4.0–10.5)
nRBC: 0 % (ref 0.0–0.2)

## 2018-12-21 LAB — BASIC METABOLIC PANEL
Anion gap: 13 (ref 5–15)
BUN: 11 mg/dL (ref 8–23)
CO2: 22 mmol/L (ref 22–32)
Calcium: 8.9 mg/dL (ref 8.9–10.3)
Chloride: 104 mmol/L (ref 98–111)
Creatinine, Ser: 0.71 mg/dL (ref 0.44–1.00)
GFR calc Af Amer: >60 mL/min (ref >60)
GFR calc non Af Amer: >60 mL/min (ref >60)
Glucose, Bld: 136 mg/dL — ABNORMAL HIGH (ref 70–99)
Potassium: 3.7 mmol/L (ref 3.5–5.1)
Sodium: 139 mmol/L (ref 135–145)

## 2018-12-21 LAB — GLUCOSE, CAPILLARY
Glucose-Capillary: 108 mg/dL — ABNORMAL HIGH (ref 70–99)
Glucose-Capillary: 113 mg/dL — ABNORMAL HIGH (ref 70–99)
Glucose-Capillary: 108 mg/dL — ABNORMAL HIGH (ref 70–99)
Glucose-Capillary: 81 mg/dL (ref 70–99)

## 2018-12-21 LAB — SARS CORONAVIRUS 2 BY RT PCR (HOSPITAL ORDER, PERFORMED IN ~~LOC~~ HOSPITAL LAB): SARS Coronavirus 2: NEGATIVE

## 2018-12-21 LAB — HEMOGLOBIN A1C
Hgb A1c MFr Bld: 5.9 % — ABNORMAL HIGH (ref 4.8–5.6)
Mean Plasma Glucose: 122.63 mg/dL

## 2018-12-21 LAB — PROTIME-INR
INR: 2.8 — ABNORMAL HIGH (ref 0.8–1.2)
Prothrombin Time: 29 seconds — ABNORMAL HIGH (ref 11.4–15.2)

## 2018-12-21 MED ORDER — ACETAMINOPHEN 650 MG RE SUPP: 650.0000 mg | Freq: Four times a day (QID) | RECTAL | Status: DC | PRN

## 2018-12-21 MED ORDER — HYDROCORTISONE 1 % EX CREA
1.0000 "application " | TOPICAL_CREAM | Freq: Three times a day (TID) | CUTANEOUS | Status: DC | PRN
  Filled 2018-12-21: qty 28

## 2018-12-21 MED ORDER — PHENAZOPYRIDINE HCL 200 MG PO TABS: 200.0000 mg | ORAL_TABLET | Freq: Three times a day (TID) | ORAL | 0 refills | Status: AC | PRN

## 2018-12-21 MED ORDER — SODIUM CHLORIDE 0.9 % IV SOLN
1.0000 g | INTRAVENOUS | Status: DC
  Administered 2018-12-22: 1 g via INTRAVENOUS
  Filled 2018-12-21: qty 1

## 2018-12-21 MED ORDER — ALUM & MAG HYDROXIDE-SIMETH 200-200-20 MG/5ML PO SUSP: 30.0000 mL | ORAL | Status: DC | PRN

## 2018-12-21 MED ORDER — POLYVINYL ALCOHOL 1.4 % OP SOLN
1.0000 [drp] | OPHTHALMIC | Status: DC | PRN
  Filled 2018-12-21: qty 15

## 2018-12-21 MED ORDER — INSULIN ASPART 100 UNIT/ML ~~LOC~~ SOLN
0.0000 [IU] | Freq: Every day | SUBCUTANEOUS | Status: DC
  Filled 2018-12-21: qty 0.05

## 2018-12-21 MED ORDER — AMLODIPINE BESYLATE 5 MG PO TABS
5.0000 mg | ORAL_TABLET | Freq: Every day | ORAL | Status: DC
  Administered 2018-12-21: 09:00:00 5 mg via ORAL
  Filled 2018-12-21: qty 1

## 2018-12-21 MED ORDER — CEPHALEXIN 500 MG PO CAPS: 500.0000 mg | ORAL_CAPSULE | Freq: Three times a day (TID) | ORAL | 0 refills | Status: DC

## 2018-12-21 MED ORDER — LIP MEDEX EX OINT: 1.0000 "application " | TOPICAL_OINTMENT | CUTANEOUS | Status: DC | PRN

## 2018-12-21 MED ORDER — MAGNESIUM SULFATE 2 GM/50ML IV SOLN
2.0000 g | Freq: Once | INTRAVENOUS | Status: AC
  Administered 2018-12-21: 09:00:00 2 g via INTRAVENOUS
  Filled 2018-12-21: qty 50

## 2018-12-21 MED ORDER — PHENAZOPYRIDINE HCL 200 MG PO TABS
200.0000 mg | ORAL_TABLET | Freq: Three times a day (TID) | ORAL | Status: DC
  Administered 2018-12-21: 01:00:00 200 mg via ORAL
  Filled 2018-12-21 (×2): qty 1

## 2018-12-21 MED ORDER — ATORVASTATIN CALCIUM 20 MG PO TABS
20.0000 mg | ORAL_TABLET | Freq: Every day | ORAL | Status: DC
  Administered 2018-12-21: 20 mg via ORAL
  Filled 2018-12-21: qty 1

## 2018-12-21 MED ORDER — LORATADINE 10 MG PO TABS: 10.0000 mg | ORAL_TABLET | Freq: Every day | ORAL | Status: DC | PRN

## 2018-12-21 MED ORDER — HYDROCORTISONE (PERIANAL) 2.5 % EX CREA
1.0000 "application " | TOPICAL_CREAM | Freq: Four times a day (QID) | CUTANEOUS | Status: DC | PRN
  Filled 2018-12-21: qty 28.35

## 2018-12-21 MED ORDER — WARFARIN SODIUM 2.5 MG PO TABS
2.5000 mg | ORAL_TABLET | Freq: Once | ORAL | Status: AC
  Administered 2018-12-21: 2.5 mg via ORAL
  Filled 2018-12-21: qty 1

## 2018-12-21 MED ORDER — DULOXETINE HCL 60 MG PO CPEP
60.0000 mg | ORAL_CAPSULE | Freq: Every day | ORAL | Status: DC
  Administered 2018-12-21: 60 mg via ORAL
  Filled 2018-12-21: qty 1

## 2018-12-21 MED ORDER — INSULIN ASPART 100 UNIT/ML ~~LOC~~ SOLN
0.0000 [IU] | Freq: Three times a day (TID) | SUBCUTANEOUS | Status: DC
  Administered 2018-12-22: 08:00:00 1 [IU] via SUBCUTANEOUS
  Filled 2018-12-21: qty 0.09

## 2018-12-21 MED ORDER — SODIUM CHLORIDE 0.9 % IV SOLN
1.0000 g | Freq: Once | INTRAVENOUS | Status: AC
  Administered 2018-12-21: 1 g via INTRAVENOUS
  Filled 2018-12-21: qty 10

## 2018-12-21 MED ORDER — HYDRALAZINE HCL 20 MG/ML IJ SOLN: 10.0000 mg | INTRAMUSCULAR | Status: DC | PRN

## 2018-12-21 MED ORDER — WARFARIN - PHARMACIST DOSING INPATIENT: Freq: Every day | Status: DC

## 2018-12-21 MED ORDER — SALINE SPRAY 0.65 % NA SOLN
1.0000 | NASAL | Status: DC | PRN
  Filled 2018-12-21: qty 44

## 2018-12-21 MED ORDER — PHENOL 1.4 % MT LIQD
1.0000 | OROMUCOSAL | Status: DC | PRN
  Filled 2018-12-21: qty 177

## 2018-12-21 MED ORDER — SENNOSIDES-DOCUSATE SODIUM 8.6-50 MG PO TABS: 2.0000 | ORAL_TABLET | Freq: Every evening | ORAL | Status: DC | PRN

## 2018-12-21 MED ORDER — ACETAMINOPHEN 325 MG PO TABS: 650.0000 mg | ORAL_TABLET | Freq: Four times a day (QID) | ORAL | Status: DC | PRN

## 2018-12-21 MED ORDER — MUSCLE RUB 10-15 % EX CREA
1.0000 "application " | TOPICAL_CREAM | CUTANEOUS | Status: DC | PRN
  Filled 2018-12-21: qty 85

## 2018-12-21 MED ORDER — SODIUM CHLORIDE 0.9 % IV SOLN
INTRAVENOUS | Status: DC
  Administered 2018-12-21: 04:00:00 via INTRAVENOUS

## 2018-12-21 NOTE — Progress Notes (Signed)
ANTICOAGULATION CONSULT NOTE - Initial Consult  Pharmacy Consult for Warfarin Indication: a-fib, mechanical valve (mitral, aortic)  Allergies  Allergen Reactions  . Codeine Nausea And Vomiting    Patient Measurements: Height: 5\' 4"  (162.6 cm) Weight: 142 lb 3.2 oz (64.5 kg) IBW/kg (Calculated) : 54.7   Vital Signs: Temp: 98.8 F (37.1 C) (06/26 0358) Temp Source: Oral (06/26 0358) BP: 140/56 (06/26 0834) Pulse Rate: 69 (06/26 0834)  Labs: Recent Labs    12/21/18 0328 12/21/18 0807  HGB 13.2  --   HCT 39.0  --   PLT 174  --   LABPROT  --  29.0*  INR  --  2.8*  CREATININE 0.71  --     Estimated Creatinine Clearance: 48.4 mL/min (by C-G formula based on SCr of 0.71 mg/dL).   Medications:  Scheduled:  . amLODipine  5 mg Oral Daily  . atorvastatin  20 mg Oral q1800  . DULoxetine  60 mg Oral Daily  . insulin aspart  0-5 Units Subcutaneous QHS  . insulin aspart  0-9 Units Subcutaneous TID WC  . warfarin  2.5 mg Oral ONCE-1800  . Warfarin - Pharmacist Dosing Inpatient   Does not apply q1800   Infusions:  . [START ON 12/22/2018] cefTRIAXone (ROCEPHIN)  IV      Assessment: 82 yoF on chronic warfarin for a-fib, mechanical valve admitted for UTI. Pharmacy to dose warfarin while admitted.   Per PCP office, INR goal 2.5-3.5 as of May 2020. During 2018 admission, Dr. Debara Pickett recommended goal 3-3.5.    Baseline INR therapeutic  Prior anticoagulation: warfarin 2.5 mg daily   Significant events:  Today, 12/21/2018:  CBC: WNL  INR therapeutic  Major drug interactions: none  No bleeding issues per nursing  Diet ordered  Goal of Therapy: INR 2.5-3.5  Plan:  Warfarin 2.5 mg PO tonight at 18:00 - without any recent thrombotic events I am not inclined to favor an older, higher goal INR range; additionally I expect INR will rise during this admission d/t acute illness and abx  Daily INR  CBC at least q72 hr while on warfarin  Monitor for signs of bleeding or  thrombosis   Reuel Boom, PharmD, BCPS [phone] 12/21/2018, 12:03 PM

## 2018-12-21 NOTE — Progress Notes (Addendum)
Patient admitted this morning by Dr. Maudie Mercury with diagnosis of confusion secondary to urinary tract infection.  Patient was started on Rocephin. Patient seen and examined at bedside this morning, tolerated she feels little better this morning.  Tolerating her breakfast.  Has some urinary burning as well.  Vital signs are stable, remains afebrile.  On physical exam bibasilar crackles.  Normal sinus rhythm.  Abdomen is nondistended nontender and benign appearing. Her mentation has improved but still ittle cloudy judgement.   At this point will continue treatment for urinary tract infection with IV Rocephin.  Tailor antibiotics accordingly.  Stop IV fluids due to some signs of fluid overload on the x-ray. Overall feels weak due to UTI, she also leaves alone at home. Unsafe for discharge today.  Will need to continue IV Abx as mentioned above today, give her time to get better. Unable to give IVF as she is showing some signs of starting of volume overload.   Call with further questions as needed.  Gerlean Ren MD [facility]

## 2018-12-21 NOTE — Plan of Care (Signed)
Pt progressing

## 2018-12-21 NOTE — H&P (Addendum)
TRH H&P    Patient Demographics:    Courtney Evans, is a 83 y.o. female  MRN: [id number]  DOB - 15-Jul-1935  Admit Date - 12/20/2018  Referring MD/NP/PA: Shanon Rosser  Outpatient Primary MD for the patient is Cari Caraway, MD  Patient coming from:  home  Chief complaint- dysuria   HPI:    Courtney Evans  is a 83 y.o. female, w hypertension, hyperlipidemia, dm2, CAD, Afib, h/o pacer, apparently presents with c/o dysuria,  Pt appears confused, per ED, secondary to acute lower UTI.  Pt is axox3, but doesn't know her address.   In ED,  T 98.3  P 74  R 18  Bp 189/77 pox 92 Wt 61.2kg  Urinalysis wbc >50, rbc >50  Pt will be admitted observation for acute lower UTI     Review of systems:    In addition to the HPI above,  No Fever-chills, No Headache, No changes with Vision or hearing, No problems swallowing food or Liquids, No Chest pain, Cough or Shortness of Breath, No Abdominal pain, No Nausea or Vomiting, bowel movements are regular, No Blood in stool   No new skin rashes or bruises, No new joints pains-aches,  No new weakness, tingling, numbness in any extremity, No recent weight gain or loss, No polyuria, polydypsia or polyphagia, No significant Mental Stressors.  All other systems reviewed and are negative.    Past History of the following :    Past Medical History:  Diagnosis Date  . A-fib ([place])   . Compression fracture   . Coronary artery disease   . Diabetes mellitus without complication ([place])   . Hyperlipidemia   . Hypertension   . Pacemaker       Past Surgical History:  Procedure Laterality Date  . AORTIC AND MITRAL VALVE REPLACEMENT  1994   w/ mechanical valves at [facility]  . BACK SURGERY    . CARDIAC SURGERY    . KYPHOPLASTY  2011   1 - at [facility]  . KYPHOPLASTY  2012   2 - at [facility]  . KYPHOPLASTY  2013   1 - at [facility]  . KYPHOPLASTY  05/2015   1 - at [place]  . PACEMAKER GENERATOR CHANGE  2013  . [place]      Social History:      Social History   Tobacco Use  . Smoking status: Never Smoker  . Smokeless tobacco: Never Used  Substance Use Topics  . Alcohol use: No       Family History :     Family History  Problem Relation Age of Onset  . Heart attack Mother   . Stroke Father       Home Medications:   Prior to Admission medications   Medication Sig Start Date End Date Taking? Authorizing Provider  acetaminophen (TYLENOL) 500 MG tablet Take 500-1,000 mg by mouth  every 6 (six) hours as needed for mild pain, moderate pain, fever or headache.    [provider]  amLODipine (NORVASC) 5 MG tablet Take 5 mg by mouth daily.     [provider]  atorvastatin (LIPITOR) 20 MG tablet Take 20 mg by mouth daily.     [provider]  cephALEXin (KEFLEX) 500 MG capsule Take 1 capsule (500 mg total) by mouth 3 (three) times daily. 12/21/18   Molpus, John, MD  cholecalciferol (VITAMIN D) 1000 units tablet Take 2,000 Units by mouth daily.     [provider]  DULoxetine (CYMBALTA) 60 MG capsule Take 60 mg by mouth daily. 04/20/18   [provider]  nitroGLYCERIN (NITROSTAT) 0.4 MG SL tablet Place 0.4 mg under the tongue every 5 (five) minutes as needed for chest pain.    [provider]  ondansetron (ZOFRAN ODT) 4 MG disintegrating tablet Take 1 tablet (4 mg total) by mouth every 6 (six) hours as needed for nausea or vomiting. 12/01/18   Ward, Delice Bison, DO  phenazopyridine (PYRIDIUM) 200 MG tablet Take 1 tablet (200 mg total) by mouth 3 (three) times daily as needed for pain. 12/21/18   Molpus, John, MD  Probiotic CAPS Take 1 capsule by mouth daily. 12/01/18   Ward, Delice Bison, DO  warfarin (COUMADIN) 2.5 MG tablet Take 2.5 mg by  mouth daily. 04/09/18   [provider]     Allergies:     Allergies  Allergen Reactions  . Codeine Nausea And Vomiting     Physical Exam:   Vitals  Blood pressure (!) 189/77, pulse 74, temperature 98.3 F (36.8 C), temperature source Oral, resp. rate 18, height 5\' 4"  (1.626 m), weight 61.2 kg, SpO2 92 %.  1.  General: axox3  2. Psychiatric: euthymic  3. Neurologic: cn2-12 intact, reflexes 2+ symmetric, diffuse with no clonus, motor 5/5 in all 4 ext  4. HEENMT:  Anicteric, pupils 1.81mm symmetric, direct, consensual, intact Mmm Neck: no jvd  5. Respiratory : CTAB  6. Cardiovascular : Irr, irr, s1, s2, 1/6 sem llsb  7. Gastrointestinal:  Abd: soft, nt, nd, +bs  8. Skin:  Ext: no c/c/e,  No rash  No CVA tenderness  9.Musculoskeletal:  Good ROM  No adenopathy    Data Review:    CBC No results for input(s): WBC, HGB, HCT, PLT, MCV, MCH, MCHC, RDW, LYMPHSABS, MONOABS, EOSABS, BASOSABS, BANDABS in the last 168 hours.  Invalid input(s): NEUTRABS, BANDSABD ------------------------------------------------------------------------------------------------------------------  Results for orders placed or performed during the hospital encounter of 12/20/18 (from the past 48 hour(s))  Urinalysis, Routine w reflex microscopic- may I&O cath if menses     Status: Abnormal   Collection Time: 12/20/18 11:08 PM  Result Value Ref Range   Color, Urine RED (A) YELLOW    Comment: BIOCHEMICALS MAY BE AFFECTED BY COLOR   APPearance CLOUDY (A) CLEAR   Specific Gravity, Urine 1.011 1.005 - 1.030   pH 6.0 5.0 - 8.0   Glucose, UA NEGATIVE NEGATIVE mg/dL   Hgb urine dipstick LARGE (A) NEGATIVE   Bilirubin Urine NEGATIVE NEGATIVE   Ketones, ur NEGATIVE NEGATIVE mg/dL   Protein, ur >=300 (A) NEGATIVE mg/dL   Nitrite POSITIVE (A) NEGATIVE   Leukocytes,Ua MODERATE (A) NEGATIVE   RBC / HPF >50 (H) 0 - 5 RBC/hpf   WBC, UA >50 (H) 0 - 5 WBC/hpf   Bacteria, UA FEW (A)  NONE SEEN    Comment: Performed at [facility], [place] [address], [place], [place] [address]    Chemistries  No results for input(s): NA, K, CL, CO2, GLUCOSE, BUN, CREATININE, CALCIUM, MG, AST, ALT, ALKPHOS, BILITOT in the last 168 hours.  Invalid input(s): GFRCGP ------------------------------------------------------------------------------------------------------------------  ------------------------------------------------------------------------------------------------------------------ GFR: Estimated Creatinine Clearance: 48.4 mL/min (by C-G formula based on SCr of 0.8 mg/dL). Liver Function Tests: No results for input(s): AST, ALT, ALKPHOS, BILITOT, PROT, ALBUMIN in the last 168 hours. No results for input(s): LIPASE, AMYLASE in the last 168 hours. No results for input(s): AMMONIA in the last 168 hours. Coagulation Profile: No results for input(s): INR, PROTIME in the last 168 hours. Cardiac Enzymes: No results for input(s): CKTOTAL, CKMB, CKMBINDEX, TROPONINI in the last 168 hours. BNP (last 3 results) No results for input(s): PROBNP in the last 8760 hours. HbA1C: No results for input(s): HGBA1C in the last 72 hours. CBG: No results for input(s): GLUCAP in the last 168 hours. Lipid Profile: No results for input(s): CHOL, HDL, LDLCALC, TRIG, CHOLHDL, LDLDIRECT in the last 72 hours. Thyroid Function Tests: No results for input(s): TSH, T4TOTAL, FREET4, T3FREE, THYROIDAB in the last 72 hours. Anemia Panel: No results for input(s): VITAMINB12, FOLATE, FERRITIN, TIBC, IRON, RETICCTPCT in the last 72 hours.  --------------------------------------------------------------------------------------------------------------- Urine analysis:    Component Value Date/Time   COLORURINE RED (A) 12/20/2018 2308   APPEARANCEUR CLOUDY (A) 12/20/2018 2308   LABSPEC 1.011 12/20/2018 2308   PHURINE 6.0 12/20/2018 2308   GLUCOSEU NEGATIVE 12/20/2018 2308   HGBUR LARGE (A)  12/20/2018 2308   BILIRUBINUR NEGATIVE 12/20/2018 2308   KETONESUR NEGATIVE 12/20/2018 2308   PROTEINUR >=300 (A) 12/20/2018 2308   NITRITE POSITIVE (A) 12/20/2018 2308   LEUKOCYTESUR MODERATE (A) 12/20/2018 2308      Imaging Results:    Dg Ribs Unilateral W/chest Right  Result Date: 12/21/2018 CLINICAL DATA:  Rib pain EXAM: RIGHT RIBS AND CHEST - 3+ VIEW COMPARISON:  October 19, 2016. FINDINGS: The heart size is enlarged. The patient is status post prior median sternotomy. The patient is status post prior multilevel vertebral augmentation. A left-sided ICD is noted. There is no pneumothorax. The lung volumes are somewhat low. There are likely trace bilateral pleural effusions. There appears to be some generalized volume overload. There are subtle airspace opacities in the upper lung zones bilaterally. There is no displaced rib fracture. IMPRESSION: 1. No displaced rib fracture. 2. Cardiomegaly. Low lung volumes. Probable mild volume overload with trace bilateral pleural effusions. 3. Hazy bilateral upper lung zone airspace opacities may be secondary to developing pulmonary edema versus a developing infiltrate. Clinical correlation is recommended. Electronically Signed   By: Constance Holster M.D.   On: 12/21/2018 01:01    Assessment & Plan:    Principal Problem:   Acute lower UTI Active Problems:   Chronic atrial fibrillation   Anemia   Type 2 diabetes mellitus without complication (HCC)   Hypercholesterolemia   Essential hypertension  Acute lower uti Urine culture Blood culture x2 Rocephin 1gm iv qday  Chronic Afib/ h/o mechanical mitral valve Coumadin pharmacy to dose  Hypertension Cont Amlodipine 5mg  po qday  Hyperlipidemia Cont Lipitor 20mg  po qhs  Dm2 in past medical history, but not on medication Check hga1c fsbs ac and qhs, ISS      DVT Prophylaxis-   Coumadin  AM Labs Ordered, also please review Full Orders  Family Communication: Admission, patients  condition and plan of care including tests being ordered have been discussed with the patient  who indicate understanding and  agree with the plan and Code Status.  Code Status: FULL CODE  Admission status: Observation/: Based on patients clinical presentation and evaluation of above clinical data, I have made determination that patient meets observation criteria at this time.  Time spent in minutes : 70   Jani Gravel M.D on 12/21/2018 at 2:48 AM

## 2018-12-21 NOTE — Progress Notes (Signed)
ANTICOAGULATION CONSULT NOTE - Initial Consult  Pharmacy Consult for Warfarin Indication: a-fib, mechanical valve (mitral, aortic)  Allergies  Allergen Reactions  . Codeine Nausea And Vomiting    Patient Measurements: Height: 5\' 4"  (162.6 cm) Weight: 135 lb (61.2 kg) IBW/kg (Calculated) : 54.7   Vital Signs: Temp: 98.3 F (36.8 C) (06/25 2303) Temp Source: Oral (06/25 2303) BP: 170/75 (06/26 0345) Pulse Rate: 67 (06/26 0345)  Labs: Recent Labs    12/21/18 0328  HGB 13.2  HCT 39.0  PLT 174    Estimated Creatinine Clearance: 48.4 mL/min (by C-G formula based on SCr of 0.8 mg/dL).   Medical History: Past Medical History:  Diagnosis Date  . A-fib ([place])   . Compression fracture   . Coronary artery disease   . Diabetes mellitus without complication ([place])   . Hyperlipidemia   . Hypertension   . Pacemaker     Medications:  Scheduled:  . insulin aspart  0-5 Units Subcutaneous QHS  . insulin aspart  0-9 Units Subcutaneous TID WC  . phenazopyridine  200 mg Oral TID WC   Infusions:  . sodium chloride    . [START ON 12/22/2018] cefTRIAXone (ROCEPHIN)  IV      Assessment: 40 yoF with dysuria on chronic warfarin for a-fib, mechanical valve. HD and INR pending.  Goal of Therapy:  INR=3-3.5    Plan:  Daily PT/INR F/u HD warfarin  Lawana Pai R 12/21/2018,3:55 AM

## 2018-12-21 NOTE — ED Notes (Signed)
Report given to [employer], Therapist, sports.

## 2018-12-21 NOTE — ED Notes (Signed)
ED TO INPATIENT HANDOFF REPORT  ED Nurse Name and Phone #: [place] G, [phone]  S Name/Age/Gender Courtney Evans 83 y.o. female Room/Bed: RESB/RESB  Code Status   Code Status: Full Code  Home/SNF/Other Home Patient oriented to: self, place and situation Is this baseline? No   Triage Complete: Triage complete  Chief Complaint Bladder pain  Triage Note Arrives from home via EMS. Bladder pain started at 5:30 pm this evening, hx of UTI, reports back pain as well. Patient is able to urinate, reports burning with urination.    Allergies Allergies  Allergen Reactions  . Codeine Nausea And Vomiting    Level of Care/Admitting Diagnosis ED Disposition    ED Disposition Condition Comment   Admit  Hospital Area: [place] [[id number]]  Level of Care: Med-Surg [16]  Covid Evaluation: Screening Protocol (No Symptoms)  Diagnosis: Acute lower UTI [633354]  Admitting Physician: Jani Gravel [[id number]]  Attending Physician: Jani Gravel [[id number]]  PT Class (Do Not Modify): Observation [104]  PT Acc Code (Do Not Modify): Observation [10022]       B Medical/Surgery History Past Medical History:  Diagnosis Date  . A-fib ([place])   . Compression fracture   . Coronary artery disease   . Diabetes mellitus without complication ([place])   . Hyperlipidemia   . Hypertension   . Pacemaker    Past Surgical History:  Procedure Laterality Date  . AORTIC AND MITRAL VALVE REPLACEMENT  1994   w/ mechanical valves at [facility]  . BACK SURGERY    . CARDIAC SURGERY    . KYPHOPLASTY  2011   1 - at [facility]  . KYPHOPLASTY  2012   2 - at [facility]  . KYPHOPLASTY  2013   1 - at [facility]  . KYPHOPLASTY  05/2015   1 - at [place]  . PACEMAKER GENERATOR CHANGE  2013  . [place]     A IV Location/Drains/Wounds Patient  Lines/Drains/Airways Status   Active Line/Drains/Airways    Name:   Placement date:   Placement time:   Site:   Days:   Peripheral IV 12/21/18 Left Antecubital   12/21/18    0026    Antecubital   less than 1          Intake/Output Last 24 hours  Intake/Output Summary (Last 24 hours) at 12/21/2018 0335 Last data filed at 12/21/2018 0057 Gross per 24 hour  Intake 97.35 ml  Output -  Net 97.35 ml    Labs/Imaging Results for orders placed or performed during the hospital encounter of 12/20/18 (from the past 48 hour(s))  Urinalysis, Routine w reflex microscopic- may I&O cath if menses     Status: Abnormal   Collection Time: 12/20/18 11:08 PM  Result Value Ref Range   Color, Urine RED (A) YELLOW    Comment: BIOCHEMICALS MAY BE AFFECTED BY COLOR   APPearance CLOUDY (A) CLEAR   Specific Gravity, Urine 1.011 1.005 - 1.030   pH 6.0 5.0 - 8.0   Glucose, UA NEGATIVE NEGATIVE mg/dL   Hgb urine dipstick LARGE (A) NEGATIVE   Bilirubin Urine NEGATIVE NEGATIVE   Ketones, ur NEGATIVE NEGATIVE mg/dL   Protein, ur >=300 (A) NEGATIVE mg/dL   Nitrite POSITIVE (A) NEGATIVE   Leukocytes,Ua MODERATE (A) NEGATIVE   RBC / HPF >50 (H) 0 - 5 RBC/hpf  WBC, UA >50 (H) 0 - 5 WBC/hpf   Bacteria, UA FEW (A) NONE SEEN    Comment: Performed at [facility], [place] [address] [place], [place] [address]   Dg Ribs Unilateral W/chest Right  Result Date: 12/21/2018 CLINICAL DATA:  Rib pain EXAM: RIGHT RIBS AND CHEST - 3+ VIEW COMPARISON:  October 19, 2016. FINDINGS: The heart size is enlarged. The patient is status post prior median sternotomy. The patient is status post prior multilevel vertebral augmentation. A left-sided ICD is noted. There is no pneumothorax. The lung volumes are somewhat low. There are likely trace bilateral pleural effusions. There appears to be some generalized volume overload. There are subtle airspace opacities in the upper lung zones bilaterally. There is no displaced rib  fracture. IMPRESSION: 1. No displaced rib fracture. 2. Cardiomegaly. Low lung volumes. Probable mild volume overload with trace bilateral pleural effusions. 3. Hazy bilateral upper lung zone airspace opacities may be secondary to developing pulmonary edema versus a developing infiltrate. Clinical correlation is recommended. Electronically Signed   By: Constance Holster M.D.   On: 12/21/2018 01:01    Pending Labs Unresulted Labs (From admission, onward)    Start     Ordered   12/22/18 0500  Comprehensive metabolic panel  Tomorrow morning,   R     12/21/18 0246   12/22/18 0500  CBC  Tomorrow morning,   R     12/21/18 0246   12/21/18 0301  Hemoglobin A1c  Add-on,   AD     12/21/18 0300   12/21/18 0238  SARS Coronavirus 2 (CEPHEID - Performed in [facility] hospital lab), Hosp Order  (Asymptomatic Patients Labs)  Once,   STAT    Question:  Rule Out  Answer:  Yes   12/21/18 0238   12/21/18 0236  CBC with Differential/Platelet  Once,   STAT     12/21/18 0236   12/21/18 9476  Basic metabolic panel  ONCE - STAT,   STAT     12/21/18 0236   12/20/18 2257  Urine C&S  Once,   STAT     12/20/18 2256          Vitals/Pain Today's Vitals   12/21/18 0000 12/21/18 0030 12/21/18 0100 12/21/18 0125  BP: (!) 153/79 (!) 178/68 (!) 189/77   Pulse: 76 76 74   Resp: 18 16 18    Temp:      TempSrc:      SpO2: 92% 94% 92%   Weight:      Height:      PainSc:    3     Isolation Precautions No active isolations  Medications Medications  phenazopyridine (PYRIDIUM) tablet 200 mg (200 mg Oral Given 12/21/18 0039)  0.9 %  sodium chloride infusion (has no administration in time range)  acetaminophen (TYLENOL) tablet 650 mg (has no administration in time range)    Or  acetaminophen (TYLENOL) suppository 650 mg (has no administration in time range)  insulin aspart (novoLOG) injection 0-9 Units (has no administration in time range)  insulin aspart (novoLOG) injection 0-5 Units (has no administration in  time range)  cefTRIAXone (ROCEPHIN) 1 g in sodium chloride 0.9 % 100 mL IVPB (has no administration in time range)  cefTRIAXone (ROCEPHIN) 1 g in sodium chloride 0.9 % 100 mL IVPB (0 g Intravenous Stopped 12/21/18 0057)    Mobility walks Low fall risk

## 2018-12-22 DIAGNOSIS — N390 Urinary tract infection, site not specified: Secondary | ICD-10-CM | POA: Diagnosis not present

## 2018-12-22 LAB — COMPREHENSIVE METABOLIC PANEL
ALT: 13 U/L (ref 0–44)
AST: 25 U/L (ref 15–41)
Albumin: 4 g/dL (ref 3.5–5.0)
Alkaline Phosphatase: 75 U/L (ref 38–126)
Anion gap: 19 — ABNORMAL HIGH (ref 5–15)
BUN: 10 mg/dL (ref 8–23)
CO2: 16 mmol/L — ABNORMAL LOW (ref 22–32)
Calcium: 8.9 mg/dL (ref 8.9–10.3)
Chloride: 112 mmol/L — ABNORMAL HIGH (ref 98–111)
Creatinine, Ser: 0.59 mg/dL (ref 0.44–1.00)
GFR calc Af Amer: >60 mL/min (ref >60)
GFR calc non Af Amer: >60 mL/min (ref >60)
Glucose, Bld: 118 mg/dL — ABNORMAL HIGH (ref 70–99)
Potassium: 3.9 mmol/L (ref 3.5–5.1)
Sodium: 147 mmol/L — ABNORMAL HIGH (ref 135–145)
Total Bilirubin: 0.8 mg/dL (ref 0.3–1.2)
Total Protein: 6.9 g/dL (ref 6.5–8.1)

## 2018-12-22 LAB — CBC
HCT: 41.7 % (ref 36.0–46.0)
Hemoglobin: 13.4 g/dL (ref 12.0–15.0)
MCH: 31.7 pg (ref 26.0–34.0)
MCHC: 32.1 g/dL (ref 30.0–36.0)
MCV: 98.6 fL (ref 80.0–100.0)
Platelets: 187 10*3/uL (ref 150–400)
RBC: 4.23 MIL/uL (ref 3.87–5.11)
RDW: 13.1 % (ref 11.5–15.5)
WBC: 8.9 10*3/uL (ref 4.0–10.5)
nRBC: 0 % (ref 0.0–0.2)

## 2018-12-22 LAB — URINE CULTURE: Culture: >=100000 — AB

## 2018-12-22 LAB — MAGNESIUM: Magnesium: 2.2 mg/dL (ref 1.7–2.4)

## 2018-12-22 LAB — PROTIME-INR
INR: 2.5 — ABNORMAL HIGH (ref 0.8–1.2)
Prothrombin Time: 26.4 seconds — ABNORMAL HIGH (ref 11.4–15.2)

## 2018-12-22 LAB — GLUCOSE, CAPILLARY: Glucose-Capillary: 123 mg/dL — ABNORMAL HIGH (ref 70–99)

## 2018-12-22 MED ORDER — CEFPODOXIME PROXETIL 200 MG PO TABS: 200.0000 mg | ORAL_TABLET | Freq: Two times a day (BID) | ORAL | 0 refills | Status: AC

## 2018-12-22 NOTE — Discharge Summary (Signed)
Physician Discharge Summary  Courtney Evans EVO:[id number] DOB: February 21, 1936 DOA: 12/20/2018  PCP: Cari Caraway, MD  Admit date: 12/20/2018 Discharge date: 12/22/2018  Admitted From: Home Disposition: Home  Recommendations for Outpatient Follow-up:  1. Follow up with PCP in 1-2 weeks 2. Please obtain BMP/CBC in one week your next doctors visit.  3. Oral Vantin twice a day for 13 days   Discharge Condition: Stable CODE STATUS: Full code Diet recommendation: Diabetic  Brief/Interim Summary: 83 year old female with history of hypertension, hyperlipidemia, chronic atrial fibrillation, diabetes mellitus type 2, essential hypertension came to the hospital after feeling weakness with some dysuria.  She was diagnosed with urinary tract infection therefore started on Rocephin.  Her culture grew Klebsiella which was sensitive to ceftriaxone, she was transitioned from IV Rocephin to oral Vantin to be taken for 13 more days.  She was given more days due to concerns for recurrent infections with incomplete clearance.  She is also been advised to hydrate herself. I also spoke with the patient daughter over the phone on the day of discharge.   Discharge Diagnoses:  Principal Problem:   Acute lower UTI Active Problems:   Chronic atrial fibrillation   Anemia   Type 2 diabetes mellitus without complication (HCC)   Hypercholesterolemia   Essential hypertension  Acute lower urinary tract infection, Klebsiella - Culture data showed Klebsiella sensitive to IV Rocephin, will transition IV Rocephin to oral Vantin for 14 more days.  Longer course of antibiotics due to recurrent infection secondary to incomplete clearance previously.  History of chronic atrial fibrillation with history of mechanical mitral valve -Resume home Coumadin.  INR on the day of discharge is 2.5  Essential hypertension -Continue amlodipine  Hyperlipidemia -Continue Lipitor  Diabetes mellitus type 2 -Diet  controlled  Consultations:  None  Subjective: Patient states she feels back to herself and does not have any complaints.  Remains afebrile.  She wishes to go home.  I also spoke with the patient's daughter over the phone on the day of discharge.  Discharge Exam: Vitals:   12/21/18 2207 12/22/18 0407  BP: (!) 156/62 (!) 187/75  Pulse: 64 88  Resp: 16 18  Temp: 98.7 F (37.1 C) 98.5 F (36.9 C)  SpO2: 93% 98%   Vitals:   12/21/18 0834 12/21/18 1332 12/21/18 2207 12/22/18 0407  BP: (!) 140/56 (!) 157/63 (!) 156/62 (!) 187/75  Pulse: 69 63 64 88  Resp:  14 16 18   Temp:  98 F (36.7 C) 98.7 F (37.1 C) 98.5 F (36.9 C)  TempSrc:   Oral Oral  SpO2:  92% 93% 98%  Weight:      Height:        General: Pt is alert, awake, not in acute distress Cardiovascular: RRR, S1/S2 +, no rubs, no gallops Respiratory: CTA bilaterally, no wheezing, no rhonchi Abdominal: Soft, NT, ND, bowel sounds + Extremities: no edema, no cyanosis  Discharge Instructions  Discharge Instructions    Call MD for:  temperature >100.4   Complete by: As directed    Diet - low sodium heart healthy   Complete by: As directed    Increase activity slowly   Complete by: As directed      Allergies as of 12/22/2018      Reactions   Codeine Nausea And Vomiting      Medication List    STOP taking these medications   cephALEXin 500 MG capsule Commonly known as: [place] these medications   acetaminophen  650 MG CR tablet Commonly known as: TYLENOL Take 650 mg by mouth every 8 (eight) hours as needed for pain.   amLODipine 5 MG tablet Commonly known as: NORVASC Take 7.5 mg by mouth daily.   atorvastatin 20 MG tablet Commonly known as: LIPITOR Take 20 mg by mouth daily.   cefpodoxime 200 MG tablet Commonly known as: VANTIN Take 1 tablet (200 mg total) by mouth 2 (two) times daily for 13 days.   cholecalciferol 1000 units tablet Commonly known as: VITAMIN D Take 2,000 Units by mouth  daily.   DULoxetine 60 MG capsule Commonly known as: CYMBALTA Take 60 mg by mouth daily.   lisinopril 5 MG tablet Commonly known as: ZESTRIL Take 5 mg by mouth daily.   nitroGLYCERIN 0.4 MG SL tablet Commonly known as: NITROSTAT Place 0.4 mg under the tongue every 5 (five) minutes as needed for chest pain.   ondansetron 4 MG disintegrating tablet Commonly known as: Zofran ODT Take 1 tablet (4 mg total) by mouth every 6 (six) hours as needed for nausea or vomiting.   phenazopyridine 200 MG tablet Commonly known as: Pyridium Take 1 tablet (200 mg total) by mouth 3 (three) times daily as needed for pain.   Probiotic Caps Take 1 capsule by mouth daily.   traMADol 50 MG tablet Commonly known as: ULTRAM Take 50 mg by mouth every 6 (six) hours as needed for moderate pain.   warfarin 2.5 MG tablet Commonly known as: COUMADIN Take 2.5 mg by mouth daily.      Follow-up Information    Cari Caraway, MD. Schedule an appointment as soon as possible for a visit in 2 day(s).   Specialty: Family Medicine Contact information: [place] [address] [phone]          Allergies  Allergen Reactions  . Codeine Nausea And Vomiting    You were cared for by a hospitalist during your hospital stay. If you have any questions about your discharge medications or the care you received while you were in the hospital after you are discharged, you can call the unit and asked to speak with the hospitalist on call if the hospitalist that took care of you is not available. Once you are discharged, your primary care physician will handle any further medical issues. Please note that no refills for any discharge medications will be authorized once you are discharged, as it is imperative that you return to your primary care physician (or establish a relationship with a primary care physician if you do not have one) for your aftercare needs so that they can reassess your need for  medications and monitor your lab values.   Procedures/Studies: Dg Ribs Unilateral W/chest Right  Result Date: 12/21/2018 CLINICAL DATA:  Rib pain EXAM: RIGHT RIBS AND CHEST - 3+ VIEW COMPARISON:  October 19, 2016. FINDINGS: The heart size is enlarged. The patient is status post prior median sternotomy. The patient is status post prior multilevel vertebral augmentation. A left-sided ICD is noted. There is no pneumothorax. The lung volumes are somewhat low. There are likely trace bilateral pleural effusions. There appears to be some generalized volume overload. There are subtle airspace opacities in the upper lung zones bilaterally. There is no displaced rib fracture. IMPRESSION: 1. No displaced rib fracture. 2. Cardiomegaly. Low lung volumes. Probable mild volume overload with trace bilateral pleural effusions. 3. Hazy bilateral upper lung zone airspace opacities may be secondary to developing pulmonary edema versus a developing infiltrate. Clinical correlation  is recommended. Electronically Signed   By: Constance Holster M.D.   On: 12/21/2018 01:01   Ct Abdomen Pelvis W Contrast  Result Date: 12/01/2018 CLINICAL DATA:  Dysuria and lower abdominal pain. EXAM: CT ABDOMEN AND PELVIS WITH CONTRAST TECHNIQUE: Multidetector CT imaging of the abdomen and pelvis was performed using the standard protocol following bolus administration of intravenous contrast. CONTRAST:  172mL OMNIPAQUE IOHEXOL 300 MG/ML SOLN, 59mL OMNIPAQUE IOHEXOL 300 MG/ML SOLN COMPARISON:  05/16/2018 CT abdomen pelvis and CT lumbar spine 09/17/2016 FINDINGS: LOWER CHEST: Moderate cardiomegaly HEPATOBILIARY: The hepatic contours and density are normal. There is no intra- or extrahepatic biliary dilatation. The gallbladder is normal. PANCREAS: The pancreatic parenchymal contours are normal and there is no ductal dilatation. There is no peripancreatic fluid collection. SPLEEN: Normal. ADRENALS/URINARY TRACT: --Adrenal glands: Normal. --Right  kidney/ureter: Mild pelviectasis and hydroureter. No nephroureterolithiasis, perinephric stranding or solid renal mass. --Left kidney/ureter: No hydronephrosis, nephroureterolithiasis, perinephric stranding or solid renal mass. --Urinary bladder: Normal for degree of distention STOMACH/BOWEL: --Stomach/Duodenum: There is no hiatal hernia or other gastric abnormality. The duodenal course and caliber are normal. --Small bowel: No dilatation or inflammation. --Colon: No focal abnormality. --Appendix: Normal. VASCULAR/LYMPHATIC: Normal course and caliber of the major abdominal vessels. No abdominal or pelvic lymphadenopathy. REPRODUCTIVE: There is a vaginal pessary which exerts mass effect on the posterior aspect of the urinary bladder. MUSCULOSKELETAL. Multilevel vertebral augmentation with unchanged height loss. Chronic compression deformity of L3 is unchanged too. OTHER: None. IMPRESSION: 1. Rotated appearance of vaginal pessary with mass effect on the posterior urinary bladder. There is mild right kidney pelviectasis and hydroureter. It is possible that the rotated pessary could impinge on the right ureterovesical junction. 2. Cardiomegaly and aortic atherosclerosis (ICD10-I70.0). 3. Multilevel chronic vertebral body height loss with prior augmentation. Electronically Signed   By: Ulyses Jarred M.D.   On: 12/01/2018 05:07      The results of significant diagnostics from this hospitalization (including imaging, microbiology, ancillary and laboratory) are listed below for reference.     Microbiology: Recent Results (from the past 240 hour(s))  Urine C&S     Status: Abnormal   Collection Time: 12/20/18 11:08 PM   Specimen: Urine, Clean Catch  Result Value Ref Range Status   Specimen Description   Final    URINE, CLEAN CATCH Performed at [facility], [place] [address] [place], [place] [address]    Special Requests   Final    NONE Performed at [facility], [place]  [address], [place], [place] [address]    Culture >=100,000 COLONIES/mL KLEBSIELLA PNEUMONIAE (A)  Final   Report Status 12/22/2018 FINAL  Final   Organism ID, Bacteria KLEBSIELLA PNEUMONIAE (A)  Final      Susceptibility   Klebsiella pneumoniae - MIC*    AMPICILLIN >=32 RESISTANT Resistant     CEFAZOLIN <=4 SENSITIVE Sensitive     CEFTRIAXONE <=1 SENSITIVE Sensitive     CIPROFLOXACIN 1 SENSITIVE Sensitive     GENTAMICIN <=1 SENSITIVE Sensitive     IMIPENEM <=0.25 SENSITIVE Sensitive     NITROFURANTOIN >=512 RESISTANT Resistant     TRIMETH/SULFA <=20 SENSITIVE Sensitive     AMPICILLIN/SULBACTAM 16 INTERMEDIATE Intermediate     PIP/TAZO 8 SENSITIVE Sensitive     Extended ESBL NEGATIVE Sensitive     * >=100,000 COLONIES/mL KLEBSIELLA PNEUMONIAE  SARS Coronavirus 2 (CEPHEID - Performed in [place] hospital lab), Hosp Order     Status: None   Collection Time: 12/21/18  3:28  AM   Specimen: Nasopharyngeal Swab  Result Value Ref Range Status   SARS Coronavirus 2 NEGATIVE NEGATIVE Final    Comment: (NOTE) If result is NEGATIVE SARS-CoV-2 target nucleic acids are NOT DETECTED. The SARS-CoV-2 RNA is generally detectable in upper and lower  respiratory specimens during the acute phase of infection. The lowest  concentration of SARS-CoV-2 viral copies this assay can detect is 250  copies / mL. A negative result does not preclude SARS-CoV-2 infection  and should not be used as the sole basis for treatment or other  patient management decisions.  A negative result may occur with  improper specimen collection / handling, submission of specimen other  than nasopharyngeal swab, presence of viral mutation(s) within the  areas targeted by this assay, and inadequate number of viral copies  (<250 copies / mL). A negative result must be combined with clinical  observations, patient history, and epidemiological information. If result is POSITIVE SARS-CoV-2 target nucleic acids are DETECTED. The  SARS-CoV-2 RNA is generally detectable in upper and lower  respiratory specimens dur ing the acute phase of infection.  Positive  results are indicative of active infection with SARS-CoV-2.  Clinical  correlation with patient history and other diagnostic information is  necessary to determine patient infection status.  Positive results do  not rule out bacterial infection or co-infection with other viruses. If result is PRESUMPTIVE POSTIVE SARS-CoV-2 nucleic acids MAY BE PRESENT.   A presumptive positive result was obtained on the submitted specimen  and confirmed on repeat testing.  While 2019 novel coronavirus  (SARS-CoV-2) nucleic acids may be present in the submitted sample  additional confirmatory testing may be necessary for epidemiological  and / or clinical management purposes  to differentiate between  SARS-CoV-2 and other Sarbecovirus currently known to infect humans.  If clinically indicated additional testing with an alternate test  methodology [phone]) is advised. The SARS-CoV-2 RNA is generally  detectable in upper and lower respiratory sp ecimens during the acute  phase of infection. The expected result is Negative. Fact Sheet for Patients:  StrictlyIdeas.no Fact Sheet for Healthcare Providers: BankingDealers.co.za This test is not yet approved or cleared by the [place] FDA and has been authorized for detection and/or diagnosis of SARS-CoV-2 by FDA under an Emergency Use Authorization (EUA).  This EUA will remain in effect (meaning this test can be used) for the duration of the COVID-19 declaration under Section 564(b)(1) of the Act, 21 U.S.C. section 360bbb-3(b)(1), unless the authorization is terminated or revoked sooner. Performed at [facility], [place] [address] [place], [place] [address]      Labs: BNP (last 3 results) No results for input(s): BNP in the last 8760 hours. Basic Metabolic  Panel: Recent Labs  Lab 12/21/18 0328 12/22/18 0253  NA 139 147*  K 3.7 3.9  CL 104 112*  CO2 22 16*  GLUCOSE 136* 118*  BUN 11 10  CREATININE 0.71 0.59  CALCIUM 8.9 8.9  MG  --  2.2   Liver Function Tests: Recent Labs  Lab 12/22/18 0253  AST 25  ALT 13  ALKPHOS 75  BILITOT 0.8  PROT 6.9  ALBUMIN 4.0   No results for input(s): LIPASE, AMYLASE in the last 168 hours. No results for input(s): AMMONIA in the last 168 hours. CBC: Recent Labs  Lab 12/21/18 0328 12/22/18 0253  WBC 10.2 8.9  NEUTROABS 7.9*  --   HGB 13.2 13.4  HCT 39.0 41.7  MCV 97.7 98.6  PLT 174  187   Cardiac Enzymes: No results for input(s): CKTOTAL, CKMB, CKMBINDEX, TROPONINI in the last 168 hours. BNP: Invalid input(s): POCBNP CBG: Recent Labs  Lab 12/21/18 0713 12/21/18 1135 12/21/18 1616 12/21/18 2209 12/22/18 0728  GLUCAP 108* 113* 108* 81 123*   D-Dimer No results for input(s): DDIMER in the last 72 hours. Hgb A1c Recent Labs    12/21/18 0328  HGBA1C 5.9*   Lipid Profile No results for input(s): CHOL, HDL, LDLCALC, TRIG, CHOLHDL, LDLDIRECT in the last 72 hours. Thyroid function studies No results for input(s): TSH, T4TOTAL, T3FREE, THYROIDAB in the last 72 hours.  Invalid input(s): FREET3 Anemia work up No results for input(s): VITAMINB12, FOLATE, FERRITIN, TIBC, IRON, RETICCTPCT in the last 72 hours. Urinalysis    Component Value Date/Time   COLORURINE RED (A) 12/20/2018 2308   APPEARANCEUR CLOUDY (A) 12/20/2018 2308   LABSPEC 1.011 12/20/2018 2308   PHURINE 6.0 12/20/2018 2308   GLUCOSEU NEGATIVE 12/20/2018 2308   HGBUR LARGE (A) 12/20/2018 2308   BILIRUBINUR NEGATIVE 12/20/2018 2308   KETONESUR NEGATIVE 12/20/2018 2308   PROTEINUR >=300 (A) 12/20/2018 2308   NITRITE POSITIVE (A) 12/20/2018 2308   LEUKOCYTESUR MODERATE (A) 12/20/2018 2308   Sepsis Labs Invalid input(s): PROCALCITONIN,  WBC,  LACTICIDVEN Microbiology Recent Results (from the past 240 hour(s))   Urine C&S     Status: Abnormal   Collection Time: 12/20/18 11:08 PM   Specimen: Urine, Clean Catch  Result Value Ref Range Status   Specimen Description   Final    URINE, CLEAN CATCH Performed at [facility], [place] [address] [place], [place] [address]    Special Requests   Final    NONE Performed at [facility], [place] [address] [place], [place] [address]    Culture >=100,000 COLONIES/mL KLEBSIELLA PNEUMONIAE (A)  Final   Report Status 12/22/2018 FINAL  Final   Organism ID, Bacteria KLEBSIELLA PNEUMONIAE (A)  Final      Susceptibility   Klebsiella pneumoniae - MIC*    AMPICILLIN >=32 RESISTANT Resistant     CEFAZOLIN <=4 SENSITIVE Sensitive     CEFTRIAXONE <=1 SENSITIVE Sensitive     CIPROFLOXACIN 1 SENSITIVE Sensitive     GENTAMICIN <=1 SENSITIVE Sensitive     IMIPENEM <=0.25 SENSITIVE Sensitive     NITROFURANTOIN >=512 RESISTANT Resistant     TRIMETH/SULFA <=20 SENSITIVE Sensitive     AMPICILLIN/SULBACTAM 16 INTERMEDIATE Intermediate     PIP/TAZO 8 SENSITIVE Sensitive     Extended ESBL NEGATIVE Sensitive     * >=100,000 COLONIES/mL KLEBSIELLA PNEUMONIAE  SARS Coronavirus 2 (CEPHEID - Performed in [place] hospital lab), Hosp Order     Status: None   Collection Time: 12/21/18  3:28 AM   Specimen: Nasopharyngeal Swab  Result Value Ref Range Status   SARS Coronavirus 2 NEGATIVE NEGATIVE Final    Comment: (NOTE) If result is NEGATIVE SARS-CoV-2 target nucleic acids are NOT DETECTED. The SARS-CoV-2 RNA is generally detectable in upper and lower  respiratory specimens during the acute phase of infection. The lowest  concentration of SARS-CoV-2 viral copies this assay can detect is 250  copies / mL. A negative result does not preclude SARS-CoV-2 infection  and should not be used as the sole basis for treatment or other  patient management decisions.  A negative result may occur with  improper specimen collection / handling, submission  of specimen other  than nasopharyngeal swab, presence of viral mutation(s) within the  areas targeted by this  assay, and inadequate number of viral copies  (<250 copies / mL). A negative result must be combined with clinical  observations, patient history, and epidemiological information. If result is POSITIVE SARS-CoV-2 target nucleic acids are DETECTED. The SARS-CoV-2 RNA is generally detectable in upper and lower  respiratory specimens dur ing the acute phase of infection.  Positive  results are indicative of active infection with SARS-CoV-2.  Clinical  correlation with patient history and other diagnostic information is  necessary to determine patient infection status.  Positive results do  not rule out bacterial infection or co-infection with other viruses. If result is PRESUMPTIVE POSTIVE SARS-CoV-2 nucleic acids MAY BE PRESENT.   A presumptive positive result was obtained on the submitted specimen  and confirmed on repeat testing.  While 2019 novel coronavirus  (SARS-CoV-2) nucleic acids may be present in the submitted sample  additional confirmatory testing may be necessary for epidemiological  and / or clinical management purposes  to differentiate between  SARS-CoV-2 and other Sarbecovirus currently known to infect humans.  If clinically indicated additional testing with an alternate test  methodology [phone]) is advised. The SARS-CoV-2 RNA is generally  detectable in upper and lower respiratory sp ecimens during the acute  phase of infection. The expected result is Negative. Fact Sheet for Patients:  StrictlyIdeas.no Fact Sheet for Healthcare Providers: BankingDealers.co.za This test is not yet approved or cleared by the [place] FDA and has been authorized for detection and/or diagnosis of SARS-CoV-2 by FDA under an Emergency Use Authorization (EUA).  This EUA will remain in effect (meaning this test can be used) for  the duration of the COVID-19 declaration under Section 564(b)(1) of the Act, 21 U.S.C. section 360bbb-3(b)(1), unless the authorization is terminated or revoked sooner. Performed at [facility], [place] [address], [place], [place] [address]      Time coordinating discharge:  I have spent 35 minutes face to face with the patient and on the ward discussing the patients care, assessment, plan and disposition with other care givers. >50% of the time was devoted counseling the patient about the risks and benefits of treatment/Discharge disposition and coordinating care.   SIGNED:   Damita Lack, MD  [facility] 12/22/2018, 11:22 AM   If 7PM-7AM, please contact night-coverage [email or web address]

## 2018-12-22 NOTE — Progress Notes (Signed)
ANTICOAGULATION CONSULT NOTE - Follow-up Consult  Pharmacy Consult for Warfarin Indication: a-fib, mechanical valve (mitral, aortic)  Allergies  Allergen Reactions  . Codeine Nausea And Vomiting    Patient Measurements: Height: 5\' 4"  (162.6 cm) Weight: 142 lb 3.2 oz (64.5 kg) IBW/kg (Calculated) : 54.7   Vital Signs: Temp: 98.5 F (36.9 C) (06/27 0407) Temp Source: Oral (06/27 0407) BP: 187/75 (06/27 0407) Pulse Rate: 88 (06/27 0407)  Labs: Recent Labs    12/21/18 0328 12/21/18 0807 12/22/18 0253  HGB 13.2  --  13.4  HCT 39.0  --  41.7  PLT 174  --  187  LABPROT  --  29.0* 26.4*  INR  --  2.8* 2.5*  CREATININE 0.71  --  0.59    Estimated Creatinine Clearance: 48.4 mL/min (by C-G formula based on SCr of 0.59 mg/dL).   Assessment: 74 yoF on chronic warfarin for a-fib, mechanical valve admitted for UTI. Pharmacy to dose warfarin while admitted.   Per PCP office, INR goal 2.5-3.5 as of May 2020. During 2018 admission, Dr. Debara Pickett recommended goal 3-3.5.    Baseline INR therapeutic  Prior anticoagulation: warfarin 2.5 mg daily   Significant events:  Today, 12/22/2018:  CBC: WNL  INR therapeutic but at low end of normal - slight downward trend  Major drug interactions: none  No bleeding issues per nursing  Goal of Therapy: INR 2.5-3.5  Plan:  Warfarin 2.5 mg PO tonight at 18:00   Daily INR  CBC at least q72 hr while on warfarin  Monitor for signs of bleeding or thrombosis  Pt being discharged home today   Sherlon Handing, PharmD, BCPS Clinical pharmacist. 12/22/2018, 8:35 AM

## 2018-12-22 NOTE — Plan of Care (Signed)
Pt ready for D\C

## 2019-01-04 DIAGNOSIS — R309 Painful micturition, unspecified: Secondary | ICD-10-CM | POA: Diagnosis not present

## 2019-01-04 DIAGNOSIS — Z960 Presence of urogenital implants: Secondary | ICD-10-CM | POA: Diagnosis not present

## 2019-01-04 DIAGNOSIS — Z954 Presence of other heart-valve replacement: Secondary | ICD-10-CM | POA: Diagnosis not present

## 2019-01-04 DIAGNOSIS — N8110 Cystocele, unspecified: Secondary | ICD-10-CM | POA: Diagnosis not present

## 2019-01-04 DIAGNOSIS — N814 Uterovaginal prolapse, unspecified: Secondary | ICD-10-CM | POA: Diagnosis not present

## 2019-01-04 DIAGNOSIS — N390 Urinary tract infection, site not specified: Secondary | ICD-10-CM | POA: Diagnosis not present

## 2019-01-11 ENCOUNTER — Telehealth: Payer: Self-pay

## 2019-01-11 ENCOUNTER — Encounter: Payer: PPO | Admitting: *Deleted

## 2019-01-11 NOTE — Telephone Encounter (Signed)
Left message for patient to remind of missed remote transmission.  

## 2019-01-16 ENCOUNTER — Encounter: Payer: Self-pay | Admitting: Cardiology

## 2019-01-29 DIAGNOSIS — R944 Abnormal results of kidney function studies: Secondary | ICD-10-CM | POA: Diagnosis not present

## 2019-01-29 DIAGNOSIS — Z7901 Long term (current) use of anticoagulants: Secondary | ICD-10-CM | POA: Diagnosis not present

## 2019-02-01 ENCOUNTER — Ambulatory Visit (INDEPENDENT_AMBULATORY_CARE_PROVIDER_SITE_OTHER): Payer: PPO | Admitting: *Deleted

## 2019-02-01 DIAGNOSIS — I4821 Permanent atrial fibrillation: Secondary | ICD-10-CM

## 2019-02-01 LAB — CUP PACEART REMOTE DEVICE CHECK
Battery Impedance: 3252 Ohm
Battery Remaining Longevity: 21 mo
Battery Voltage: 2.74 V
Brady Statistic RV Percent Paced: 54 %
Date Time Interrogation Session: 20200807132755
Implantable Lead Implant Date: 19980924
Implantable Lead Location: 753860
Implantable Lead Model: 5068
Implantable Pulse Generator Implant Date: 20130429
Lead Channel Impedance Value: 0 Ohm
Lead Channel Impedance Value: 1161 Ohm
Lead Channel Pacing Threshold Amplitude: 1 V
Lead Channel Pacing Threshold Pulse Width: 0.4 ms
Lead Channel Setting Pacing Amplitude: 2 V
Lead Channel Setting Pacing Pulse Width: 0.4 ms
Lead Channel Setting Sensing Sensitivity: 5.6 mV

## 2019-02-04 ENCOUNTER — Other Ambulatory Visit: Payer: Self-pay

## 2019-02-05 ENCOUNTER — Encounter: Payer: Self-pay | Admitting: Cardiology

## 2019-02-05 NOTE — Progress Notes (Signed)
Remote pacemaker transmission.   

## 2019-03-03 DIAGNOSIS — N390 Urinary tract infection, site not specified: Secondary | ICD-10-CM | POA: Diagnosis not present

## 2019-03-03 DIAGNOSIS — I10 Essential (primary) hypertension: Secondary | ICD-10-CM | POA: Diagnosis not present

## 2019-03-03 DIAGNOSIS — R1030 Lower abdominal pain, unspecified: Secondary | ICD-10-CM | POA: Diagnosis not present

## 2019-03-03 DIAGNOSIS — Z79899 Other long term (current) drug therapy: Secondary | ICD-10-CM | POA: Diagnosis not present

## 2019-03-08 DIAGNOSIS — I480 Paroxysmal atrial fibrillation: Secondary | ICD-10-CM | POA: Diagnosis not present

## 2019-03-29 DIAGNOSIS — S32020D Wedge compression fracture of second lumbar vertebra, subsequent encounter for fracture with routine healing: Secondary | ICD-10-CM | POA: Diagnosis not present

## 2019-03-29 DIAGNOSIS — I4821 Permanent atrial fibrillation: Secondary | ICD-10-CM | POA: Diagnosis not present

## 2019-03-29 DIAGNOSIS — D649 Anemia, unspecified: Secondary | ICD-10-CM | POA: Diagnosis not present

## 2019-03-29 DIAGNOSIS — E7800 Pure hypercholesterolemia, unspecified: Secondary | ICD-10-CM | POA: Diagnosis not present

## 2019-03-29 DIAGNOSIS — Z23 Encounter for immunization: Secondary | ICD-10-CM | POA: Diagnosis not present

## 2019-03-29 DIAGNOSIS — I099 Rheumatic heart disease, unspecified: Secondary | ICD-10-CM | POA: Diagnosis not present

## 2019-03-29 DIAGNOSIS — I10 Essential (primary) hypertension: Secondary | ICD-10-CM | POA: Diagnosis not present

## 2019-03-29 DIAGNOSIS — M810 Age-related osteoporosis without current pathological fracture: Secondary | ICD-10-CM | POA: Diagnosis not present

## 2019-03-29 DIAGNOSIS — Z950 Presence of cardiac pacemaker: Secondary | ICD-10-CM | POA: Diagnosis not present

## 2019-03-29 DIAGNOSIS — Z952 Presence of prosthetic heart valve: Secondary | ICD-10-CM | POA: Diagnosis not present

## 2019-03-29 DIAGNOSIS — D689 Coagulation defect, unspecified: Secondary | ICD-10-CM | POA: Diagnosis not present

## 2019-03-29 DIAGNOSIS — Z7901 Long term (current) use of anticoagulants: Secondary | ICD-10-CM | POA: Diagnosis not present

## 2019-03-29 DIAGNOSIS — E119 Type 2 diabetes mellitus without complications: Secondary | ICD-10-CM | POA: Diagnosis not present

## 2019-03-29 DIAGNOSIS — I2721 Secondary pulmonary arterial hypertension: Secondary | ICD-10-CM | POA: Diagnosis not present

## 2019-03-29 DIAGNOSIS — R7303 Prediabetes: Secondary | ICD-10-CM | POA: Diagnosis not present

## 2019-03-29 DIAGNOSIS — M8000XD Age-related osteoporosis with current pathological fracture, unspecified site, subsequent encounter for fracture with routine healing: Secondary | ICD-10-CM | POA: Diagnosis not present

## 2019-04-03 DIAGNOSIS — H3589 Other specified retinal disorders: Secondary | ICD-10-CM | POA: Diagnosis not present

## 2019-04-03 DIAGNOSIS — E119 Type 2 diabetes mellitus without complications: Secondary | ICD-10-CM | POA: Diagnosis not present

## 2019-04-03 DIAGNOSIS — D3131 Benign neoplasm of right choroid: Secondary | ICD-10-CM | POA: Diagnosis not present

## 2019-04-03 DIAGNOSIS — H524 Presbyopia: Secondary | ICD-10-CM | POA: Diagnosis not present

## 2019-04-19 ENCOUNTER — Other Ambulatory Visit (HOSPITAL_COMMUNITY): Payer: PPO

## 2019-04-24 DIAGNOSIS — M549 Dorsalgia, unspecified: Secondary | ICD-10-CM | POA: Diagnosis not present

## 2019-04-24 DIAGNOSIS — N3001 Acute cystitis with hematuria: Secondary | ICD-10-CM | POA: Diagnosis not present

## 2019-04-26 DIAGNOSIS — D689 Coagulation defect, unspecified: Secondary | ICD-10-CM | POA: Diagnosis not present

## 2019-04-26 DIAGNOSIS — M810 Age-related osteoporosis without current pathological fracture: Secondary | ICD-10-CM | POA: Diagnosis not present

## 2019-05-03 ENCOUNTER — Encounter: Payer: PPO | Admitting: *Deleted

## 2019-05-08 DIAGNOSIS — D689 Coagulation defect, unspecified: Secondary | ICD-10-CM | POA: Diagnosis not present

## 2019-05-08 DIAGNOSIS — S22050D Wedge compression fracture of T5-T6 vertebra, subsequent encounter for fracture with routine healing: Secondary | ICD-10-CM | POA: Diagnosis not present

## 2019-05-08 DIAGNOSIS — Z6827 Body mass index (BMI) 27.0-27.9, adult: Secondary | ICD-10-CM | POA: Diagnosis not present

## 2019-05-08 DIAGNOSIS — R54 Age-related physical debility: Secondary | ICD-10-CM | POA: Diagnosis not present

## 2019-05-08 DIAGNOSIS — Z789 Other specified health status: Secondary | ICD-10-CM | POA: Diagnosis not present

## 2019-05-08 DIAGNOSIS — I10 Essential (primary) hypertension: Secondary | ICD-10-CM | POA: Diagnosis not present

## 2019-05-08 DIAGNOSIS — S32020D Wedge compression fracture of second lumbar vertebra, subsequent encounter for fracture with routine healing: Secondary | ICD-10-CM | POA: Diagnosis not present

## 2019-05-08 DIAGNOSIS — Z9989 Dependence on other enabling machines and devices: Secondary | ICD-10-CM | POA: Diagnosis not present

## 2019-05-08 DIAGNOSIS — M160 Bilateral primary osteoarthritis of hip: Secondary | ICD-10-CM | POA: Diagnosis not present

## 2019-05-08 DIAGNOSIS — M8000XD Age-related osteoporosis with current pathological fracture, unspecified site, subsequent encounter for fracture with routine healing: Secondary | ICD-10-CM | POA: Diagnosis not present

## 2019-05-08 DIAGNOSIS — I4821 Permanent atrial fibrillation: Secondary | ICD-10-CM | POA: Diagnosis not present

## 2019-05-08 DIAGNOSIS — E7800 Pure hypercholesterolemia, unspecified: Secondary | ICD-10-CM | POA: Diagnosis not present

## 2019-05-10 DIAGNOSIS — Z7901 Long term (current) use of anticoagulants: Secondary | ICD-10-CM | POA: Diagnosis not present

## 2019-05-10 DIAGNOSIS — I4821 Permanent atrial fibrillation: Secondary | ICD-10-CM | POA: Diagnosis not present

## 2019-05-10 DIAGNOSIS — I099 Rheumatic heart disease, unspecified: Secondary | ICD-10-CM | POA: Diagnosis not present

## 2019-05-10 DIAGNOSIS — I5032 Chronic diastolic (congestive) heart failure: Secondary | ICD-10-CM | POA: Diagnosis not present

## 2019-05-10 DIAGNOSIS — Z952 Presence of prosthetic heart valve: Secondary | ICD-10-CM | POA: Diagnosis not present

## 2019-05-20 DIAGNOSIS — Z7901 Long term (current) use of anticoagulants: Secondary | ICD-10-CM | POA: Diagnosis not present

## 2019-05-28 DIAGNOSIS — N814 Uterovaginal prolapse, unspecified: Secondary | ICD-10-CM | POA: Diagnosis not present

## 2019-05-28 DIAGNOSIS — N3000 Acute cystitis without hematuria: Secondary | ICD-10-CM | POA: Diagnosis not present

## 2019-06-07 DIAGNOSIS — Z7901 Long term (current) use of anticoagulants: Secondary | ICD-10-CM | POA: Diagnosis not present

## 2019-06-14 DIAGNOSIS — Z7901 Long term (current) use of anticoagulants: Secondary | ICD-10-CM | POA: Diagnosis not present

## 2019-06-19 DIAGNOSIS — Z9889 Other specified postprocedural states: Secondary | ICD-10-CM | POA: Diagnosis not present

## 2019-06-19 DIAGNOSIS — S22050D Wedge compression fracture of T5-T6 vertebra, subsequent encounter for fracture with routine healing: Secondary | ICD-10-CM | POA: Diagnosis not present

## 2019-06-19 DIAGNOSIS — M545 Low back pain: Secondary | ICD-10-CM | POA: Diagnosis not present

## 2019-06-19 DIAGNOSIS — S32020D Wedge compression fracture of second lumbar vertebra, subsequent encounter for fracture with routine healing: Secondary | ICD-10-CM | POA: Diagnosis not present

## 2019-06-19 DIAGNOSIS — M546 Pain in thoracic spine: Secondary | ICD-10-CM | POA: Diagnosis not present

## 2019-06-19 DIAGNOSIS — Z8781 Personal history of (healed) traumatic fracture: Secondary | ICD-10-CM | POA: Diagnosis not present

## 2019-06-29 DIAGNOSIS — M545 Low back pain: Secondary | ICD-10-CM | POA: Diagnosis not present

## 2019-06-29 DIAGNOSIS — Z7901 Long term (current) use of anticoagulants: Secondary | ICD-10-CM | POA: Diagnosis not present

## 2019-06-29 DIAGNOSIS — Z79899 Other long term (current) drug therapy: Secondary | ICD-10-CM | POA: Diagnosis not present

## 2019-06-29 DIAGNOSIS — M549 Dorsalgia, unspecified: Secondary | ICD-10-CM | POA: Diagnosis not present

## 2019-06-29 DIAGNOSIS — I110 Hypertensive heart disease with heart failure: Secondary | ICD-10-CM | POA: Diagnosis not present

## 2019-06-29 DIAGNOSIS — I4821 Permanent atrial fibrillation: Secondary | ICD-10-CM | POA: Diagnosis not present

## 2019-06-29 DIAGNOSIS — M47816 Spondylosis without myelopathy or radiculopathy, lumbar region: Secondary | ICD-10-CM | POA: Diagnosis not present

## 2019-06-29 DIAGNOSIS — M4856XA Collapsed vertebra, not elsewhere classified, lumbar region, initial encounter for fracture: Secondary | ICD-10-CM | POA: Diagnosis not present

## 2019-06-29 DIAGNOSIS — M8580 Other specified disorders of bone density and structure, unspecified site: Secondary | ICD-10-CM | POA: Diagnosis not present

## 2019-06-29 DIAGNOSIS — M4856XS Collapsed vertebra, not elsewhere classified, lumbar region, sequela of fracture: Secondary | ICD-10-CM | POA: Diagnosis not present

## 2019-06-29 DIAGNOSIS — M810 Age-related osteoporosis without current pathological fracture: Secondary | ICD-10-CM | POA: Diagnosis not present

## 2019-06-29 DIAGNOSIS — I5032 Chronic diastolic (congestive) heart failure: Secondary | ICD-10-CM | POA: Diagnosis not present

## 2019-06-29 DIAGNOSIS — E119 Type 2 diabetes mellitus without complications: Secondary | ICD-10-CM | POA: Diagnosis not present

## 2019-06-29 DIAGNOSIS — E7800 Pure hypercholesterolemia, unspecified: Secondary | ICD-10-CM | POA: Diagnosis not present

## 2019-07-05 DIAGNOSIS — Z7901 Long term (current) use of anticoagulants: Secondary | ICD-10-CM | POA: Diagnosis not present

## 2019-08-14 DIAGNOSIS — Z7901 Long term (current) use of anticoagulants: Secondary | ICD-10-CM | POA: Diagnosis not present

## 2019-09-04 DIAGNOSIS — I10 Essential (primary) hypertension: Secondary | ICD-10-CM | POA: Diagnosis not present

## 2019-09-04 DIAGNOSIS — D689 Coagulation defect, unspecified: Secondary | ICD-10-CM | POA: Diagnosis not present

## 2019-09-04 DIAGNOSIS — Z6823 Body mass index (BMI) 23.0-23.9, adult: Secondary | ICD-10-CM | POA: Diagnosis not present

## 2019-09-04 DIAGNOSIS — I4821 Permanent atrial fibrillation: Secondary | ICD-10-CM | POA: Diagnosis not present

## 2019-09-04 DIAGNOSIS — Z9989 Dependence on other enabling machines and devices: Secondary | ICD-10-CM | POA: Diagnosis not present

## 2019-09-04 DIAGNOSIS — R7303 Prediabetes: Secondary | ICD-10-CM | POA: Diagnosis not present

## 2019-09-04 DIAGNOSIS — K529 Noninfective gastroenteritis and colitis, unspecified: Secondary | ICD-10-CM | POA: Diagnosis not present

## 2019-09-04 DIAGNOSIS — M810 Age-related osteoporosis without current pathological fracture: Secondary | ICD-10-CM | POA: Diagnosis not present

## 2019-09-04 DIAGNOSIS — S22050D Wedge compression fracture of T5-T6 vertebra, subsequent encounter for fracture with routine healing: Secondary | ICD-10-CM | POA: Diagnosis not present

## 2019-09-04 DIAGNOSIS — S32020D Wedge compression fracture of second lumbar vertebra, subsequent encounter for fracture with routine healing: Secondary | ICD-10-CM | POA: Diagnosis not present

## 2019-09-04 DIAGNOSIS — I5032 Chronic diastolic (congestive) heart failure: Secondary | ICD-10-CM | POA: Diagnosis not present

## 2019-09-04 DIAGNOSIS — M8000XD Age-related osteoporosis with current pathological fracture, unspecified site, subsequent encounter for fracture with routine healing: Secondary | ICD-10-CM | POA: Diagnosis not present

## 2019-09-17 DIAGNOSIS — Z7901 Long term (current) use of anticoagulants: Secondary | ICD-10-CM | POA: Diagnosis not present

## 2019-09-18 DIAGNOSIS — D689 Coagulation defect, unspecified: Secondary | ICD-10-CM | POA: Diagnosis not present

## 2019-09-18 DIAGNOSIS — Z7901 Long term (current) use of anticoagulants: Secondary | ICD-10-CM | POA: Diagnosis not present

## 2019-10-23 DIAGNOSIS — Z7901 Long term (current) use of anticoagulants: Secondary | ICD-10-CM | POA: Diagnosis not present

## 2019-11-01 ENCOUNTER — Telehealth: Payer: Self-pay

## 2019-11-01 NOTE — Telephone Encounter (Signed)
Spoke with patient to remind of missed remote transmission 

## 2019-11-06 DIAGNOSIS — Z7901 Long term (current) use of anticoagulants: Secondary | ICD-10-CM | POA: Diagnosis not present

## 2019-11-06 DIAGNOSIS — D689 Coagulation defect, unspecified: Secondary | ICD-10-CM | POA: Diagnosis not present

## 2019-11-22 DIAGNOSIS — Z7901 Long term (current) use of anticoagulants: Secondary | ICD-10-CM | POA: Diagnosis not present

## 2019-11-27 DIAGNOSIS — Z7901 Long term (current) use of anticoagulants: Secondary | ICD-10-CM | POA: Diagnosis not present

## 2019-11-27 DIAGNOSIS — D689 Coagulation defect, unspecified: Secondary | ICD-10-CM | POA: Diagnosis not present

## 2019-12-25 DIAGNOSIS — Z7901 Long term (current) use of anticoagulants: Secondary | ICD-10-CM | POA: Diagnosis not present

## 2019-12-27 DIAGNOSIS — Z7901 Long term (current) use of anticoagulants: Secondary | ICD-10-CM | POA: Diagnosis not present

## 2020-01-06 DIAGNOSIS — D689 Coagulation defect, unspecified: Secondary | ICD-10-CM | POA: Diagnosis not present

## 2020-01-06 DIAGNOSIS — D649 Anemia, unspecified: Secondary | ICD-10-CM | POA: Diagnosis not present

## 2020-01-06 DIAGNOSIS — Z6822 Body mass index (BMI) 22.0-22.9, adult: Secondary | ICD-10-CM | POA: Diagnosis not present

## 2020-01-06 DIAGNOSIS — R54 Age-related physical debility: Secondary | ICD-10-CM | POA: Diagnosis not present

## 2020-01-06 DIAGNOSIS — I10 Essential (primary) hypertension: Secondary | ICD-10-CM | POA: Diagnosis not present

## 2020-01-06 DIAGNOSIS — M25552 Pain in left hip: Secondary | ICD-10-CM | POA: Diagnosis not present

## 2020-01-06 DIAGNOSIS — M160 Bilateral primary osteoarthritis of hip: Secondary | ICD-10-CM | POA: Diagnosis not present

## 2020-01-06 DIAGNOSIS — M545 Low back pain: Secondary | ICD-10-CM | POA: Diagnosis not present

## 2020-01-06 DIAGNOSIS — Z950 Presence of cardiac pacemaker: Secondary | ICD-10-CM | POA: Diagnosis not present

## 2020-01-06 DIAGNOSIS — M8580 Other specified disorders of bone density and structure, unspecified site: Secondary | ICD-10-CM | POA: Diagnosis not present

## 2020-01-06 DIAGNOSIS — M25551 Pain in right hip: Secondary | ICD-10-CM | POA: Diagnosis not present

## 2020-01-06 DIAGNOSIS — Z789 Other specified health status: Secondary | ICD-10-CM | POA: Diagnosis not present

## 2020-01-06 DIAGNOSIS — S22050D Wedge compression fracture of T5-T6 vertebra, subsequent encounter for fracture with routine healing: Secondary | ICD-10-CM | POA: Diagnosis not present

## 2020-01-06 DIAGNOSIS — Z7901 Long term (current) use of anticoagulants: Secondary | ICD-10-CM | POA: Diagnosis not present

## 2020-01-06 DIAGNOSIS — G8929 Other chronic pain: Secondary | ICD-10-CM | POA: Diagnosis not present

## 2020-01-06 DIAGNOSIS — M8000XD Age-related osteoporosis with current pathological fracture, unspecified site, subsequent encounter for fracture with routine healing: Secondary | ICD-10-CM | POA: Diagnosis not present

## 2020-01-06 DIAGNOSIS — I4821 Permanent atrial fibrillation: Secondary | ICD-10-CM | POA: Diagnosis not present

## 2020-01-06 DIAGNOSIS — R7303 Prediabetes: Secondary | ICD-10-CM | POA: Diagnosis not present

## 2020-02-10 DIAGNOSIS — Z7901 Long term (current) use of anticoagulants: Secondary | ICD-10-CM | POA: Diagnosis not present

## 2020-02-11 DIAGNOSIS — I4821 Permanent atrial fibrillation: Secondary | ICD-10-CM | POA: Diagnosis not present

## 2020-02-11 DIAGNOSIS — Z950 Presence of cardiac pacemaker: Secondary | ICD-10-CM | POA: Diagnosis not present

## 2020-02-11 DIAGNOSIS — Z7901 Long term (current) use of anticoagulants: Secondary | ICD-10-CM | POA: Diagnosis not present

## 2020-02-11 DIAGNOSIS — Z952 Presence of prosthetic heart valve: Secondary | ICD-10-CM | POA: Diagnosis not present

## 2020-02-11 DIAGNOSIS — I10 Essential (primary) hypertension: Secondary | ICD-10-CM | POA: Diagnosis not present

## 2020-02-13 DIAGNOSIS — I5032 Chronic diastolic (congestive) heart failure: Secondary | ICD-10-CM | POA: Diagnosis not present

## 2020-02-13 DIAGNOSIS — N8189 Other female genital prolapse: Secondary | ICD-10-CM | POA: Diagnosis not present

## 2020-02-13 DIAGNOSIS — I4821 Permanent atrial fibrillation: Secondary | ICD-10-CM | POA: Diagnosis not present

## 2020-02-13 DIAGNOSIS — I10 Essential (primary) hypertension: Secondary | ICD-10-CM | POA: Diagnosis not present

## 2020-02-14 ENCOUNTER — Telehealth: Payer: Self-pay

## 2020-02-14 NOTE — Telephone Encounter (Signed)
The pt has been released to [facility]. The pt agreed to be followed by Dr. Rolena Infante.

## 2020-02-17 DIAGNOSIS — Z7901 Long term (current) use of anticoagulants: Secondary | ICD-10-CM | POA: Diagnosis not present

## 2020-02-17 DIAGNOSIS — I4821 Permanent atrial fibrillation: Secondary | ICD-10-CM | POA: Diagnosis not present

## 2020-02-20 DIAGNOSIS — Z45010 Encounter for checking and testing of cardiac pacemaker pulse generator [battery]: Secondary | ICD-10-CM | POA: Diagnosis not present

## 2020-03-04 DIAGNOSIS — Z7901 Long term (current) use of anticoagulants: Secondary | ICD-10-CM | POA: Diagnosis not present

## 2020-03-10 DIAGNOSIS — Z7901 Long term (current) use of anticoagulants: Secondary | ICD-10-CM | POA: Diagnosis not present

## 2020-03-10 DIAGNOSIS — D689 Coagulation defect, unspecified: Secondary | ICD-10-CM | POA: Diagnosis not present

## 2020-04-07 DIAGNOSIS — I5032 Chronic diastolic (congestive) heart failure: Secondary | ICD-10-CM | POA: Diagnosis not present

## 2020-04-07 DIAGNOSIS — Z789 Other specified health status: Secondary | ICD-10-CM | POA: Diagnosis not present

## 2020-04-07 DIAGNOSIS — E559 Vitamin D deficiency, unspecified: Secondary | ICD-10-CM | POA: Diagnosis not present

## 2020-04-07 DIAGNOSIS — I10 Essential (primary) hypertension: Secondary | ICD-10-CM | POA: Diagnosis not present

## 2020-04-07 DIAGNOSIS — S22050D Wedge compression fracture of T5-T6 vertebra, subsequent encounter for fracture with routine healing: Secondary | ICD-10-CM | POA: Diagnosis not present

## 2020-04-07 DIAGNOSIS — M8000XD Age-related osteoporosis with current pathological fracture, unspecified site, subsequent encounter for fracture with routine healing: Secondary | ICD-10-CM | POA: Diagnosis not present

## 2020-04-07 DIAGNOSIS — R54 Age-related physical debility: Secondary | ICD-10-CM | POA: Diagnosis not present

## 2020-04-07 DIAGNOSIS — Z23 Encounter for immunization: Secondary | ICD-10-CM | POA: Diagnosis not present

## 2020-04-07 DIAGNOSIS — I4821 Permanent atrial fibrillation: Secondary | ICD-10-CM | POA: Diagnosis not present

## 2020-04-07 DIAGNOSIS — E7800 Pure hypercholesterolemia, unspecified: Secondary | ICD-10-CM | POA: Diagnosis not present

## 2020-04-07 DIAGNOSIS — S32020D Wedge compression fracture of second lumbar vertebra, subsequent encounter for fracture with routine healing: Secondary | ICD-10-CM | POA: Diagnosis not present

## 2020-04-07 DIAGNOSIS — R7303 Prediabetes: Secondary | ICD-10-CM | POA: Diagnosis not present

## 2020-04-07 DIAGNOSIS — Z6822 Body mass index (BMI) 22.0-22.9, adult: Secondary | ICD-10-CM | POA: Diagnosis not present

## 2020-04-09 DIAGNOSIS — D689 Coagulation defect, unspecified: Secondary | ICD-10-CM | POA: Diagnosis not present

## 2020-05-13 DIAGNOSIS — Z7901 Long term (current) use of anticoagulants: Secondary | ICD-10-CM | POA: Diagnosis not present

## 2020-05-14 DIAGNOSIS — D689 Coagulation defect, unspecified: Secondary | ICD-10-CM | POA: Diagnosis not present

## 2020-05-14 DIAGNOSIS — Z7901 Long term (current) use of anticoagulants: Secondary | ICD-10-CM | POA: Diagnosis not present

## 2020-05-23 DIAGNOSIS — J189 Pneumonia, unspecified organism: Secondary | ICD-10-CM | POA: Diagnosis not present

## 2020-05-23 DIAGNOSIS — N390 Urinary tract infection, site not specified: Secondary | ICD-10-CM | POA: Diagnosis not present

## 2020-05-23 DIAGNOSIS — Z79899 Other long term (current) drug therapy: Secondary | ICD-10-CM | POA: Diagnosis not present

## 2020-05-23 DIAGNOSIS — K5730 Diverticulosis of large intestine without perforation or abscess without bleeding: Secondary | ICD-10-CM | POA: Diagnosis not present

## 2020-05-23 DIAGNOSIS — Z950 Presence of cardiac pacemaker: Secondary | ICD-10-CM | POA: Diagnosis not present

## 2020-05-23 DIAGNOSIS — E1165 Type 2 diabetes mellitus with hyperglycemia: Secondary | ICD-10-CM | POA: Diagnosis not present

## 2020-05-23 DIAGNOSIS — Z7901 Long term (current) use of anticoagulants: Secondary | ICD-10-CM | POA: Diagnosis not present

## 2020-05-23 DIAGNOSIS — Z87310 Personal history of (healed) osteoporosis fracture: Secondary | ICD-10-CM | POA: Diagnosis not present

## 2020-05-23 DIAGNOSIS — R0602 Shortness of breath: Secondary | ICD-10-CM | POA: Diagnosis not present

## 2020-05-23 DIAGNOSIS — M545 Low back pain, unspecified: Secondary | ICD-10-CM | POA: Diagnosis not present

## 2020-05-23 DIAGNOSIS — I5032 Chronic diastolic (congestive) heart failure: Secondary | ICD-10-CM | POA: Diagnosis not present

## 2020-05-23 DIAGNOSIS — M546 Pain in thoracic spine: Secondary | ICD-10-CM | POA: Diagnosis not present

## 2020-05-23 DIAGNOSIS — G8929 Other chronic pain: Secondary | ICD-10-CM | POA: Diagnosis not present

## 2020-05-23 DIAGNOSIS — Z952 Presence of prosthetic heart valve: Secondary | ICD-10-CM | POA: Diagnosis not present

## 2020-05-23 DIAGNOSIS — I110 Hypertensive heart disease with heart failure: Secondary | ICD-10-CM | POA: Diagnosis not present

## 2020-05-23 DIAGNOSIS — I4821 Permanent atrial fibrillation: Secondary | ICD-10-CM | POA: Diagnosis not present

## 2020-05-23 DIAGNOSIS — I517 Cardiomegaly: Secondary | ICD-10-CM | POA: Diagnosis not present

## 2020-05-26 DIAGNOSIS — M8008XD Age-related osteoporosis with current pathological fracture, vertebra(e), subsequent encounter for fracture with routine healing: Secondary | ICD-10-CM | POA: Diagnosis not present

## 2020-05-26 DIAGNOSIS — Z7901 Long term (current) use of anticoagulants: Secondary | ICD-10-CM | POA: Diagnosis not present

## 2020-05-26 DIAGNOSIS — M40202 Unspecified kyphosis, cervical region: Secondary | ICD-10-CM | POA: Diagnosis not present

## 2020-05-26 DIAGNOSIS — Z79899 Other long term (current) drug therapy: Secondary | ICD-10-CM | POA: Diagnosis not present

## 2020-05-26 DIAGNOSIS — I4821 Permanent atrial fibrillation: Secondary | ICD-10-CM | POA: Diagnosis not present

## 2020-05-26 DIAGNOSIS — R0600 Dyspnea, unspecified: Secondary | ICD-10-CM | POA: Diagnosis not present

## 2020-05-26 DIAGNOSIS — Z87311 Personal history of (healed) other pathological fracture: Secondary | ICD-10-CM | POA: Diagnosis not present

## 2020-05-26 DIAGNOSIS — I5032 Chronic diastolic (congestive) heart failure: Secondary | ICD-10-CM | POA: Diagnosis not present

## 2020-05-26 DIAGNOSIS — J811 Chronic pulmonary edema: Secondary | ICD-10-CM | POA: Diagnosis not present

## 2020-05-26 DIAGNOSIS — Z950 Presence of cardiac pacemaker: Secondary | ICD-10-CM | POA: Diagnosis not present

## 2020-05-26 DIAGNOSIS — I110 Hypertensive heart disease with heart failure: Secondary | ICD-10-CM | POA: Diagnosis not present

## 2020-05-26 DIAGNOSIS — M546 Pain in thoracic spine: Secondary | ICD-10-CM | POA: Diagnosis not present

## 2020-05-26 DIAGNOSIS — Z792 Long term (current) use of antibiotics: Secondary | ICD-10-CM | POA: Diagnosis not present

## 2020-05-26 DIAGNOSIS — N390 Urinary tract infection, site not specified: Secondary | ICD-10-CM | POA: Diagnosis not present

## 2020-05-26 DIAGNOSIS — Z79891 Long term (current) use of opiate analgesic: Secondary | ICD-10-CM | POA: Diagnosis not present

## 2020-05-26 DIAGNOSIS — I517 Cardiomegaly: Secondary | ICD-10-CM | POA: Diagnosis not present

## 2020-06-02 DIAGNOSIS — E860 Dehydration: Secondary | ICD-10-CM | POA: Diagnosis not present

## 2020-06-02 DIAGNOSIS — R1030 Lower abdominal pain, unspecified: Secondary | ICD-10-CM | POA: Diagnosis not present

## 2020-06-02 DIAGNOSIS — R109 Unspecified abdominal pain: Secondary | ICD-10-CM | POA: Diagnosis not present

## 2020-06-02 DIAGNOSIS — M546 Pain in thoracic spine: Secondary | ICD-10-CM | POA: Diagnosis not present

## 2020-06-02 DIAGNOSIS — M545 Low back pain, unspecified: Secondary | ICD-10-CM | POA: Diagnosis not present

## 2020-06-02 DIAGNOSIS — M8008XD Age-related osteoporosis with current pathological fracture, vertebra(e), subsequent encounter for fracture with routine healing: Secondary | ICD-10-CM | POA: Diagnosis not present

## 2020-06-02 DIAGNOSIS — Z952 Presence of prosthetic heart valve: Secondary | ICD-10-CM | POA: Diagnosis not present

## 2020-06-02 DIAGNOSIS — I4821 Permanent atrial fibrillation: Secondary | ICD-10-CM | POA: Diagnosis not present

## 2020-06-02 DIAGNOSIS — R110 Nausea: Secondary | ICD-10-CM | POA: Diagnosis not present

## 2020-06-02 DIAGNOSIS — I10 Essential (primary) hypertension: Secondary | ICD-10-CM | POA: Diagnosis not present

## 2020-06-02 DIAGNOSIS — Z20822 Contact with and (suspected) exposure to covid-19: Secondary | ICD-10-CM | POA: Diagnosis not present

## 2020-06-02 DIAGNOSIS — E119 Type 2 diabetes mellitus without complications: Secondary | ICD-10-CM | POA: Diagnosis not present

## 2020-06-02 DIAGNOSIS — R1013 Epigastric pain: Secondary | ICD-10-CM | POA: Diagnosis not present

## 2020-06-02 DIAGNOSIS — Z79899 Other long term (current) drug therapy: Secondary | ICD-10-CM | POA: Diagnosis not present

## 2020-06-02 DIAGNOSIS — Z7901 Long term (current) use of anticoagulants: Secondary | ICD-10-CM | POA: Diagnosis not present

## 2020-06-02 DIAGNOSIS — M549 Dorsalgia, unspecified: Secondary | ICD-10-CM | POA: Diagnosis not present

## 2020-06-02 DIAGNOSIS — R531 Weakness: Secondary | ICD-10-CM | POA: Diagnosis not present

## 2020-06-02 DIAGNOSIS — Z950 Presence of cardiac pacemaker: Secondary | ICD-10-CM | POA: Diagnosis not present

## 2020-06-03 DIAGNOSIS — M545 Low back pain, unspecified: Secondary | ICD-10-CM | POA: Diagnosis not present

## 2020-06-03 DIAGNOSIS — M546 Pain in thoracic spine: Secondary | ICD-10-CM | POA: Diagnosis not present

## 2020-06-03 DIAGNOSIS — Z952 Presence of prosthetic heart valve: Secondary | ICD-10-CM | POA: Diagnosis not present

## 2020-06-03 DIAGNOSIS — R531 Weakness: Secondary | ICD-10-CM | POA: Diagnosis not present

## 2020-06-04 DIAGNOSIS — M545 Low back pain, unspecified: Secondary | ICD-10-CM | POA: Diagnosis not present

## 2020-06-04 DIAGNOSIS — I517 Cardiomegaly: Secondary | ICD-10-CM | POA: Diagnosis not present

## 2020-06-04 DIAGNOSIS — M4854XA Collapsed vertebra, not elsewhere classified, thoracic region, initial encounter for fracture: Secondary | ICD-10-CM | POA: Diagnosis not present

## 2020-06-04 DIAGNOSIS — R54 Age-related physical debility: Secondary | ICD-10-CM | POA: Diagnosis not present

## 2020-06-04 DIAGNOSIS — I10 Essential (primary) hypertension: Secondary | ICD-10-CM | POA: Diagnosis not present

## 2020-06-04 DIAGNOSIS — M8580 Other specified disorders of bone density and structure, unspecified site: Secondary | ICD-10-CM | POA: Diagnosis not present

## 2020-06-04 DIAGNOSIS — J9811 Atelectasis: Secondary | ICD-10-CM | POA: Diagnosis not present

## 2020-06-05 DIAGNOSIS — Z952 Presence of prosthetic heart valve: Secondary | ICD-10-CM | POA: Diagnosis not present

## 2020-06-05 DIAGNOSIS — M546 Pain in thoracic spine: Secondary | ICD-10-CM | POA: Diagnosis not present

## 2020-06-05 DIAGNOSIS — I4821 Permanent atrial fibrillation: Secondary | ICD-10-CM | POA: Diagnosis not present

## 2020-06-05 DIAGNOSIS — E785 Hyperlipidemia, unspecified: Secondary | ICD-10-CM | POA: Diagnosis not present

## 2020-06-05 DIAGNOSIS — R54 Age-related physical debility: Secondary | ICD-10-CM | POA: Diagnosis not present

## 2020-06-05 DIAGNOSIS — M545 Low back pain, unspecified: Secondary | ICD-10-CM | POA: Diagnosis not present

## 2020-06-05 DIAGNOSIS — I10 Essential (primary) hypertension: Secondary | ICD-10-CM | POA: Diagnosis not present

## 2020-06-05 DIAGNOSIS — Z950 Presence of cardiac pacemaker: Secondary | ICD-10-CM | POA: Diagnosis not present

## 2020-06-06 DIAGNOSIS — E785 Hyperlipidemia, unspecified: Secondary | ICD-10-CM | POA: Diagnosis not present

## 2020-06-06 DIAGNOSIS — I10 Essential (primary) hypertension: Secondary | ICD-10-CM | POA: Diagnosis not present

## 2020-06-06 DIAGNOSIS — I4821 Permanent atrial fibrillation: Secondary | ICD-10-CM | POA: Diagnosis not present

## 2020-06-06 DIAGNOSIS — Z952 Presence of prosthetic heart valve: Secondary | ICD-10-CM | POA: Diagnosis not present

## 2020-06-06 DIAGNOSIS — R54 Age-related physical debility: Secondary | ICD-10-CM | POA: Diagnosis not present

## 2020-06-06 DIAGNOSIS — M545 Low back pain, unspecified: Secondary | ICD-10-CM | POA: Diagnosis not present

## 2020-06-06 DIAGNOSIS — Z950 Presence of cardiac pacemaker: Secondary | ICD-10-CM | POA: Diagnosis not present

## 2020-06-07 DIAGNOSIS — Z952 Presence of prosthetic heart valve: Secondary | ICD-10-CM | POA: Diagnosis not present

## 2020-06-07 DIAGNOSIS — Z950 Presence of cardiac pacemaker: Secondary | ICD-10-CM | POA: Diagnosis not present

## 2020-06-07 DIAGNOSIS — E785 Hyperlipidemia, unspecified: Secondary | ICD-10-CM | POA: Diagnosis not present

## 2020-06-07 DIAGNOSIS — I10 Essential (primary) hypertension: Secondary | ICD-10-CM | POA: Diagnosis not present

## 2020-06-07 DIAGNOSIS — R54 Age-related physical debility: Secondary | ICD-10-CM | POA: Diagnosis not present

## 2020-06-07 DIAGNOSIS — I4821 Permanent atrial fibrillation: Secondary | ICD-10-CM | POA: Diagnosis not present

## 2020-06-07 DIAGNOSIS — M545 Low back pain, unspecified: Secondary | ICD-10-CM | POA: Diagnosis not present

## 2020-06-08 DIAGNOSIS — I10 Essential (primary) hypertension: Secondary | ICD-10-CM | POA: Diagnosis not present

## 2020-06-08 DIAGNOSIS — I4821 Permanent atrial fibrillation: Secondary | ICD-10-CM | POA: Diagnosis not present

## 2020-06-08 DIAGNOSIS — E785 Hyperlipidemia, unspecified: Secondary | ICD-10-CM | POA: Diagnosis not present

## 2020-06-08 DIAGNOSIS — M545 Low back pain, unspecified: Secondary | ICD-10-CM | POA: Diagnosis not present

## 2020-06-08 DIAGNOSIS — Z952 Presence of prosthetic heart valve: Secondary | ICD-10-CM | POA: Diagnosis not present

## 2020-06-08 DIAGNOSIS — Z950 Presence of cardiac pacemaker: Secondary | ICD-10-CM | POA: Diagnosis not present

## 2020-06-08 DIAGNOSIS — R54 Age-related physical debility: Secondary | ICD-10-CM | POA: Diagnosis not present

## 2020-06-09 DIAGNOSIS — Z950 Presence of cardiac pacemaker: Secondary | ICD-10-CM | POA: Diagnosis not present

## 2020-06-09 DIAGNOSIS — Z952 Presence of prosthetic heart valve: Secondary | ICD-10-CM | POA: Diagnosis not present

## 2020-06-09 DIAGNOSIS — E785 Hyperlipidemia, unspecified: Secondary | ICD-10-CM | POA: Diagnosis not present

## 2020-06-09 DIAGNOSIS — M545 Low back pain, unspecified: Secondary | ICD-10-CM | POA: Diagnosis not present

## 2020-06-09 DIAGNOSIS — I10 Essential (primary) hypertension: Secondary | ICD-10-CM | POA: Diagnosis not present

## 2020-06-09 DIAGNOSIS — I4821 Permanent atrial fibrillation: Secondary | ICD-10-CM | POA: Diagnosis not present

## 2020-06-09 DIAGNOSIS — R54 Age-related physical debility: Secondary | ICD-10-CM | POA: Diagnosis not present

## 2020-06-10 DIAGNOSIS — I10 Essential (primary) hypertension: Secondary | ICD-10-CM | POA: Diagnosis not present

## 2020-06-10 DIAGNOSIS — M545 Low back pain, unspecified: Secondary | ICD-10-CM | POA: Diagnosis not present

## 2020-06-10 DIAGNOSIS — I4821 Permanent atrial fibrillation: Secondary | ICD-10-CM | POA: Diagnosis not present

## 2020-06-10 DIAGNOSIS — E785 Hyperlipidemia, unspecified: Secondary | ICD-10-CM | POA: Diagnosis not present

## 2020-06-10 DIAGNOSIS — Z952 Presence of prosthetic heart valve: Secondary | ICD-10-CM | POA: Diagnosis not present

## 2020-06-10 DIAGNOSIS — Z950 Presence of cardiac pacemaker: Secondary | ICD-10-CM | POA: Diagnosis not present

## 2020-06-10 DIAGNOSIS — R54 Age-related physical debility: Secondary | ICD-10-CM | POA: Diagnosis not present

## 2020-06-11 DIAGNOSIS — Z952 Presence of prosthetic heart valve: Secondary | ICD-10-CM | POA: Diagnosis not present

## 2020-06-11 DIAGNOSIS — M545 Low back pain, unspecified: Secondary | ICD-10-CM | POA: Diagnosis not present

## 2020-06-12 DIAGNOSIS — M545 Low back pain, unspecified: Secondary | ICD-10-CM | POA: Diagnosis not present

## 2020-06-12 DIAGNOSIS — Z952 Presence of prosthetic heart valve: Secondary | ICD-10-CM | POA: Diagnosis not present

## 2020-06-13 DIAGNOSIS — M545 Low back pain, unspecified: Secondary | ICD-10-CM | POA: Diagnosis not present

## 2020-06-13 DIAGNOSIS — Z952 Presence of prosthetic heart valve: Secondary | ICD-10-CM | POA: Diagnosis not present

## 2020-06-14 DIAGNOSIS — M545 Low back pain, unspecified: Secondary | ICD-10-CM | POA: Diagnosis not present

## 2020-06-14 DIAGNOSIS — Z952 Presence of prosthetic heart valve: Secondary | ICD-10-CM | POA: Diagnosis not present

## 2020-06-15 DIAGNOSIS — Z952 Presence of prosthetic heart valve: Secondary | ICD-10-CM | POA: Diagnosis not present

## 2020-06-15 DIAGNOSIS — M545 Low back pain, unspecified: Secondary | ICD-10-CM | POA: Diagnosis not present

## 2020-06-16 DIAGNOSIS — M545 Low back pain, unspecified: Secondary | ICD-10-CM | POA: Diagnosis not present

## 2020-06-16 DIAGNOSIS — Z952 Presence of prosthetic heart valve: Secondary | ICD-10-CM | POA: Diagnosis not present

## 2020-07-02 DIAGNOSIS — I10 Essential (primary) hypertension: Secondary | ICD-10-CM | POA: Diagnosis not present

## 2020-07-02 DIAGNOSIS — Z952 Presence of prosthetic heart valve: Secondary | ICD-10-CM | POA: Diagnosis not present

## 2020-07-02 DIAGNOSIS — I5032 Chronic diastolic (congestive) heart failure: Secondary | ICD-10-CM | POA: Diagnosis not present

## 2020-07-02 DIAGNOSIS — N813 Complete uterovaginal prolapse: Secondary | ICD-10-CM | POA: Diagnosis not present

## 2020-07-02 DIAGNOSIS — Z7689 Persons encountering health services in other specified circumstances: Secondary | ICD-10-CM | POA: Diagnosis not present

## 2020-07-02 DIAGNOSIS — Z789 Other specified health status: Secondary | ICD-10-CM | POA: Diagnosis not present

## 2020-07-02 DIAGNOSIS — R7303 Prediabetes: Secondary | ICD-10-CM | POA: Diagnosis not present

## 2020-07-02 DIAGNOSIS — D689 Coagulation defect, unspecified: Secondary | ICD-10-CM | POA: Diagnosis not present

## 2020-07-02 DIAGNOSIS — E7800 Pure hypercholesterolemia, unspecified: Secondary | ICD-10-CM | POA: Diagnosis not present

## 2020-07-02 DIAGNOSIS — S32020D Wedge compression fracture of second lumbar vertebra, subsequent encounter for fracture with routine healing: Secondary | ICD-10-CM | POA: Diagnosis not present

## 2020-07-02 DIAGNOSIS — Z7189 Other specified counseling: Secondary | ICD-10-CM | POA: Diagnosis not present

## 2020-07-02 DIAGNOSIS — R32 Unspecified urinary incontinence: Secondary | ICD-10-CM | POA: Diagnosis not present

## 2020-07-16 DIAGNOSIS — N8189 Other female genital prolapse: Secondary | ICD-10-CM | POA: Diagnosis not present

## 2020-07-16 DIAGNOSIS — Z4689 Encounter for fitting and adjustment of other specified devices: Secondary | ICD-10-CM | POA: Diagnosis not present

## 2020-07-20 DIAGNOSIS — N8189 Other female genital prolapse: Secondary | ICD-10-CM | POA: Diagnosis not present

## 2020-07-31 DIAGNOSIS — Z7901 Long term (current) use of anticoagulants: Secondary | ICD-10-CM | POA: Diagnosis not present

## 2020-08-03 DIAGNOSIS — R399 Unspecified symptoms and signs involving the genitourinary system: Secondary | ICD-10-CM | POA: Diagnosis not present

## 2020-08-03 DIAGNOSIS — N390 Urinary tract infection, site not specified: Secondary | ICD-10-CM | POA: Diagnosis not present

## 2020-08-14 DIAGNOSIS — N3000 Acute cystitis without hematuria: Secondary | ICD-10-CM | POA: Diagnosis not present

## 2020-08-17 DIAGNOSIS — I4821 Permanent atrial fibrillation: Secondary | ICD-10-CM | POA: Diagnosis not present

## 2020-08-17 DIAGNOSIS — I10 Essential (primary) hypertension: Secondary | ICD-10-CM | POA: Diagnosis not present

## 2020-08-17 DIAGNOSIS — Z45010 Encounter for checking and testing of cardiac pacemaker pulse generator [battery]: Secondary | ICD-10-CM | POA: Diagnosis not present

## 2020-08-17 DIAGNOSIS — R0789 Other chest pain: Secondary | ICD-10-CM | POA: Diagnosis not present

## 2020-08-17 DIAGNOSIS — Z952 Presence of prosthetic heart valve: Secondary | ICD-10-CM | POA: Diagnosis not present

## 2020-08-17 DIAGNOSIS — R42 Dizziness and giddiness: Secondary | ICD-10-CM | POA: Diagnosis not present

## 2020-08-18 DIAGNOSIS — Z45010 Encounter for checking and testing of cardiac pacemaker pulse generator [battery]: Secondary | ICD-10-CM | POA: Diagnosis not present

## 2020-08-28 DIAGNOSIS — R42 Dizziness and giddiness: Secondary | ICD-10-CM | POA: Diagnosis not present

## 2020-08-28 DIAGNOSIS — Z952 Presence of prosthetic heart valve: Secondary | ICD-10-CM | POA: Diagnosis not present

## 2020-09-18 DIAGNOSIS — Z7901 Long term (current) use of anticoagulants: Secondary | ICD-10-CM | POA: Diagnosis not present

## 2020-09-22 DIAGNOSIS — Z952 Presence of prosthetic heart valve: Secondary | ICD-10-CM | POA: Diagnosis not present

## 2020-09-22 DIAGNOSIS — Z7901 Long term (current) use of anticoagulants: Secondary | ICD-10-CM | POA: Diagnosis not present

## 2020-10-12 DIAGNOSIS — R300 Dysuria: Secondary | ICD-10-CM | POA: Diagnosis not present

## 2020-10-14 DIAGNOSIS — R338 Other retention of urine: Secondary | ICD-10-CM | POA: Diagnosis not present

## 2020-10-14 DIAGNOSIS — R339 Retention of urine, unspecified: Secondary | ICD-10-CM | POA: Diagnosis not present

## 2020-10-14 DIAGNOSIS — Z4689 Encounter for fitting and adjustment of other specified devices: Secondary | ICD-10-CM | POA: Diagnosis not present

## 2020-10-14 DIAGNOSIS — N8189 Other female genital prolapse: Secondary | ICD-10-CM | POA: Diagnosis not present

## 2020-10-16 DIAGNOSIS — E7800 Pure hypercholesterolemia, unspecified: Secondary | ICD-10-CM | POA: Diagnosis not present

## 2020-10-16 DIAGNOSIS — M542 Cervicalgia: Secondary | ICD-10-CM | POA: Diagnosis not present

## 2020-10-16 DIAGNOSIS — R922 Inconclusive mammogram: Secondary | ICD-10-CM | POA: Diagnosis not present

## 2020-10-16 DIAGNOSIS — R0600 Dyspnea, unspecified: Secondary | ICD-10-CM | POA: Diagnosis not present

## 2020-10-16 DIAGNOSIS — I517 Cardiomegaly: Secondary | ICD-10-CM | POA: Diagnosis not present

## 2020-10-16 DIAGNOSIS — R410 Disorientation, unspecified: Secondary | ICD-10-CM | POA: Diagnosis not present

## 2020-10-16 DIAGNOSIS — R071 Chest pain on breathing: Secondary | ICD-10-CM | POA: Diagnosis not present

## 2020-10-16 DIAGNOSIS — I4891 Unspecified atrial fibrillation: Secondary | ICD-10-CM | POA: Diagnosis not present

## 2020-10-16 DIAGNOSIS — Z7901 Long term (current) use of anticoagulants: Secondary | ICD-10-CM | POA: Diagnosis not present

## 2020-10-16 DIAGNOSIS — I10 Essential (primary) hypertension: Secondary | ICD-10-CM | POA: Diagnosis not present

## 2020-10-16 DIAGNOSIS — R921 Mammographic calcification found on diagnostic imaging of breast: Secondary | ICD-10-CM | POA: Diagnosis not present

## 2020-10-16 DIAGNOSIS — J90 Pleural effusion, not elsewhere classified: Secondary | ICD-10-CM | POA: Diagnosis not present

## 2020-10-16 DIAGNOSIS — E440 Moderate protein-calorie malnutrition: Secondary | ICD-10-CM | POA: Diagnosis not present

## 2020-10-16 DIAGNOSIS — R0989 Other specified symptoms and signs involving the circulatory and respiratory systems: Secondary | ICD-10-CM | POA: Diagnosis not present

## 2020-10-16 DIAGNOSIS — N63 Unspecified lump in unspecified breast: Secondary | ICD-10-CM | POA: Diagnosis not present

## 2020-10-16 DIAGNOSIS — N6321 Unspecified lump in the left breast, upper outer quadrant: Secondary | ICD-10-CM | POA: Diagnosis not present

## 2020-10-16 DIAGNOSIS — E119 Type 2 diabetes mellitus without complications: Secondary | ICD-10-CM | POA: Diagnosis not present

## 2020-10-16 DIAGNOSIS — R059 Cough, unspecified: Secondary | ICD-10-CM | POA: Diagnosis not present

## 2020-10-16 DIAGNOSIS — M549 Dorsalgia, unspecified: Secondary | ICD-10-CM | POA: Diagnosis not present

## 2020-10-16 DIAGNOSIS — G4700 Insomnia, unspecified: Secondary | ICD-10-CM | POA: Diagnosis not present

## 2020-10-16 DIAGNOSIS — I099 Rheumatic heart disease, unspecified: Secondary | ICD-10-CM | POA: Diagnosis not present

## 2020-10-16 DIAGNOSIS — S41102A Unspecified open wound of left upper arm, initial encounter: Secondary | ICD-10-CM | POA: Diagnosis not present

## 2020-10-16 DIAGNOSIS — J9811 Atelectasis: Secondary | ICD-10-CM | POA: Diagnosis not present

## 2020-10-16 DIAGNOSIS — E871 Hypo-osmolality and hyponatremia: Secondary | ICD-10-CM | POA: Diagnosis not present

## 2020-10-16 DIAGNOSIS — R0902 Hypoxemia: Secondary | ICD-10-CM | POA: Diagnosis not present

## 2020-10-16 DIAGNOSIS — M25552 Pain in left hip: Secondary | ICD-10-CM | POA: Diagnosis not present

## 2020-10-16 DIAGNOSIS — S22060D Wedge compression fracture of T7-T8 vertebra, subsequent encounter for fracture with routine healing: Secondary | ICD-10-CM | POA: Diagnosis not present

## 2020-10-16 DIAGNOSIS — D6832 Hemorrhagic disorder due to extrinsic circulating anticoagulants: Secondary | ICD-10-CM | POA: Diagnosis not present

## 2020-10-16 DIAGNOSIS — K5901 Slow transit constipation: Secondary | ICD-10-CM | POA: Diagnosis not present

## 2020-10-16 DIAGNOSIS — K5900 Constipation, unspecified: Secondary | ICD-10-CM | POA: Diagnosis not present

## 2020-10-16 DIAGNOSIS — W19XXXA Unspecified fall, initial encounter: Secondary | ICD-10-CM | POA: Diagnosis not present

## 2020-10-16 DIAGNOSIS — M25551 Pain in right hip: Secondary | ICD-10-CM | POA: Diagnosis not present

## 2020-10-16 DIAGNOSIS — D72828 Other elevated white blood cell count: Secondary | ICD-10-CM | POA: Diagnosis not present

## 2020-10-16 DIAGNOSIS — J9601 Acute respiratory failure with hypoxia: Secondary | ICD-10-CM | POA: Diagnosis not present

## 2020-10-16 DIAGNOSIS — S22060A Wedge compression fracture of T7-T8 vertebra, initial encounter for closed fracture: Secondary | ICD-10-CM | POA: Diagnosis not present

## 2020-10-16 DIAGNOSIS — I4821 Permanent atrial fibrillation: Secondary | ICD-10-CM | POA: Diagnosis not present

## 2020-10-16 DIAGNOSIS — Z743 Need for continuous supervision: Secondary | ICD-10-CM | POA: Diagnosis not present

## 2020-10-16 DIAGNOSIS — R0789 Other chest pain: Secondary | ICD-10-CM | POA: Diagnosis not present

## 2020-10-16 DIAGNOSIS — R079 Chest pain, unspecified: Secondary | ICD-10-CM | POA: Diagnosis not present

## 2020-10-16 DIAGNOSIS — C50912 Malignant neoplasm of unspecified site of left female breast: Secondary | ICD-10-CM | POA: Diagnosis not present

## 2020-10-16 DIAGNOSIS — E860 Dehydration: Secondary | ICD-10-CM | POA: Diagnosis not present

## 2020-10-16 DIAGNOSIS — Z48817 Encounter for surgical aftercare following surgery on the skin and subcutaneous tissue: Secondary | ICD-10-CM | POA: Diagnosis not present

## 2020-10-16 DIAGNOSIS — M25512 Pain in left shoulder: Secondary | ICD-10-CM | POA: Diagnosis not present

## 2020-10-16 DIAGNOSIS — I2721 Secondary pulmonary arterial hypertension: Secondary | ICD-10-CM | POA: Diagnosis not present

## 2020-10-16 DIAGNOSIS — S22050A Wedge compression fracture of T5-T6 vertebra, initial encounter for closed fracture: Secondary | ICD-10-CM | POA: Diagnosis not present

## 2020-10-16 DIAGNOSIS — W19XXXD Unspecified fall, subsequent encounter: Secondary | ICD-10-CM | POA: Diagnosis not present

## 2020-10-16 DIAGNOSIS — S299XXA Unspecified injury of thorax, initial encounter: Secondary | ICD-10-CM | POA: Diagnosis not present

## 2020-10-16 DIAGNOSIS — R791 Abnormal coagulation profile: Secondary | ICD-10-CM | POA: Diagnosis not present

## 2020-10-16 DIAGNOSIS — D500 Iron deficiency anemia secondary to blood loss (chronic): Secondary | ICD-10-CM | POA: Diagnosis not present

## 2020-10-16 DIAGNOSIS — Z950 Presence of cardiac pacemaker: Secondary | ICD-10-CM | POA: Diagnosis not present

## 2020-10-16 DIAGNOSIS — M810 Age-related osteoporosis without current pathological fracture: Secondary | ICD-10-CM | POA: Diagnosis not present

## 2020-10-16 DIAGNOSIS — Z66 Do not resuscitate: Secondary | ICD-10-CM | POA: Diagnosis not present

## 2020-10-16 DIAGNOSIS — R918 Other nonspecific abnormal finding of lung field: Secondary | ICD-10-CM | POA: Diagnosis not present

## 2020-10-16 DIAGNOSIS — E1151 Type 2 diabetes mellitus with diabetic peripheral angiopathy without gangrene: Secondary | ICD-10-CM | POA: Diagnosis not present

## 2020-10-16 DIAGNOSIS — N6459 Other signs and symptoms in breast: Secondary | ICD-10-CM | POA: Diagnosis not present

## 2020-10-16 DIAGNOSIS — M4850XA Collapsed vertebra, not elsewhere classified, site unspecified, initial encounter for fracture: Secondary | ICD-10-CM | POA: Diagnosis not present

## 2020-10-16 DIAGNOSIS — S40022D Contusion of left upper arm, subsequent encounter: Secondary | ICD-10-CM | POA: Diagnosis not present

## 2020-10-16 DIAGNOSIS — M25511 Pain in right shoulder: Secondary | ICD-10-CM | POA: Diagnosis not present

## 2020-10-16 DIAGNOSIS — I110 Hypertensive heart disease with heart failure: Secondary | ICD-10-CM | POA: Diagnosis not present

## 2020-10-16 DIAGNOSIS — D62 Acute posthemorrhagic anemia: Secondary | ICD-10-CM | POA: Diagnosis not present

## 2020-10-16 DIAGNOSIS — T45515A Adverse effect of anticoagulants, initial encounter: Secondary | ICD-10-CM | POA: Diagnosis not present

## 2020-10-16 DIAGNOSIS — G319 Degenerative disease of nervous system, unspecified: Secondary | ICD-10-CM | POA: Diagnosis not present

## 2020-10-16 DIAGNOSIS — M79622 Pain in left upper arm: Secondary | ICD-10-CM | POA: Diagnosis not present

## 2020-10-16 DIAGNOSIS — R531 Weakness: Secondary | ICD-10-CM | POA: Diagnosis not present

## 2020-10-16 DIAGNOSIS — M19012 Primary osteoarthritis, left shoulder: Secondary | ICD-10-CM | POA: Diagnosis not present

## 2020-10-16 DIAGNOSIS — S0990XA Unspecified injury of head, initial encounter: Secondary | ICD-10-CM | POA: Diagnosis not present

## 2020-10-16 DIAGNOSIS — Z7189 Other specified counseling: Secondary | ICD-10-CM | POA: Diagnosis not present

## 2020-10-16 DIAGNOSIS — Z952 Presence of prosthetic heart valve: Secondary | ICD-10-CM | POA: Diagnosis not present

## 2020-10-16 DIAGNOSIS — E785 Hyperlipidemia, unspecified: Secondary | ICD-10-CM | POA: Diagnosis not present

## 2020-10-16 DIAGNOSIS — N6320 Unspecified lump in the left breast, unspecified quadrant: Secondary | ICD-10-CM | POA: Diagnosis not present

## 2020-10-16 DIAGNOSIS — S40022A Contusion of left upper arm, initial encounter: Secondary | ICD-10-CM | POA: Diagnosis not present

## 2020-10-16 DIAGNOSIS — M19011 Primary osteoarthritis, right shoulder: Secondary | ICD-10-CM | POA: Diagnosis not present

## 2020-10-16 DIAGNOSIS — J189 Pneumonia, unspecified organism: Secondary | ICD-10-CM | POA: Diagnosis not present

## 2020-10-16 DIAGNOSIS — N179 Acute kidney failure, unspecified: Secondary | ICD-10-CM | POA: Diagnosis not present

## 2020-10-16 DIAGNOSIS — R404 Transient alteration of awareness: Secondary | ICD-10-CM | POA: Diagnosis not present

## 2020-10-16 DIAGNOSIS — M160 Bilateral primary osteoarthritis of hip: Secondary | ICD-10-CM | POA: Diagnosis not present

## 2020-10-16 DIAGNOSIS — M8008XA Age-related osteoporosis with current pathological fracture, vertebra(e), initial encounter for fracture: Secondary | ICD-10-CM | POA: Diagnosis not present

## 2020-10-16 DIAGNOSIS — I5032 Chronic diastolic (congestive) heart failure: Secondary | ICD-10-CM | POA: Diagnosis not present

## 2020-10-16 DIAGNOSIS — Z6821 Body mass index (BMI) 21.0-21.9, adult: Secondary | ICD-10-CM | POA: Diagnosis not present

## 2020-10-16 DIAGNOSIS — K219 Gastro-esophageal reflux disease without esophagitis: Secondary | ICD-10-CM | POA: Diagnosis not present

## 2020-10-16 DIAGNOSIS — D688 Other specified coagulation defects: Secondary | ICD-10-CM | POA: Diagnosis not present

## 2020-10-16 DIAGNOSIS — S199XXA Unspecified injury of neck, initial encounter: Secondary | ICD-10-CM | POA: Diagnosis not present

## 2020-10-16 DIAGNOSIS — Z20822 Contact with and (suspected) exposure to covid-19: Secondary | ICD-10-CM | POA: Diagnosis not present

## 2020-10-16 DIAGNOSIS — Z515 Encounter for palliative care: Secondary | ICD-10-CM | POA: Diagnosis not present

## 2020-10-16 DIAGNOSIS — I482 Chronic atrial fibrillation, unspecified: Secondary | ICD-10-CM | POA: Diagnosis not present

## 2020-10-17 DIAGNOSIS — M25512 Pain in left shoulder: Secondary | ICD-10-CM | POA: Diagnosis not present

## 2020-10-17 DIAGNOSIS — M19012 Primary osteoarthritis, left shoulder: Secondary | ICD-10-CM | POA: Diagnosis not present

## 2020-10-17 DIAGNOSIS — W19XXXA Unspecified fall, initial encounter: Secondary | ICD-10-CM | POA: Diagnosis not present

## 2020-10-17 DIAGNOSIS — S199XXA Unspecified injury of neck, initial encounter: Secondary | ICD-10-CM | POA: Diagnosis not present

## 2020-10-17 DIAGNOSIS — M542 Cervicalgia: Secondary | ICD-10-CM | POA: Diagnosis not present

## 2020-10-17 DIAGNOSIS — Z7901 Long term (current) use of anticoagulants: Secondary | ICD-10-CM | POA: Diagnosis not present

## 2020-10-17 DIAGNOSIS — M25511 Pain in right shoulder: Secondary | ICD-10-CM | POA: Diagnosis not present

## 2020-10-17 DIAGNOSIS — S22060A Wedge compression fracture of T7-T8 vertebra, initial encounter for closed fracture: Secondary | ICD-10-CM | POA: Diagnosis not present

## 2020-10-17 DIAGNOSIS — M19011 Primary osteoarthritis, right shoulder: Secondary | ICD-10-CM | POA: Diagnosis not present

## 2020-10-17 DIAGNOSIS — N6320 Unspecified lump in the left breast, unspecified quadrant: Secondary | ICD-10-CM | POA: Diagnosis not present

## 2020-10-17 DIAGNOSIS — G319 Degenerative disease of nervous system, unspecified: Secondary | ICD-10-CM | POA: Diagnosis not present

## 2020-10-17 DIAGNOSIS — S0990XA Unspecified injury of head, initial encounter: Secondary | ICD-10-CM | POA: Diagnosis not present

## 2020-10-17 DIAGNOSIS — N63 Unspecified lump in unspecified breast: Secondary | ICD-10-CM | POA: Diagnosis not present

## 2020-10-18 DIAGNOSIS — N63 Unspecified lump in unspecified breast: Secondary | ICD-10-CM | POA: Diagnosis not present

## 2020-10-18 DIAGNOSIS — I4891 Unspecified atrial fibrillation: Secondary | ICD-10-CM | POA: Diagnosis not present

## 2020-10-18 DIAGNOSIS — W19XXXA Unspecified fall, initial encounter: Secondary | ICD-10-CM | POA: Diagnosis not present

## 2020-10-18 DIAGNOSIS — N6320 Unspecified lump in the left breast, unspecified quadrant: Secondary | ICD-10-CM | POA: Diagnosis not present

## 2020-10-18 DIAGNOSIS — D688 Other specified coagulation defects: Secondary | ICD-10-CM | POA: Diagnosis not present

## 2020-10-18 DIAGNOSIS — S22060A Wedge compression fracture of T7-T8 vertebra, initial encounter for closed fracture: Secondary | ICD-10-CM | POA: Diagnosis not present

## 2020-10-18 DIAGNOSIS — R071 Chest pain on breathing: Secondary | ICD-10-CM | POA: Diagnosis not present

## 2020-10-18 DIAGNOSIS — M542 Cervicalgia: Secondary | ICD-10-CM | POA: Diagnosis not present

## 2020-10-19 DIAGNOSIS — N179 Acute kidney failure, unspecified: Secondary | ICD-10-CM | POA: Diagnosis not present

## 2020-10-19 DIAGNOSIS — S22060A Wedge compression fracture of T7-T8 vertebra, initial encounter for closed fracture: Secondary | ICD-10-CM | POA: Diagnosis not present

## 2020-10-19 DIAGNOSIS — N6321 Unspecified lump in the left breast, upper outer quadrant: Secondary | ICD-10-CM | POA: Diagnosis not present

## 2020-10-19 DIAGNOSIS — M4850XA Collapsed vertebra, not elsewhere classified, site unspecified, initial encounter for fracture: Secondary | ICD-10-CM | POA: Diagnosis not present

## 2020-10-19 DIAGNOSIS — D72828 Other elevated white blood cell count: Secondary | ICD-10-CM | POA: Diagnosis not present

## 2020-10-19 DIAGNOSIS — R071 Chest pain on breathing: Secondary | ICD-10-CM | POA: Diagnosis not present

## 2020-10-19 DIAGNOSIS — R922 Inconclusive mammogram: Secondary | ICD-10-CM | POA: Diagnosis not present

## 2020-10-19 DIAGNOSIS — D62 Acute posthemorrhagic anemia: Secondary | ICD-10-CM | POA: Diagnosis not present

## 2020-10-19 DIAGNOSIS — S40022A Contusion of left upper arm, initial encounter: Secondary | ICD-10-CM | POA: Diagnosis not present

## 2020-10-19 DIAGNOSIS — D688 Other specified coagulation defects: Secondary | ICD-10-CM | POA: Diagnosis not present

## 2020-10-19 DIAGNOSIS — N63 Unspecified lump in unspecified breast: Secondary | ICD-10-CM | POA: Diagnosis not present

## 2020-10-19 DIAGNOSIS — N6320 Unspecified lump in the left breast, unspecified quadrant: Secondary | ICD-10-CM | POA: Diagnosis not present

## 2020-10-19 DIAGNOSIS — N6459 Other signs and symptoms in breast: Secondary | ICD-10-CM | POA: Diagnosis not present

## 2020-10-19 DIAGNOSIS — R921 Mammographic calcification found on diagnostic imaging of breast: Secondary | ICD-10-CM | POA: Diagnosis not present

## 2020-10-20 DIAGNOSIS — S40022A Contusion of left upper arm, initial encounter: Secondary | ICD-10-CM | POA: Diagnosis not present

## 2020-10-20 DIAGNOSIS — Z7901 Long term (current) use of anticoagulants: Secondary | ICD-10-CM | POA: Diagnosis not present

## 2020-10-20 DIAGNOSIS — N6320 Unspecified lump in the left breast, unspecified quadrant: Secondary | ICD-10-CM | POA: Diagnosis not present

## 2020-10-20 DIAGNOSIS — S22060A Wedge compression fracture of T7-T8 vertebra, initial encounter for closed fracture: Secondary | ICD-10-CM | POA: Diagnosis not present

## 2020-10-20 DIAGNOSIS — R0600 Dyspnea, unspecified: Secondary | ICD-10-CM | POA: Diagnosis not present

## 2020-10-20 DIAGNOSIS — I517 Cardiomegaly: Secondary | ICD-10-CM | POA: Diagnosis not present

## 2020-10-20 DIAGNOSIS — Z950 Presence of cardiac pacemaker: Secondary | ICD-10-CM | POA: Diagnosis not present

## 2020-10-20 DIAGNOSIS — J189 Pneumonia, unspecified organism: Secondary | ICD-10-CM | POA: Diagnosis not present

## 2020-10-21 DIAGNOSIS — K5900 Constipation, unspecified: Secondary | ICD-10-CM | POA: Diagnosis not present

## 2020-10-21 DIAGNOSIS — J9601 Acute respiratory failure with hypoxia: Secondary | ICD-10-CM | POA: Diagnosis not present

## 2020-10-21 DIAGNOSIS — Z7189 Other specified counseling: Secondary | ICD-10-CM | POA: Diagnosis not present

## 2020-10-21 DIAGNOSIS — Z7901 Long term (current) use of anticoagulants: Secondary | ICD-10-CM | POA: Diagnosis not present

## 2020-10-21 DIAGNOSIS — J189 Pneumonia, unspecified organism: Secondary | ICD-10-CM | POA: Diagnosis not present

## 2020-10-21 DIAGNOSIS — N6320 Unspecified lump in the left breast, unspecified quadrant: Secondary | ICD-10-CM | POA: Diagnosis not present

## 2020-10-21 DIAGNOSIS — N179 Acute kidney failure, unspecified: Secondary | ICD-10-CM | POA: Diagnosis not present

## 2020-10-21 DIAGNOSIS — S22060A Wedge compression fracture of T7-T8 vertebra, initial encounter for closed fracture: Secondary | ICD-10-CM | POA: Diagnosis not present

## 2020-10-21 DIAGNOSIS — D500 Iron deficiency anemia secondary to blood loss (chronic): Secondary | ICD-10-CM | POA: Diagnosis not present

## 2020-10-21 DIAGNOSIS — Z66 Do not resuscitate: Secondary | ICD-10-CM | POA: Diagnosis not present

## 2020-10-21 DIAGNOSIS — R071 Chest pain on breathing: Secondary | ICD-10-CM | POA: Diagnosis not present

## 2020-10-21 DIAGNOSIS — S40022A Contusion of left upper arm, initial encounter: Secondary | ICD-10-CM | POA: Diagnosis not present

## 2020-10-22 DIAGNOSIS — Z7901 Long term (current) use of anticoagulants: Secondary | ICD-10-CM | POA: Diagnosis not present

## 2020-10-22 DIAGNOSIS — S22060A Wedge compression fracture of T7-T8 vertebra, initial encounter for closed fracture: Secondary | ICD-10-CM | POA: Diagnosis not present

## 2020-10-22 DIAGNOSIS — S40022A Contusion of left upper arm, initial encounter: Secondary | ICD-10-CM | POA: Diagnosis not present

## 2020-10-22 DIAGNOSIS — N6320 Unspecified lump in the left breast, unspecified quadrant: Secondary | ICD-10-CM | POA: Diagnosis not present

## 2020-10-22 DIAGNOSIS — J9601 Acute respiratory failure with hypoxia: Secondary | ICD-10-CM | POA: Diagnosis not present

## 2020-10-22 DIAGNOSIS — J189 Pneumonia, unspecified organism: Secondary | ICD-10-CM | POA: Diagnosis not present

## 2020-10-22 DIAGNOSIS — K5900 Constipation, unspecified: Secondary | ICD-10-CM | POA: Diagnosis not present

## 2020-10-22 DIAGNOSIS — N179 Acute kidney failure, unspecified: Secondary | ICD-10-CM | POA: Diagnosis not present

## 2020-10-22 DIAGNOSIS — Z66 Do not resuscitate: Secondary | ICD-10-CM | POA: Diagnosis not present

## 2020-10-22 DIAGNOSIS — R071 Chest pain on breathing: Secondary | ICD-10-CM | POA: Diagnosis not present

## 2020-10-22 DIAGNOSIS — D500 Iron deficiency anemia secondary to blood loss (chronic): Secondary | ICD-10-CM | POA: Diagnosis not present

## 2020-10-22 DIAGNOSIS — Z7189 Other specified counseling: Secondary | ICD-10-CM | POA: Diagnosis not present

## 2020-10-23 DIAGNOSIS — N179 Acute kidney failure, unspecified: Secondary | ICD-10-CM | POA: Diagnosis not present

## 2020-10-23 DIAGNOSIS — J189 Pneumonia, unspecified organism: Secondary | ICD-10-CM | POA: Diagnosis not present

## 2020-10-23 DIAGNOSIS — R079 Chest pain, unspecified: Secondary | ICD-10-CM | POA: Diagnosis not present

## 2020-10-23 DIAGNOSIS — K5900 Constipation, unspecified: Secondary | ICD-10-CM | POA: Diagnosis not present

## 2020-10-23 DIAGNOSIS — S22050A Wedge compression fracture of T5-T6 vertebra, initial encounter for closed fracture: Secondary | ICD-10-CM | POA: Diagnosis not present

## 2020-10-23 DIAGNOSIS — D500 Iron deficiency anemia secondary to blood loss (chronic): Secondary | ICD-10-CM | POA: Diagnosis not present

## 2020-10-23 DIAGNOSIS — Z7901 Long term (current) use of anticoagulants: Secondary | ICD-10-CM | POA: Diagnosis not present

## 2020-10-23 DIAGNOSIS — S40022A Contusion of left upper arm, initial encounter: Secondary | ICD-10-CM | POA: Diagnosis not present

## 2020-10-23 DIAGNOSIS — Z7189 Other specified counseling: Secondary | ICD-10-CM | POA: Diagnosis not present

## 2020-10-23 DIAGNOSIS — J9601 Acute respiratory failure with hypoxia: Secondary | ICD-10-CM | POA: Diagnosis not present

## 2020-10-23 DIAGNOSIS — Z515 Encounter for palliative care: Secondary | ICD-10-CM | POA: Diagnosis not present

## 2020-10-23 DIAGNOSIS — S22060A Wedge compression fracture of T7-T8 vertebra, initial encounter for closed fracture: Secondary | ICD-10-CM | POA: Diagnosis not present

## 2020-10-23 DIAGNOSIS — Z66 Do not resuscitate: Secondary | ICD-10-CM | POA: Diagnosis not present

## 2020-10-24 DIAGNOSIS — Z66 Do not resuscitate: Secondary | ICD-10-CM | POA: Diagnosis not present

## 2020-10-24 DIAGNOSIS — J9601 Acute respiratory failure with hypoxia: Secondary | ICD-10-CM | POA: Diagnosis not present

## 2020-10-24 DIAGNOSIS — S22060A Wedge compression fracture of T7-T8 vertebra, initial encounter for closed fracture: Secondary | ICD-10-CM | POA: Diagnosis not present

## 2020-10-24 DIAGNOSIS — I517 Cardiomegaly: Secondary | ICD-10-CM | POA: Diagnosis not present

## 2020-10-24 DIAGNOSIS — J189 Pneumonia, unspecified organism: Secondary | ICD-10-CM | POA: Diagnosis not present

## 2020-10-24 DIAGNOSIS — K5900 Constipation, unspecified: Secondary | ICD-10-CM | POA: Diagnosis not present

## 2020-10-24 DIAGNOSIS — Z7901 Long term (current) use of anticoagulants: Secondary | ICD-10-CM | POA: Diagnosis not present

## 2020-10-24 DIAGNOSIS — S40022A Contusion of left upper arm, initial encounter: Secondary | ICD-10-CM | POA: Diagnosis not present

## 2020-10-24 DIAGNOSIS — Z950 Presence of cardiac pacemaker: Secondary | ICD-10-CM | POA: Diagnosis not present

## 2020-10-24 DIAGNOSIS — D500 Iron deficiency anemia secondary to blood loss (chronic): Secondary | ICD-10-CM | POA: Diagnosis not present

## 2020-10-25 DIAGNOSIS — K5900 Constipation, unspecified: Secondary | ICD-10-CM | POA: Diagnosis not present

## 2020-10-25 DIAGNOSIS — Z66 Do not resuscitate: Secondary | ICD-10-CM | POA: Diagnosis not present

## 2020-10-25 DIAGNOSIS — D500 Iron deficiency anemia secondary to blood loss (chronic): Secondary | ICD-10-CM | POA: Diagnosis not present

## 2020-10-25 DIAGNOSIS — J189 Pneumonia, unspecified organism: Secondary | ICD-10-CM | POA: Diagnosis not present

## 2020-10-25 DIAGNOSIS — J9601 Acute respiratory failure with hypoxia: Secondary | ICD-10-CM | POA: Diagnosis not present

## 2020-10-25 DIAGNOSIS — S40022A Contusion of left upper arm, initial encounter: Secondary | ICD-10-CM | POA: Diagnosis not present

## 2020-10-25 DIAGNOSIS — S22060A Wedge compression fracture of T7-T8 vertebra, initial encounter for closed fracture: Secondary | ICD-10-CM | POA: Diagnosis not present

## 2020-10-25 DIAGNOSIS — Z7901 Long term (current) use of anticoagulants: Secondary | ICD-10-CM | POA: Diagnosis not present

## 2020-10-26 DIAGNOSIS — J189 Pneumonia, unspecified organism: Secondary | ICD-10-CM | POA: Diagnosis not present

## 2020-10-26 DIAGNOSIS — S40022A Contusion of left upper arm, initial encounter: Secondary | ICD-10-CM | POA: Diagnosis not present

## 2020-10-26 DIAGNOSIS — D500 Iron deficiency anemia secondary to blood loss (chronic): Secondary | ICD-10-CM | POA: Diagnosis not present

## 2020-10-26 DIAGNOSIS — Z66 Do not resuscitate: Secondary | ICD-10-CM | POA: Diagnosis not present

## 2020-10-26 DIAGNOSIS — J9601 Acute respiratory failure with hypoxia: Secondary | ICD-10-CM | POA: Diagnosis not present

## 2020-10-26 DIAGNOSIS — Z7901 Long term (current) use of anticoagulants: Secondary | ICD-10-CM | POA: Diagnosis not present

## 2020-10-26 DIAGNOSIS — S22060A Wedge compression fracture of T7-T8 vertebra, initial encounter for closed fracture: Secondary | ICD-10-CM | POA: Diagnosis not present

## 2020-10-26 DIAGNOSIS — K5900 Constipation, unspecified: Secondary | ICD-10-CM | POA: Diagnosis not present

## 2020-10-27 DIAGNOSIS — Z952 Presence of prosthetic heart valve: Secondary | ICD-10-CM | POA: Diagnosis not present

## 2020-10-28 DIAGNOSIS — D62 Acute posthemorrhagic anemia: Secondary | ICD-10-CM | POA: Diagnosis not present

## 2020-10-28 DIAGNOSIS — N6320 Unspecified lump in the left breast, unspecified quadrant: Secondary | ICD-10-CM | POA: Diagnosis not present

## 2020-10-28 DIAGNOSIS — J189 Pneumonia, unspecified organism: Secondary | ICD-10-CM | POA: Diagnosis not present

## 2020-10-28 DIAGNOSIS — R791 Abnormal coagulation profile: Secondary | ICD-10-CM | POA: Diagnosis not present

## 2020-10-28 DIAGNOSIS — J9601 Acute respiratory failure with hypoxia: Secondary | ICD-10-CM | POA: Diagnosis not present

## 2020-10-28 DIAGNOSIS — Z950 Presence of cardiac pacemaker: Secondary | ICD-10-CM | POA: Diagnosis not present

## 2020-10-28 DIAGNOSIS — R918 Other nonspecific abnormal finding of lung field: Secondary | ICD-10-CM | POA: Diagnosis not present

## 2020-10-28 DIAGNOSIS — I517 Cardiomegaly: Secondary | ICD-10-CM | POA: Diagnosis not present

## 2020-10-28 DIAGNOSIS — K5900 Constipation, unspecified: Secondary | ICD-10-CM | POA: Diagnosis not present

## 2020-10-29 DIAGNOSIS — R791 Abnormal coagulation profile: Secondary | ICD-10-CM | POA: Diagnosis not present

## 2020-10-29 DIAGNOSIS — J9601 Acute respiratory failure with hypoxia: Secondary | ICD-10-CM | POA: Diagnosis not present

## 2020-10-29 DIAGNOSIS — D62 Acute posthemorrhagic anemia: Secondary | ICD-10-CM | POA: Diagnosis not present

## 2020-10-29 DIAGNOSIS — K5900 Constipation, unspecified: Secondary | ICD-10-CM | POA: Diagnosis not present

## 2020-10-29 DIAGNOSIS — J189 Pneumonia, unspecified organism: Secondary | ICD-10-CM | POA: Diagnosis not present

## 2020-10-29 DIAGNOSIS — N6320 Unspecified lump in the left breast, unspecified quadrant: Secondary | ICD-10-CM | POA: Diagnosis not present

## 2020-10-30 DIAGNOSIS — N6320 Unspecified lump in the left breast, unspecified quadrant: Secondary | ICD-10-CM | POA: Diagnosis not present

## 2020-10-30 DIAGNOSIS — R791 Abnormal coagulation profile: Secondary | ICD-10-CM | POA: Diagnosis not present

## 2020-10-30 DIAGNOSIS — J189 Pneumonia, unspecified organism: Secondary | ICD-10-CM | POA: Diagnosis not present

## 2020-10-30 DIAGNOSIS — D62 Acute posthemorrhagic anemia: Secondary | ICD-10-CM | POA: Diagnosis not present

## 2020-10-30 DIAGNOSIS — K5900 Constipation, unspecified: Secondary | ICD-10-CM | POA: Diagnosis not present

## 2020-10-30 DIAGNOSIS — J9601 Acute respiratory failure with hypoxia: Secondary | ICD-10-CM | POA: Diagnosis not present

## 2020-10-31 DIAGNOSIS — S40022A Contusion of left upper arm, initial encounter: Secondary | ICD-10-CM | POA: Diagnosis not present

## 2020-10-31 DIAGNOSIS — E871 Hypo-osmolality and hyponatremia: Secondary | ICD-10-CM | POA: Diagnosis not present

## 2020-10-31 DIAGNOSIS — I5032 Chronic diastolic (congestive) heart failure: Secondary | ICD-10-CM | POA: Diagnosis not present

## 2020-10-31 DIAGNOSIS — Z7901 Long term (current) use of anticoagulants: Secondary | ICD-10-CM | POA: Diagnosis not present

## 2020-11-01 DIAGNOSIS — Z7901 Long term (current) use of anticoagulants: Secondary | ICD-10-CM | POA: Diagnosis not present

## 2020-11-01 DIAGNOSIS — I517 Cardiomegaly: Secondary | ICD-10-CM | POA: Diagnosis not present

## 2020-11-01 DIAGNOSIS — E871 Hypo-osmolality and hyponatremia: Secondary | ICD-10-CM | POA: Diagnosis not present

## 2020-11-01 DIAGNOSIS — J9811 Atelectasis: Secondary | ICD-10-CM | POA: Diagnosis not present

## 2020-11-01 DIAGNOSIS — R0902 Hypoxemia: Secondary | ICD-10-CM | POA: Diagnosis not present

## 2020-11-01 DIAGNOSIS — Z950 Presence of cardiac pacemaker: Secondary | ICD-10-CM | POA: Diagnosis not present

## 2020-11-01 DIAGNOSIS — S40022A Contusion of left upper arm, initial encounter: Secondary | ICD-10-CM | POA: Diagnosis not present

## 2020-11-01 DIAGNOSIS — I5032 Chronic diastolic (congestive) heart failure: Secondary | ICD-10-CM | POA: Diagnosis not present

## 2020-11-02 DIAGNOSIS — Z7901 Long term (current) use of anticoagulants: Secondary | ICD-10-CM | POA: Diagnosis not present

## 2020-11-02 DIAGNOSIS — I5032 Chronic diastolic (congestive) heart failure: Secondary | ICD-10-CM | POA: Diagnosis not present

## 2020-11-02 DIAGNOSIS — E871 Hypo-osmolality and hyponatremia: Secondary | ICD-10-CM | POA: Diagnosis not present

## 2020-11-02 DIAGNOSIS — M79622 Pain in left upper arm: Secondary | ICD-10-CM | POA: Diagnosis not present

## 2020-11-02 DIAGNOSIS — S40022A Contusion of left upper arm, initial encounter: Secondary | ICD-10-CM | POA: Diagnosis not present

## 2020-11-03 DIAGNOSIS — S40022A Contusion of left upper arm, initial encounter: Secondary | ICD-10-CM | POA: Diagnosis not present

## 2020-11-03 DIAGNOSIS — E871 Hypo-osmolality and hyponatremia: Secondary | ICD-10-CM | POA: Diagnosis not present

## 2020-11-03 DIAGNOSIS — Z7901 Long term (current) use of anticoagulants: Secondary | ICD-10-CM | POA: Diagnosis not present

## 2020-11-03 DIAGNOSIS — I5032 Chronic diastolic (congestive) heart failure: Secondary | ICD-10-CM | POA: Diagnosis not present

## 2020-11-03 DIAGNOSIS — S41102A Unspecified open wound of left upper arm, initial encounter: Secondary | ICD-10-CM | POA: Diagnosis not present

## 2020-11-03 DIAGNOSIS — Z952 Presence of prosthetic heart valve: Secondary | ICD-10-CM | POA: Diagnosis not present

## 2020-11-04 DIAGNOSIS — R059 Cough, unspecified: Secondary | ICD-10-CM | POA: Diagnosis not present

## 2020-11-04 DIAGNOSIS — Z950 Presence of cardiac pacemaker: Secondary | ICD-10-CM | POA: Diagnosis not present

## 2020-11-04 DIAGNOSIS — S40022A Contusion of left upper arm, initial encounter: Secondary | ICD-10-CM | POA: Diagnosis not present

## 2020-11-04 DIAGNOSIS — I10 Essential (primary) hypertension: Secondary | ICD-10-CM | POA: Diagnosis not present

## 2020-11-04 DIAGNOSIS — S40022D Contusion of left upper arm, subsequent encounter: Secondary | ICD-10-CM | POA: Diagnosis not present

## 2020-11-04 DIAGNOSIS — R918 Other nonspecific abnormal finding of lung field: Secondary | ICD-10-CM | POA: Diagnosis not present

## 2020-11-04 DIAGNOSIS — J90 Pleural effusion, not elsewhere classified: Secondary | ICD-10-CM | POA: Diagnosis not present

## 2020-11-04 DIAGNOSIS — Z952 Presence of prosthetic heart valve: Secondary | ICD-10-CM | POA: Diagnosis not present

## 2020-11-04 DIAGNOSIS — N6320 Unspecified lump in the left breast, unspecified quadrant: Secondary | ICD-10-CM | POA: Diagnosis not present

## 2020-11-04 DIAGNOSIS — Z7901 Long term (current) use of anticoagulants: Secondary | ICD-10-CM | POA: Diagnosis not present

## 2020-11-04 DIAGNOSIS — E871 Hypo-osmolality and hyponatremia: Secondary | ICD-10-CM | POA: Diagnosis not present

## 2020-11-05 DIAGNOSIS — E119 Type 2 diabetes mellitus without complications: Secondary | ICD-10-CM | POA: Diagnosis not present

## 2020-11-05 DIAGNOSIS — Z952 Presence of prosthetic heart valve: Secondary | ICD-10-CM | POA: Diagnosis not present

## 2020-11-05 DIAGNOSIS — E871 Hypo-osmolality and hyponatremia: Secondary | ICD-10-CM | POA: Diagnosis not present

## 2020-11-05 DIAGNOSIS — I10 Essential (primary) hypertension: Secondary | ICD-10-CM | POA: Diagnosis not present

## 2020-11-05 DIAGNOSIS — D688 Other specified coagulation defects: Secondary | ICD-10-CM | POA: Diagnosis not present

## 2020-11-05 DIAGNOSIS — R410 Disorientation, unspecified: Secondary | ICD-10-CM | POA: Diagnosis not present

## 2020-11-05 DIAGNOSIS — D62 Acute posthemorrhagic anemia: Secondary | ICD-10-CM | POA: Diagnosis not present

## 2020-11-05 DIAGNOSIS — S40022D Contusion of left upper arm, subsequent encounter: Secondary | ICD-10-CM | POA: Diagnosis not present

## 2020-11-05 DIAGNOSIS — E440 Moderate protein-calorie malnutrition: Secondary | ICD-10-CM | POA: Diagnosis not present

## 2020-11-05 DIAGNOSIS — N179 Acute kidney failure, unspecified: Secondary | ICD-10-CM | POA: Diagnosis not present

## 2020-11-05 DIAGNOSIS — J189 Pneumonia, unspecified organism: Secondary | ICD-10-CM | POA: Diagnosis not present

## 2020-11-05 DIAGNOSIS — S40022A Contusion of left upper arm, initial encounter: Secondary | ICD-10-CM | POA: Diagnosis not present

## 2020-11-06 DIAGNOSIS — E871 Hypo-osmolality and hyponatremia: Secondary | ICD-10-CM | POA: Diagnosis not present

## 2020-11-06 DIAGNOSIS — Z952 Presence of prosthetic heart valve: Secondary | ICD-10-CM | POA: Diagnosis not present

## 2020-11-06 DIAGNOSIS — S40022A Contusion of left upper arm, initial encounter: Secondary | ICD-10-CM | POA: Diagnosis not present

## 2020-11-06 DIAGNOSIS — N179 Acute kidney failure, unspecified: Secondary | ICD-10-CM | POA: Diagnosis not present

## 2020-11-06 DIAGNOSIS — I10 Essential (primary) hypertension: Secondary | ICD-10-CM | POA: Diagnosis not present

## 2020-11-06 DIAGNOSIS — E440 Moderate protein-calorie malnutrition: Secondary | ICD-10-CM | POA: Diagnosis not present

## 2020-11-06 DIAGNOSIS — D62 Acute posthemorrhagic anemia: Secondary | ICD-10-CM | POA: Diagnosis not present

## 2020-11-06 DIAGNOSIS — E119 Type 2 diabetes mellitus without complications: Secondary | ICD-10-CM | POA: Diagnosis not present

## 2020-11-06 DIAGNOSIS — R410 Disorientation, unspecified: Secondary | ICD-10-CM | POA: Diagnosis not present

## 2020-11-06 DIAGNOSIS — J189 Pneumonia, unspecified organism: Secondary | ICD-10-CM | POA: Diagnosis not present

## 2020-11-06 DIAGNOSIS — D688 Other specified coagulation defects: Secondary | ICD-10-CM | POA: Diagnosis not present

## 2020-11-07 DIAGNOSIS — J189 Pneumonia, unspecified organism: Secondary | ICD-10-CM | POA: Diagnosis not present

## 2020-11-07 DIAGNOSIS — E440 Moderate protein-calorie malnutrition: Secondary | ICD-10-CM | POA: Diagnosis not present

## 2020-11-07 DIAGNOSIS — R410 Disorientation, unspecified: Secondary | ICD-10-CM | POA: Diagnosis not present

## 2020-11-07 DIAGNOSIS — Z952 Presence of prosthetic heart valve: Secondary | ICD-10-CM | POA: Diagnosis not present

## 2020-11-07 DIAGNOSIS — E871 Hypo-osmolality and hyponatremia: Secondary | ICD-10-CM | POA: Diagnosis not present

## 2020-11-07 DIAGNOSIS — D62 Acute posthemorrhagic anemia: Secondary | ICD-10-CM | POA: Diagnosis not present

## 2020-11-07 DIAGNOSIS — S40022A Contusion of left upper arm, initial encounter: Secondary | ICD-10-CM | POA: Diagnosis not present

## 2020-11-07 DIAGNOSIS — I10 Essential (primary) hypertension: Secondary | ICD-10-CM | POA: Diagnosis not present

## 2020-11-07 DIAGNOSIS — E119 Type 2 diabetes mellitus without complications: Secondary | ICD-10-CM | POA: Diagnosis not present

## 2020-11-07 DIAGNOSIS — N179 Acute kidney failure, unspecified: Secondary | ICD-10-CM | POA: Diagnosis not present

## 2020-11-08 DIAGNOSIS — E871 Hypo-osmolality and hyponatremia: Secondary | ICD-10-CM | POA: Diagnosis not present

## 2020-11-08 DIAGNOSIS — E440 Moderate protein-calorie malnutrition: Secondary | ICD-10-CM | POA: Diagnosis not present

## 2020-11-08 DIAGNOSIS — E119 Type 2 diabetes mellitus without complications: Secondary | ICD-10-CM | POA: Diagnosis not present

## 2020-11-08 DIAGNOSIS — D62 Acute posthemorrhagic anemia: Secondary | ICD-10-CM | POA: Diagnosis not present

## 2020-11-08 DIAGNOSIS — Z952 Presence of prosthetic heart valve: Secondary | ICD-10-CM | POA: Diagnosis not present

## 2020-11-08 DIAGNOSIS — I10 Essential (primary) hypertension: Secondary | ICD-10-CM | POA: Diagnosis not present

## 2020-11-08 DIAGNOSIS — S40022A Contusion of left upper arm, initial encounter: Secondary | ICD-10-CM | POA: Diagnosis not present

## 2020-11-08 DIAGNOSIS — J189 Pneumonia, unspecified organism: Secondary | ICD-10-CM | POA: Diagnosis not present

## 2020-11-08 DIAGNOSIS — N179 Acute kidney failure, unspecified: Secondary | ICD-10-CM | POA: Diagnosis not present

## 2020-11-08 DIAGNOSIS — R410 Disorientation, unspecified: Secondary | ICD-10-CM | POA: Diagnosis not present

## 2020-11-09 DIAGNOSIS — Z952 Presence of prosthetic heart valve: Secondary | ICD-10-CM | POA: Diagnosis not present

## 2020-11-09 DIAGNOSIS — E119 Type 2 diabetes mellitus without complications: Secondary | ICD-10-CM | POA: Diagnosis not present

## 2020-11-09 DIAGNOSIS — J189 Pneumonia, unspecified organism: Secondary | ICD-10-CM | POA: Diagnosis not present

## 2020-11-09 DIAGNOSIS — I10 Essential (primary) hypertension: Secondary | ICD-10-CM | POA: Diagnosis not present

## 2020-11-09 DIAGNOSIS — S40022A Contusion of left upper arm, initial encounter: Secondary | ICD-10-CM | POA: Diagnosis not present

## 2020-11-09 DIAGNOSIS — D62 Acute posthemorrhagic anemia: Secondary | ICD-10-CM | POA: Diagnosis not present

## 2020-11-09 DIAGNOSIS — R410 Disorientation, unspecified: Secondary | ICD-10-CM | POA: Diagnosis not present

## 2020-11-09 DIAGNOSIS — E871 Hypo-osmolality and hyponatremia: Secondary | ICD-10-CM | POA: Diagnosis not present

## 2020-11-09 DIAGNOSIS — E440 Moderate protein-calorie malnutrition: Secondary | ICD-10-CM | POA: Diagnosis not present

## 2020-11-09 DIAGNOSIS — N179 Acute kidney failure, unspecified: Secondary | ICD-10-CM | POA: Diagnosis not present

## 2020-11-10 DIAGNOSIS — I10 Essential (primary) hypertension: Secondary | ICD-10-CM | POA: Diagnosis not present

## 2020-11-10 DIAGNOSIS — J189 Pneumonia, unspecified organism: Secondary | ICD-10-CM | POA: Diagnosis not present

## 2020-11-10 DIAGNOSIS — N179 Acute kidney failure, unspecified: Secondary | ICD-10-CM | POA: Diagnosis not present

## 2020-11-10 DIAGNOSIS — Z952 Presence of prosthetic heart valve: Secondary | ICD-10-CM | POA: Diagnosis not present

## 2020-11-10 DIAGNOSIS — D62 Acute posthemorrhagic anemia: Secondary | ICD-10-CM | POA: Diagnosis not present

## 2020-11-10 DIAGNOSIS — E440 Moderate protein-calorie malnutrition: Secondary | ICD-10-CM | POA: Diagnosis not present

## 2020-11-10 DIAGNOSIS — S40022A Contusion of left upper arm, initial encounter: Secondary | ICD-10-CM | POA: Diagnosis not present

## 2020-11-10 DIAGNOSIS — R410 Disorientation, unspecified: Secondary | ICD-10-CM | POA: Diagnosis not present

## 2020-11-10 DIAGNOSIS — E871 Hypo-osmolality and hyponatremia: Secondary | ICD-10-CM | POA: Diagnosis not present

## 2020-11-10 DIAGNOSIS — E119 Type 2 diabetes mellitus without complications: Secondary | ICD-10-CM | POA: Diagnosis not present

## 2020-11-10 DIAGNOSIS — S41102A Unspecified open wound of left upper arm, initial encounter: Secondary | ICD-10-CM | POA: Diagnosis not present

## 2020-11-11 DIAGNOSIS — R410 Disorientation, unspecified: Secondary | ICD-10-CM | POA: Diagnosis not present

## 2020-11-11 DIAGNOSIS — Z952 Presence of prosthetic heart valve: Secondary | ICD-10-CM | POA: Diagnosis not present

## 2020-11-11 DIAGNOSIS — S40022A Contusion of left upper arm, initial encounter: Secondary | ICD-10-CM | POA: Diagnosis not present

## 2020-11-11 DIAGNOSIS — Z20822 Contact with and (suspected) exposure to covid-19: Secondary | ICD-10-CM | POA: Diagnosis not present

## 2020-11-12 DIAGNOSIS — S41102A Unspecified open wound of left upper arm, initial encounter: Secondary | ICD-10-CM | POA: Diagnosis not present

## 2020-11-12 DIAGNOSIS — Z952 Presence of prosthetic heart valve: Secondary | ICD-10-CM | POA: Diagnosis not present

## 2020-11-12 DIAGNOSIS — R410 Disorientation, unspecified: Secondary | ICD-10-CM | POA: Diagnosis not present

## 2020-11-12 DIAGNOSIS — Z20822 Contact with and (suspected) exposure to covid-19: Secondary | ICD-10-CM | POA: Diagnosis not present

## 2020-11-12 DIAGNOSIS — S40022A Contusion of left upper arm, initial encounter: Secondary | ICD-10-CM | POA: Diagnosis not present

## 2020-11-13 DIAGNOSIS — I4891 Unspecified atrial fibrillation: Secondary | ICD-10-CM | POA: Diagnosis not present

## 2020-11-13 DIAGNOSIS — S065X9 Traumatic subdural hemorrhage with loss of consciousness of unspecified duration: Secondary | ICD-10-CM | POA: Diagnosis not present

## 2020-11-13 DIAGNOSIS — S0990XA Unspecified injury of head, initial encounter: Secondary | ICD-10-CM | POA: Diagnosis not present

## 2020-11-13 DIAGNOSIS — I4821 Permanent atrial fibrillation: Secondary | ICD-10-CM | POA: Diagnosis not present

## 2020-11-13 DIAGNOSIS — R531 Weakness: Secondary | ICD-10-CM | POA: Diagnosis not present

## 2020-11-13 DIAGNOSIS — S40022D Contusion of left upper arm, subsequent encounter: Secondary | ICD-10-CM | POA: Diagnosis not present

## 2020-11-13 DIAGNOSIS — R0789 Other chest pain: Secondary | ICD-10-CM | POA: Diagnosis not present

## 2020-11-13 DIAGNOSIS — R404 Transient alteration of awareness: Secondary | ICD-10-CM | POA: Diagnosis not present

## 2020-11-13 DIAGNOSIS — R000 Tachycardia, unspecified: Secondary | ICD-10-CM | POA: Diagnosis not present

## 2020-11-13 DIAGNOSIS — Z950 Presence of cardiac pacemaker: Secondary | ICD-10-CM | POA: Diagnosis not present

## 2020-11-13 DIAGNOSIS — F32A Depression, unspecified: Secondary | ICD-10-CM | POA: Diagnosis not present

## 2020-11-13 DIAGNOSIS — R402252 Coma scale, best verbal response, oriented, at arrival to emergency department: Secondary | ICD-10-CM | POA: Diagnosis not present

## 2020-11-13 DIAGNOSIS — E089 Diabetes mellitus due to underlying condition without complications: Secondary | ICD-10-CM | POA: Diagnosis not present

## 2020-11-13 DIAGNOSIS — K5900 Constipation, unspecified: Secondary | ICD-10-CM | POA: Diagnosis not present

## 2020-11-13 DIAGNOSIS — R0689 Other abnormalities of breathing: Secondary | ICD-10-CM | POA: Diagnosis not present

## 2020-11-13 DIAGNOSIS — W19XXXD Unspecified fall, subsequent encounter: Secondary | ICD-10-CM | POA: Diagnosis not present

## 2020-11-13 DIAGNOSIS — E119 Type 2 diabetes mellitus without complications: Secondary | ICD-10-CM | POA: Diagnosis not present

## 2020-11-13 DIAGNOSIS — M810 Age-related osteoporosis without current pathological fracture: Secondary | ICD-10-CM | POA: Diagnosis not present

## 2020-11-13 DIAGNOSIS — Z885 Allergy status to narcotic agent status: Secondary | ICD-10-CM | POA: Diagnosis not present

## 2020-11-13 DIAGNOSIS — S065X9A Traumatic subdural hemorrhage with loss of consciousness of unspecified duration, initial encounter: Secondary | ICD-10-CM | POA: Diagnosis not present

## 2020-11-13 DIAGNOSIS — Z48817 Encounter for surgical aftercare following surgery on the skin and subcutaneous tissue: Secondary | ICD-10-CM | POA: Diagnosis not present

## 2020-11-13 DIAGNOSIS — Z743 Need for continuous supervision: Secondary | ICD-10-CM | POA: Diagnosis not present

## 2020-11-13 DIAGNOSIS — S066X0A Traumatic subarachnoid hemorrhage without loss of consciousness, initial encounter: Secondary | ICD-10-CM | POA: Diagnosis not present

## 2020-11-13 DIAGNOSIS — I4811 Longstanding persistent atrial fibrillation: Secondary | ICD-10-CM | POA: Diagnosis not present

## 2020-11-13 DIAGNOSIS — Z0000 Encounter for general adult medical examination without abnormal findings: Secondary | ICD-10-CM | POA: Diagnosis not present

## 2020-11-13 DIAGNOSIS — I482 Chronic atrial fibrillation, unspecified: Secondary | ICD-10-CM | POA: Diagnosis not present

## 2020-11-13 DIAGNOSIS — S22060D Wedge compression fracture of T7-T8 vertebra, subsequent encounter for fracture with routine healing: Secondary | ICD-10-CM | POA: Diagnosis not present

## 2020-11-13 DIAGNOSIS — S40022A Contusion of left upper arm, initial encounter: Secondary | ICD-10-CM | POA: Diagnosis not present

## 2020-11-13 DIAGNOSIS — Z20822 Contact with and (suspected) exposure to covid-19: Secondary | ICD-10-CM | POA: Diagnosis not present

## 2020-11-13 DIAGNOSIS — K219 Gastro-esophageal reflux disease without esophagitis: Secondary | ICD-10-CM | POA: Diagnosis not present

## 2020-11-13 DIAGNOSIS — R58 Hemorrhage, not elsewhere classified: Secondary | ICD-10-CM | POA: Diagnosis not present

## 2020-11-13 DIAGNOSIS — R410 Disorientation, unspecified: Secondary | ICD-10-CM | POA: Diagnosis not present

## 2020-11-13 DIAGNOSIS — Z7901 Long term (current) use of anticoagulants: Secondary | ICD-10-CM | POA: Diagnosis not present

## 2020-11-13 DIAGNOSIS — Z66 Do not resuscitate: Secondary | ICD-10-CM | POA: Diagnosis not present

## 2020-11-13 DIAGNOSIS — I099 Rheumatic heart disease, unspecified: Secondary | ICD-10-CM | POA: Diagnosis not present

## 2020-11-13 DIAGNOSIS — G4700 Insomnia, unspecified: Secondary | ICD-10-CM | POA: Diagnosis not present

## 2020-11-13 DIAGNOSIS — G935 Compression of brain: Secondary | ICD-10-CM | POA: Diagnosis not present

## 2020-11-13 DIAGNOSIS — F419 Anxiety disorder, unspecified: Secondary | ICD-10-CM | POA: Diagnosis not present

## 2020-11-13 DIAGNOSIS — S065X0A Traumatic subdural hemorrhage without loss of consciousness, initial encounter: Secondary | ICD-10-CM | POA: Diagnosis not present

## 2020-11-13 DIAGNOSIS — C50912 Malignant neoplasm of unspecified site of left female breast: Secondary | ICD-10-CM | POA: Diagnosis not present

## 2020-11-13 DIAGNOSIS — R402362 Coma scale, best motor response, obeys commands, at arrival to emergency department: Secondary | ICD-10-CM | POA: Diagnosis not present

## 2020-11-13 DIAGNOSIS — I10 Essential (primary) hypertension: Secondary | ICD-10-CM | POA: Diagnosis not present

## 2020-11-13 DIAGNOSIS — I959 Hypotension, unspecified: Secondary | ICD-10-CM | POA: Diagnosis not present

## 2020-11-13 DIAGNOSIS — R791 Abnormal coagulation profile: Secondary | ICD-10-CM | POA: Diagnosis not present

## 2020-11-13 DIAGNOSIS — E785 Hyperlipidemia, unspecified: Secondary | ICD-10-CM | POA: Diagnosis not present

## 2020-11-13 DIAGNOSIS — I110 Hypertensive heart disease with heart failure: Secondary | ICD-10-CM | POA: Diagnosis not present

## 2020-11-13 DIAGNOSIS — Z515 Encounter for palliative care: Secondary | ICD-10-CM | POA: Diagnosis not present

## 2020-11-13 DIAGNOSIS — I5032 Chronic diastolic (congestive) heart failure: Secondary | ICD-10-CM | POA: Diagnosis not present

## 2020-11-13 DIAGNOSIS — Z952 Presence of prosthetic heart valve: Secondary | ICD-10-CM | POA: Diagnosis not present

## 2020-11-13 DIAGNOSIS — R402142 Coma scale, eyes open, spontaneous, at arrival to emergency department: Secondary | ICD-10-CM | POA: Diagnosis not present

## 2020-11-16 DIAGNOSIS — I10 Essential (primary) hypertension: Secondary | ICD-10-CM | POA: Diagnosis not present

## 2020-11-16 DIAGNOSIS — F419 Anxiety disorder, unspecified: Secondary | ICD-10-CM | POA: Diagnosis not present

## 2020-11-16 DIAGNOSIS — S22060D Wedge compression fracture of T7-T8 vertebra, subsequent encounter for fracture with routine healing: Secondary | ICD-10-CM | POA: Diagnosis not present

## 2020-11-16 DIAGNOSIS — S40022D Contusion of left upper arm, subsequent encounter: Secondary | ICD-10-CM | POA: Diagnosis not present

## 2020-11-19 DIAGNOSIS — S22060D Wedge compression fracture of T7-T8 vertebra, subsequent encounter for fracture with routine healing: Secondary | ICD-10-CM | POA: Diagnosis not present

## 2020-11-19 DIAGNOSIS — I482 Chronic atrial fibrillation, unspecified: Secondary | ICD-10-CM | POA: Diagnosis not present

## 2020-11-19 DIAGNOSIS — K5900 Constipation, unspecified: Secondary | ICD-10-CM | POA: Diagnosis not present

## 2020-11-19 DIAGNOSIS — R531 Weakness: Secondary | ICD-10-CM | POA: Diagnosis not present

## 2020-11-19 DIAGNOSIS — S40022D Contusion of left upper arm, subsequent encounter: Secondary | ICD-10-CM | POA: Diagnosis not present

## 2020-11-25 DIAGNOSIS — S22060D Wedge compression fracture of T7-T8 vertebra, subsequent encounter for fracture with routine healing: Secondary | ICD-10-CM | POA: Diagnosis not present

## 2020-11-25 DIAGNOSIS — R531 Weakness: Secondary | ICD-10-CM | POA: Diagnosis not present

## 2020-11-25 DIAGNOSIS — I482 Chronic atrial fibrillation, unspecified: Secondary | ICD-10-CM | POA: Diagnosis not present

## 2020-11-25 DIAGNOSIS — S40022D Contusion of left upper arm, subsequent encounter: Secondary | ICD-10-CM | POA: Diagnosis not present

## 2020-11-26 DIAGNOSIS — Z7901 Long term (current) use of anticoagulants: Secondary | ICD-10-CM | POA: Diagnosis not present

## 2020-11-30 DIAGNOSIS — I482 Chronic atrial fibrillation, unspecified: Secondary | ICD-10-CM | POA: Diagnosis not present

## 2020-11-30 DIAGNOSIS — R531 Weakness: Secondary | ICD-10-CM | POA: Diagnosis not present

## 2020-11-30 DIAGNOSIS — S40022D Contusion of left upper arm, subsequent encounter: Secondary | ICD-10-CM | POA: Diagnosis not present

## 2020-11-30 DIAGNOSIS — Z7901 Long term (current) use of anticoagulants: Secondary | ICD-10-CM | POA: Diagnosis not present

## 2020-12-02 DIAGNOSIS — S22060D Wedge compression fracture of T7-T8 vertebra, subsequent encounter for fracture with routine healing: Secondary | ICD-10-CM | POA: Diagnosis not present

## 2020-12-02 DIAGNOSIS — R531 Weakness: Secondary | ICD-10-CM | POA: Diagnosis not present

## 2020-12-02 DIAGNOSIS — S40022D Contusion of left upper arm, subsequent encounter: Secondary | ICD-10-CM | POA: Diagnosis not present

## 2020-12-03 DIAGNOSIS — Z7901 Long term (current) use of anticoagulants: Secondary | ICD-10-CM | POA: Diagnosis not present

## 2020-12-07 DIAGNOSIS — Z7901 Long term (current) use of anticoagulants: Secondary | ICD-10-CM | POA: Diagnosis not present

## 2020-12-07 DIAGNOSIS — F32A Depression, unspecified: Secondary | ICD-10-CM | POA: Diagnosis not present

## 2020-12-07 DIAGNOSIS — R531 Weakness: Secondary | ICD-10-CM | POA: Diagnosis not present

## 2020-12-07 DIAGNOSIS — S40022D Contusion of left upper arm, subsequent encounter: Secondary | ICD-10-CM | POA: Diagnosis not present

## 2020-12-09 DIAGNOSIS — F419 Anxiety disorder, unspecified: Secondary | ICD-10-CM | POA: Diagnosis not present

## 2020-12-09 DIAGNOSIS — R0789 Other chest pain: Secondary | ICD-10-CM | POA: Diagnosis not present

## 2020-12-09 DIAGNOSIS — W19XXXD Unspecified fall, subsequent encounter: Secondary | ICD-10-CM | POA: Diagnosis not present

## 2020-12-09 DIAGNOSIS — I4891 Unspecified atrial fibrillation: Secondary | ICD-10-CM | POA: Diagnosis not present

## 2020-12-09 DIAGNOSIS — R402252 Coma scale, best verbal response, oriented, at arrival to emergency department: Secondary | ICD-10-CM | POA: Diagnosis not present

## 2020-12-09 DIAGNOSIS — S066X0A Traumatic subarachnoid hemorrhage without loss of consciousness, initial encounter: Secondary | ICD-10-CM | POA: Diagnosis not present

## 2020-12-09 DIAGNOSIS — R0689 Other abnormalities of breathing: Secondary | ICD-10-CM | POA: Diagnosis not present

## 2020-12-09 DIAGNOSIS — S0990XA Unspecified injury of head, initial encounter: Secondary | ICD-10-CM | POA: Diagnosis not present

## 2020-12-09 DIAGNOSIS — W19XXXA Unspecified fall, initial encounter: Secondary | ICD-10-CM | POA: Diagnosis not present

## 2020-12-09 DIAGNOSIS — Z885 Allergy status to narcotic agent status: Secondary | ICD-10-CM | POA: Diagnosis not present

## 2020-12-09 DIAGNOSIS — Z952 Presence of prosthetic heart valve: Secondary | ICD-10-CM | POA: Diagnosis not present

## 2020-12-09 DIAGNOSIS — R000 Tachycardia, unspecified: Secondary | ICD-10-CM | POA: Diagnosis not present

## 2020-12-09 DIAGNOSIS — S065X9 Traumatic subdural hemorrhage with loss of consciousness of unspecified duration: Secondary | ICD-10-CM | POA: Diagnosis not present

## 2020-12-09 DIAGNOSIS — I959 Hypotension, unspecified: Secondary | ICD-10-CM | POA: Diagnosis not present

## 2020-12-09 DIAGNOSIS — Z7901 Long term (current) use of anticoagulants: Secondary | ICD-10-CM | POA: Diagnosis not present

## 2020-12-09 DIAGNOSIS — R402142 Coma scale, eyes open, spontaneous, at arrival to emergency department: Secondary | ICD-10-CM | POA: Diagnosis not present

## 2020-12-09 DIAGNOSIS — S065X9A Traumatic subdural hemorrhage with loss of consciousness of unspecified duration, initial encounter: Secondary | ICD-10-CM | POA: Diagnosis not present

## 2020-12-09 DIAGNOSIS — Z950 Presence of cardiac pacemaker: Secondary | ICD-10-CM | POA: Diagnosis not present

## 2020-12-09 DIAGNOSIS — Z66 Do not resuscitate: Secondary | ICD-10-CM | POA: Diagnosis not present

## 2020-12-09 DIAGNOSIS — Z20822 Contact with and (suspected) exposure to covid-19: Secondary | ICD-10-CM | POA: Diagnosis not present

## 2020-12-09 DIAGNOSIS — G935 Compression of brain: Secondary | ICD-10-CM | POA: Diagnosis not present

## 2020-12-09 DIAGNOSIS — S066X9A Traumatic subarachnoid hemorrhage with loss of consciousness of unspecified duration, initial encounter: Secondary | ICD-10-CM | POA: Diagnosis not present

## 2020-12-09 DIAGNOSIS — Z0000 Encounter for general adult medical examination without abnormal findings: Secondary | ICD-10-CM | POA: Diagnosis not present

## 2020-12-09 DIAGNOSIS — Z515 Encounter for palliative care: Secondary | ICD-10-CM | POA: Diagnosis not present

## 2020-12-09 DIAGNOSIS — R402362 Coma scale, best motor response, obeys commands, at arrival to emergency department: Secondary | ICD-10-CM | POA: Diagnosis not present

## 2020-12-09 DIAGNOSIS — R791 Abnormal coagulation profile: Secondary | ICD-10-CM | POA: Diagnosis not present

## 2020-12-09 DIAGNOSIS — S065X0A Traumatic subdural hemorrhage without loss of consciousness, initial encounter: Secondary | ICD-10-CM | POA: Diagnosis not present

## 2020-12-09 DIAGNOSIS — R58 Hemorrhage, not elsewhere classified: Secondary | ICD-10-CM | POA: Diagnosis not present

## 2020-12-25 DEATH — deceased

## 2023-05-01 ENCOUNTER — Encounter (HOSPITAL_COMMUNITY): Payer: Self-pay | Admitting: Internal Medicine
